# Patient Record
Sex: Male | Born: 1965 | Race: Black or African American | Hispanic: No | Marital: Married | Smoking: Never smoker
Health system: Southern US, Community
[De-identification: ages and names within clinical notes are randomized; demographics above are authoritative.]

## PROBLEM LIST (undated history)

## (undated) DIAGNOSIS — IMO0002 Reserved for concepts with insufficient information to code with codable children: Secondary | ICD-10-CM

## (undated) DIAGNOSIS — G4733 Obstructive sleep apnea (adult) (pediatric): Secondary | ICD-10-CM

## (undated) DIAGNOSIS — K625 Hemorrhage of anus and rectum: Secondary | ICD-10-CM

## (undated) DIAGNOSIS — M7540 Impingement syndrome of unspecified shoulder: Secondary | ICD-10-CM

## (undated) DIAGNOSIS — T4145XA Adverse effect of unspecified anesthetic, initial encounter: Secondary | ICD-10-CM

## (undated) DIAGNOSIS — Z973 Presence of spectacles and contact lenses: Secondary | ICD-10-CM

## (undated) DIAGNOSIS — G479 Sleep disorder, unspecified: Secondary | ICD-10-CM

## (undated) DIAGNOSIS — M1990 Unspecified osteoarthritis, unspecified site: Secondary | ICD-10-CM

## (undated) DIAGNOSIS — S838X9A Sprain of other specified parts of unspecified knee, initial encounter: Secondary | ICD-10-CM

## (undated) DIAGNOSIS — T8859XA Other complications of anesthesia, initial encounter: Secondary | ICD-10-CM

## (undated) DIAGNOSIS — M25819 Other specified joint disorders, unspecified shoulder: Secondary | ICD-10-CM

## (undated) DIAGNOSIS — F32A Depression, unspecified: Secondary | ICD-10-CM

## (undated) DIAGNOSIS — F329 Major depressive disorder, single episode, unspecified: Secondary | ICD-10-CM

## (undated) DIAGNOSIS — F4310 Post-traumatic stress disorder, unspecified: Secondary | ICD-10-CM

## (undated) DIAGNOSIS — G4730 Sleep apnea, unspecified: Secondary | ICD-10-CM

## (undated) DIAGNOSIS — K219 Gastro-esophageal reflux disease without esophagitis: Secondary | ICD-10-CM

## (undated) DIAGNOSIS — K4090 Unilateral inguinal hernia, without obstruction or gangrene, not specified as recurrent: Secondary | ICD-10-CM

## (undated) DIAGNOSIS — F419 Anxiety disorder, unspecified: Secondary | ICD-10-CM

## (undated) DIAGNOSIS — G4700 Insomnia, unspecified: Secondary | ICD-10-CM

## (undated) DIAGNOSIS — T1490XA Injury, unspecified, initial encounter: Secondary | ICD-10-CM

## (undated) DIAGNOSIS — F40240 Claustrophobia: Secondary | ICD-10-CM

## (undated) DIAGNOSIS — I499 Cardiac arrhythmia, unspecified: Secondary | ICD-10-CM

## (undated) DIAGNOSIS — R51 Headache: Secondary | ICD-10-CM

## (undated) HISTORY — PX: APPENDECTOMY: SHX54

## (undated) HISTORY — DX: Hemorrhage of anus and rectum: K62.5

## (undated) HISTORY — PX: OTHER SURGICAL HISTORY: SHX169

## (undated) HISTORY — DX: Obstructive sleep apnea (adult) (pediatric): G47.33

## (undated) HISTORY — DX: Post-traumatic stress disorder, unspecified: F43.10

## (undated) HISTORY — PX: ANKLE RECONSTRUCTION: SHX1151

## (undated) HISTORY — DX: Morbid (severe) obesity due to excess calories: E66.01

## (undated) HISTORY — DX: Sleep apnea, unspecified: G47.30

---

## 1999-07-17 ENCOUNTER — Emergency Department (HOSPITAL_COMMUNITY): Admission: EM | Admit: 1999-07-17 | Discharge: 1999-07-17 | Payer: Self-pay | Admitting: Emergency Medicine

## 1999-07-17 ENCOUNTER — Encounter: Payer: Self-pay | Admitting: Emergency Medicine

## 2001-11-05 ENCOUNTER — Encounter: Payer: Self-pay | Admitting: Cardiology

## 2001-11-05 ENCOUNTER — Ambulatory Visit (HOSPITAL_COMMUNITY): Admission: RE | Admit: 2001-11-05 | Discharge: 2001-11-05 | Payer: Self-pay | Admitting: Cardiology

## 2002-05-18 ENCOUNTER — Emergency Department (HOSPITAL_COMMUNITY): Admission: EM | Admit: 2002-05-18 | Discharge: 2002-05-18 | Payer: Self-pay | Admitting: Emergency Medicine

## 2002-05-18 ENCOUNTER — Encounter: Payer: Self-pay | Admitting: Emergency Medicine

## 2009-07-27 ENCOUNTER — Encounter: Admission: RE | Admit: 2009-07-27 | Discharge: 2009-07-27 | Payer: Self-pay | Admitting: Occupational Medicine

## 2010-04-01 ENCOUNTER — Emergency Department (HOSPITAL_COMMUNITY)
Admission: EM | Admit: 2010-04-01 | Discharge: 2010-04-01 | Payer: Self-pay | Source: Home / Self Care | Admitting: Family Medicine

## 2010-06-11 HISTORY — PX: INGUINAL HERNIA REPAIR: SUR1180

## 2010-08-24 ENCOUNTER — Emergency Department (HOSPITAL_COMMUNITY): Payer: Worker's Compensation

## 2010-08-24 ENCOUNTER — Emergency Department (HOSPITAL_COMMUNITY)
Admission: EM | Admit: 2010-08-24 | Discharge: 2010-08-24 | Disposition: A | Discharge status: Home / Self Care | Payer: Worker's Compensation | Attending: Emergency Medicine | Admitting: Emergency Medicine

## 2010-08-24 DIAGNOSIS — IMO0002 Reserved for concepts with insufficient information to code with codable children: Secondary | ICD-10-CM | POA: Insufficient documentation

## 2010-08-24 DIAGNOSIS — M545 Low back pain, unspecified: Secondary | ICD-10-CM | POA: Insufficient documentation

## 2010-08-24 DIAGNOSIS — M25529 Pain in unspecified elbow: Secondary | ICD-10-CM | POA: Insufficient documentation

## 2010-08-24 DIAGNOSIS — T310 Burns involving less than 10% of body surface: Secondary | ICD-10-CM | POA: Insufficient documentation

## 2010-08-24 DIAGNOSIS — T22229A Burn of second degree of unspecified elbow, initial encounter: Secondary | ICD-10-CM | POA: Insufficient documentation

## 2010-08-24 DIAGNOSIS — X010XXA Exposure to flames in uncontrolled fire, not in building or structure, initial encounter: Secondary | ICD-10-CM | POA: Insufficient documentation

## 2010-08-24 DIAGNOSIS — T24239A Burn of second degree of unspecified lower leg, initial encounter: Secondary | ICD-10-CM | POA: Insufficient documentation

## 2010-08-24 DIAGNOSIS — S335XXA Sprain of ligaments of lumbar spine, initial encounter: Secondary | ICD-10-CM | POA: Insufficient documentation

## 2010-12-12 ENCOUNTER — Ambulatory Visit (INDEPENDENT_AMBULATORY_CARE_PROVIDER_SITE_OTHER): Payer: 59 | Admitting: Pulmonary Disease

## 2010-12-12 ENCOUNTER — Ambulatory Visit (INDEPENDENT_AMBULATORY_CARE_PROVIDER_SITE_OTHER)
Admission: RE | Admit: 2010-12-12 | Discharge: 2010-12-12 | Disposition: A | Discharge status: Home / Self Care | Payer: 59 | Source: Ambulatory Visit | Attending: Pulmonary Disease | Admitting: Pulmonary Disease

## 2010-12-12 ENCOUNTER — Encounter: Payer: Self-pay | Admitting: Pulmonary Disease

## 2010-12-12 DIAGNOSIS — R0989 Other specified symptoms and signs involving the circulatory and respiratory systems: Secondary | ICD-10-CM

## 2010-12-12 DIAGNOSIS — F4310 Post-traumatic stress disorder, unspecified: Secondary | ICD-10-CM

## 2010-12-12 DIAGNOSIS — R0600 Dyspnea, unspecified: Secondary | ICD-10-CM

## 2010-12-12 DIAGNOSIS — R0609 Other forms of dyspnea: Secondary | ICD-10-CM

## 2010-12-12 DIAGNOSIS — J45998 Other asthma: Secondary | ICD-10-CM | POA: Insufficient documentation

## 2010-12-12 HISTORY — DX: Post-traumatic stress disorder, unspecified: F43.10

## 2010-12-12 MED ORDER — LEVALBUTEROL TARTRATE 45 MCG/ACT IN AERO: 2.0000 | INHALATION_SPRAY | Freq: Four times a day (QID) | RESPIRATORY_TRACT | Status: DC | PRN
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
## 2010-12-12 NOTE — Patient Instructions (Signed)
Will arrange for referral to behavioral health Chest xray today Will schedule breathing test (PFT) Xopenex two puffs as needed up to four times per day for cough, wheeze, chest congestion, or shortness of breath Follow up in 3 weeks
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
## 2010-12-13 ENCOUNTER — Telehealth: Payer: Self-pay | Admitting: Pulmonary Disease

## 2010-12-13 ENCOUNTER — Encounter: Payer: Self-pay | Admitting: *Deleted

## 2010-12-13 NOTE — Telephone Encounter (Signed)
[id number]. Referral was sent to [facility] with behavioral health. It states she will check with pt insurance and then call the pt with appt. Carron Curie, CMA

## 2010-12-13 NOTE — Telephone Encounter (Signed)
CXR 12/12/10 reviewed and unremarkable.  Will have my nurse inform pt that chest xray was okay.  No change to current treatment plan.

## 2010-12-13 NOTE — Telephone Encounter (Signed)
[email or web address]  

## 2010-12-13 NOTE — Telephone Encounter (Signed)
I spoke with patient about results and he verbalized understanding and had no questions 

## 2010-12-14 NOTE — Telephone Encounter (Signed)
Pt called back.  Informed him of the above info.  Nothing further needed.  Pt states he will wait for jennifer's call.

## 2010-12-19 ENCOUNTER — Ambulatory Visit (INDEPENDENT_AMBULATORY_CARE_PROVIDER_SITE_OTHER): Payer: 59 | Admitting: Licensed Clinical Social Worker

## 2010-12-19 DIAGNOSIS — F411 Generalized anxiety disorder: Secondary | ICD-10-CM

## 2010-12-19 DIAGNOSIS — F331 Major depressive disorder, recurrent, moderate: Secondary | ICD-10-CM

## 2010-12-19 DIAGNOSIS — F4310 Post-traumatic stress disorder, unspecified: Secondary | ICD-10-CM

## 2011-01-02 ENCOUNTER — Ambulatory Visit: Payer: Self-pay | Admitting: Licensed Clinical Social Worker

## 2011-01-03 ENCOUNTER — Encounter: Payer: Self-pay | Admitting: Pulmonary Disease

## 2011-01-03 ENCOUNTER — Ambulatory Visit (INDEPENDENT_AMBULATORY_CARE_PROVIDER_SITE_OTHER): Payer: 59 | Admitting: Pulmonary Disease

## 2011-01-03 VITALS — BP 120/80 | HR 119 | Temp 97.6°F | Ht 69.0 in | Wt 269.8 lb

## 2011-01-03 DIAGNOSIS — R0609 Other forms of dyspnea: Secondary | ICD-10-CM

## 2011-01-03 DIAGNOSIS — R079 Chest pain, unspecified: Secondary | ICD-10-CM | POA: Insufficient documentation

## 2011-01-03 DIAGNOSIS — R0600 Dyspnea, unspecified: Secondary | ICD-10-CM

## 2011-01-03 DIAGNOSIS — R0989 Other specified symptoms and signs involving the circulatory and respiratory systems: Secondary | ICD-10-CM

## 2011-01-03 LAB — PULMONARY FUNCTION TEST

## 2011-01-03 MED ORDER — AEROCHAMBER MV MISC: Status: DC

## 2011-01-03 MED ORDER — BECLOMETHASONE DIPROPIONATE 80 MCG/ACT IN AERS: 2.0000 | INHALATION_SPRAY | Freq: Two times a day (BID) | RESPIRATORY_TRACT | Status: DC
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
## 2011-01-03 NOTE — Progress Notes (Signed)
PFT done today. 

## 2011-01-03 NOTE — Assessment & Plan Note (Signed)
He has intermittent episodes of chest pain and palpitations.  These could be related to his symptoms of anxiety.  However, he wants to make sure there is no other heart problem.  Will arrange for cardiology evaluation to further assess.

## 2011-01-03 NOTE — Patient Instructions (Signed)
Qvar two puffs twice per day with spacer device, and rinse mouth after each use Xopenex two puffs as needed for cough, wheeze, or chest congestion Will arrange for referral to cardiology Follow up in 2 months
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
## 2011-01-08 ENCOUNTER — Encounter: Payer: Self-pay | Admitting: *Deleted

## 2011-01-08 ENCOUNTER — Encounter: Payer: Self-pay | Admitting: Cardiovascular Disease

## 2011-01-10 ENCOUNTER — Encounter: Payer: Self-pay | Admitting: *Deleted

## 2011-01-10 ENCOUNTER — Ambulatory Visit (INDEPENDENT_AMBULATORY_CARE_PROVIDER_SITE_OTHER): Payer: Worker's Compensation | Admitting: Cardiovascular Disease

## 2011-01-10 DIAGNOSIS — R0602 Shortness of breath: Secondary | ICD-10-CM

## 2011-01-10 DIAGNOSIS — J45909 Unspecified asthma, uncomplicated: Secondary | ICD-10-CM

## 2011-01-10 DIAGNOSIS — R079 Chest pain, unspecified: Secondary | ICD-10-CM

## 2011-01-10 DIAGNOSIS — F4310 Post-traumatic stress disorder, unspecified: Secondary | ICD-10-CM
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
## 2011-01-10 NOTE — Assessment & Plan Note (Signed)
Continieu Skelexin  F/U primary  Still psychological issues going on

## 2011-01-10 NOTE — Assessment & Plan Note (Signed)
Improved no active wheezing F/U Dr Craige Cotta
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
## 2011-01-10 NOTE — Patient Instructions (Signed)
Your physician recommends that you schedule a follow-up appointment in: AS NEEDED PER DR. Eden Emms  Your physician has requested that you have a lexiscan myoview DX 786.50 CHEST PAIN. For further information please visit [email or web address] Please follow instruction sheet, as given.   Your physician has requested that you have an echocardiogram DX 786.05 SOB. Echocardiography is a painless test that uses sound waves to create images of your heart. It provides your doctor with information about the size and shape of your heart and how well your heart's chambers and valves are working. This procedure takes approximately one hour. There are no restrictions for this procedure.

## 2011-01-18 ENCOUNTER — Telehealth: Payer: Self-pay | Admitting: Cardiovascular Disease
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
## 2011-01-23 ENCOUNTER — Other Ambulatory Visit (HOSPITAL_COMMUNITY): Payer: 59 | Admitting: Radiology

## 2011-02-08 ENCOUNTER — Ambulatory Visit: Payer: Self-pay | Admitting: Adult Health

## 2011-02-19 ENCOUNTER — Other Ambulatory Visit: Payer: Self-pay | Admitting: Otolaryngology

## 2011-02-21 ENCOUNTER — Encounter (HOSPITAL_BASED_OUTPATIENT_CLINIC_OR_DEPARTMENT_OTHER): Payer: Self-pay | Admitting: *Deleted

## 2011-02-21 NOTE — Progress Notes (Addendum)
NPO AFTER MN WITH EXCEPTION WATER/ GATORADE UNTIL 0700. PT ARRIVES AT 1100. NEEDS HG AND EKG. WILL DO QVAR INHALER AM OF SURG. W/ SIP OF WATER, IF NEEDED OXYCODONE. WILL BRING RESCUE INHALER.  PT DOES NEED EKG .. CURRENT ONE FOUND IN EPIC 01-10-11. 

## 2011-02-23 ENCOUNTER — Encounter (HOSPITAL_BASED_OUTPATIENT_CLINIC_OR_DEPARTMENT_OTHER): Payer: Self-pay

## 2011-02-23 ENCOUNTER — Encounter (HOSPITAL_BASED_OUTPATIENT_CLINIC_OR_DEPARTMENT_OTHER): Payer: Self-pay | Admitting: Anesthesiology

## 2011-02-23 ENCOUNTER — Encounter (HOSPITAL_BASED_OUTPATIENT_CLINIC_OR_DEPARTMENT_OTHER)
Admission: RE | Disposition: A | Discharge status: Home / Self Care | Payer: Self-pay | Source: Ambulatory Visit | Attending: Specialist

## 2011-02-23 ENCOUNTER — Ambulatory Visit (HOSPITAL_BASED_OUTPATIENT_CLINIC_OR_DEPARTMENT_OTHER)
Admission: RE | Admit: 2011-02-23 | Discharge: 2011-02-23 | Disposition: A | Discharge status: Home / Self Care | Payer: Worker's Compensation | Source: Ambulatory Visit | Attending: Specialist | Admitting: Specialist

## 2011-02-23 ENCOUNTER — Ambulatory Visit (HOSPITAL_BASED_OUTPATIENT_CLINIC_OR_DEPARTMENT_OTHER): Payer: Worker's Compensation | Admitting: Anesthesiology

## 2011-02-23 DIAGNOSIS — S43499A Other sprain of unspecified shoulder joint, initial encounter: Secondary | ICD-10-CM | POA: Insufficient documentation

## 2011-02-23 DIAGNOSIS — M7540 Impingement syndrome of unspecified shoulder: Secondary | ICD-10-CM

## 2011-02-23 DIAGNOSIS — M719 Bursopathy, unspecified: Secondary | ICD-10-CM | POA: Insufficient documentation

## 2011-02-23 DIAGNOSIS — X58XXXA Exposure to other specified factors, initial encounter: Secondary | ICD-10-CM | POA: Insufficient documentation

## 2011-02-23 DIAGNOSIS — Z79899 Other long term (current) drug therapy: Secondary | ICD-10-CM | POA: Insufficient documentation

## 2011-02-23 DIAGNOSIS — M659 Unspecified synovitis and tenosynovitis, unspecified site: Secondary | ICD-10-CM | POA: Insufficient documentation

## 2011-02-23 DIAGNOSIS — J45909 Unspecified asthma, uncomplicated: Secondary | ICD-10-CM | POA: Insufficient documentation

## 2011-02-23 DIAGNOSIS — M67919 Unspecified disorder of synovium and tendon, unspecified shoulder: Secondary | ICD-10-CM | POA: Insufficient documentation

## 2011-02-23 DIAGNOSIS — M25819 Other specified joint disorders, unspecified shoulder: Secondary | ICD-10-CM | POA: Insufficient documentation

## 2011-02-23 HISTORY — DX: Reserved for concepts with insufficient information to code with codable children: IMO0002

## 2011-02-23 HISTORY — DX: Other specified joint disorders, unspecified shoulder: M25.819

## 2011-02-23 HISTORY — DX: Impingement syndrome of unspecified shoulder: M75.40

## 2011-02-23 HISTORY — DX: Cardiac arrhythmia, unspecified: I49.9

## 2011-02-23 HISTORY — PX: SHOULDER ARTHROSCOPY: SHX128

## 2011-02-23 HISTORY — DX: Insomnia, unspecified: G47.00

## 2011-02-23 HISTORY — DX: Anxiety disorder, unspecified: F41.9

## 2011-02-23 HISTORY — DX: Injury, unspecified, initial encounter: T14.90XA

## 2011-02-23 LAB — POCT HEMOGLOBIN-HEMACUE: Hemoglobin: 12.6 g/dL — ABNORMAL LOW (ref 13.0–17.0)

## 2011-02-23 SURGERY — ARTHROSCOPY, SHOULDER
Anesthesia: General | Site: Shoulder | Wound class: Clean

## 2011-02-23 MED ORDER — OXYCODONE-ACETAMINOPHEN 10-325 MG PO TABS: 1.0000 | ORAL_TABLET | Freq: Four times a day (QID) | ORAL | Status: AC | PRN

## 2011-02-23 MED ORDER — LIDOCAINE HCL (CARDIAC) 20 MG/ML IV SOLN
INTRAVENOUS | Status: DC | PRN
  Administered 2011-02-23: 100 mg via INTRAVENOUS

## 2011-02-23 MED ORDER — LIDOCAINE HCL 4 % IJ SOLN
INTRAMUSCULAR | Status: DC | PRN
  Administered 2011-02-23: 4 mL

## 2011-02-23 MED ORDER — FENTANYL CITRATE 0.05 MG/ML IJ SOLN
25.0000 ug | INTRAMUSCULAR | Status: DC | PRN
  Administered 2011-02-23 (×2): 25 ug via INTRAVENOUS

## 2011-02-23 MED ORDER — SODIUM CHLORIDE 0.9 % IR SOLN
Status: DC | PRN
  Administered 2011-02-23: 15:00:00

## 2011-02-23 MED ORDER — ONDANSETRON HCL 4 MG/2ML IJ SOLN
INTRAMUSCULAR | Status: DC | PRN
  Administered 2011-02-23: 4 mg via INTRAVENOUS

## 2011-02-23 MED ORDER — LACTATED RINGERS IV SOLN
INTRAVENOUS | Status: DC
  Administered 2011-02-23 (×2): via INTRAVENOUS

## 2011-02-23 MED ORDER — NEOSTIGMINE METHYLSULFATE 1 MG/ML IJ SOLN
INTRAMUSCULAR | Status: DC | PRN
  Administered 2011-02-23: 5 mg via INTRAVENOUS

## 2011-02-23 MED ORDER — MIDAZOLAM HCL 5 MG/5ML IJ SOLN
INTRAMUSCULAR | Status: DC | PRN
  Administered 2011-02-23: 2 mg via INTRAVENOUS

## 2011-02-23 MED ORDER — KETOROLAC TROMETHAMINE 30 MG/ML IJ SOLN
INTRAMUSCULAR | Status: DC | PRN
  Administered 2011-02-23: 30 mg via INTRAVENOUS

## 2011-02-23 MED ORDER — METAXALONE 800 MG PO TABS: 800.0000 mg | ORAL_TABLET | Freq: Three times a day (TID) | ORAL | Status: AC

## 2011-02-23 MED ORDER — MEPERIDINE HCL 25 MG/ML IJ SOLN: 6.2500 mg | INTRAMUSCULAR | Status: DC | PRN

## 2011-02-23 MED ORDER — SUCCINYLCHOLINE CHLORIDE 20 MG/ML IJ SOLN
INTRAMUSCULAR | Status: DC | PRN
  Administered 2011-02-23: 140 mg via INTRAVENOUS

## 2011-02-23 MED ORDER — CEFAZOLIN SODIUM-DEXTROSE 2-3 GM-% IV SOLR
2.0000 g | INTRAVENOUS | Status: AC
  Administered 2011-02-23: 2 g via INTRAVENOUS

## 2011-02-23 MED ORDER — METAXALONE 800 MG PO TABS: 800.0000 mg | ORAL_TABLET | Freq: Three times a day (TID) | ORAL | Status: DC | PRN

## 2011-02-23 MED ORDER — PROPOFOL 10 MG/ML IV EMUL
INTRAVENOUS | Status: DC | PRN
  Administered 2011-02-23: 300 mg via INTRAVENOUS

## 2011-02-23 MED ORDER — CHLORHEXIDINE GLUCONATE 4 % EX LIQD: 60.0000 mL | Freq: Once | CUTANEOUS | Status: DC

## 2011-02-23 MED ORDER — OXYCODONE-ACETAMINOPHEN 5-325 MG PO TABS
1.0000 | ORAL_TABLET | ORAL | Status: DC | PRN
  Administered 2011-02-23: 1 via ORAL

## 2011-02-23 MED ORDER — PROMETHAZINE HCL 25 MG/ML IJ SOLN: 6.2500 mg | INTRAMUSCULAR | Status: DC | PRN

## 2011-02-23 MED ORDER — DEXAMETHASONE SODIUM PHOSPHATE 4 MG/ML IJ SOLN
INTRAMUSCULAR | Status: DC | PRN
  Administered 2011-02-23: 10 mg via INTRAVENOUS

## 2011-02-23 MED ORDER — GLYCOPYRROLATE 0.2 MG/ML IJ SOLN
INTRAMUSCULAR | Status: DC | PRN
  Administered 2011-02-23: .8 mg via INTRAVENOUS

## 2011-02-23 MED ORDER — FENTANYL CITRATE 0.05 MG/ML IJ SOLN
INTRAMUSCULAR | Status: DC | PRN
  Administered 2011-02-23 (×2): 100 ug via INTRAVENOUS

## 2011-02-23 MED ORDER — OXYCODONE HCL 5 MG PO TABS
5.0000 mg | ORAL_TABLET | Freq: Four times a day (QID) | ORAL | Status: DC | PRN
  Administered 2011-02-23: 5 mg via ORAL

## 2011-02-23 MED ORDER — LACTATED RINGERS IV SOLN: INTRAVENOUS | Status: DC

## 2011-02-23 MED ORDER — BUPIVACAINE-EPINEPHRINE 0.5% -1:200000 IJ SOLN
INTRAMUSCULAR | Status: DC | PRN
  Administered 2011-02-23: 27 mL

## 2011-02-23 MED ORDER — LACTATED RINGERS IV SOLN
INTRAVENOUS | Status: DC
  Administered 2011-02-23: 17:00:00 via INTRAVENOUS

## 2011-02-23 SURGICAL SUPPLY — 67 items
APL SKNCLS STERI-STRIP NONHPOA (GAUZE/BANDAGES/DRESSINGS)
BENZOIN TINCTURE PRP APPL 2/3 (GAUZE/BANDAGES/DRESSINGS) IMPLANT
BLADE 4.2CUDA (BLADE) ×3 IMPLANT
BLADE CUDA SHAVER 3.5 (BLADE) ×3 IMPLANT
BLADE GREAT WHITE 4.2 (BLADE) IMPLANT
BLADE SURG 11 STRL SS (BLADE) ×3 IMPLANT
BNDG COHESIVE 4X5 TAN NS LF (GAUZE/BANDAGES/DRESSINGS) ×3 IMPLANT
CANISTER SUCT LVC 12 LTR MEDI- (MISCELLANEOUS) ×3 IMPLANT
CANNULA ACUFLEX KIT 5X76 (CANNULA) ×3 IMPLANT
CANNULA SHOULDER 7CM (CANNULA) ×3 IMPLANT
CLEANER CAUTERY TIP 5X5 PAD (MISCELLANEOUS) IMPLANT
CLOTH BEACON ORANGE TIMEOUT ST (SAFETY) ×3 IMPLANT
DRAPE LG THREE QUARTER DISP (DRAPES) IMPLANT
DRAPE STERI 35X30 U-POUCH (DRAPES) ×3 IMPLANT
DRAPE U 20/CS (DRAPES) ×6 IMPLANT
DRSG PAD ABDOMINAL 8X10 ST (GAUZE/BANDAGES/DRESSINGS) ×2 IMPLANT
DURAPREP 26ML APPLICATOR (WOUND CARE) ×3 IMPLANT
ELECT MENISCUS 165MM 90D (ELECTRODE) IMPLANT
ELECT NDL TIP 2.8 STRL (NEEDLE) ×1 IMPLANT
ELECT NEEDLE TIP 2.8 STRL (NEEDLE) ×3 IMPLANT
ELECT REM PT RETURN 9FT ADLT (ELECTROSURGICAL) ×3
ELECTRODE REM PT RTRN 9FT ADLT (ELECTROSURGICAL) ×2 IMPLANT
GLOVE BIO SURGEON STRL SZ 6.5 (GLOVE) ×3 IMPLANT
GLOVE INDICATOR 7.0 STRL GRN (GLOVE) ×3 IMPLANT
GLOVE SURG SS PI 8.0 STRL IVOR (GLOVE) ×3 IMPLANT
GOWN STRL NON-REIN LRG LVL3 (GOWN DISPOSABLE) ×5 IMPLANT
NDL 1/2 CIR CATGUT .05X1.09 (NEEDLE) IMPLANT
NDL FILTER BLUNT 18X1 1/2 (NEEDLE) ×1 IMPLANT
NDL SAFETY ECLIPSE 18X1.5 (NEEDLE) ×2 IMPLANT
NDL SUT 6 .5 CRC .975X.05 MAYO (NEEDLE) IMPLANT
NEEDLE 1/2 CIR CATGUT .05X1.09 (NEEDLE) IMPLANT
NEEDLE FILTER BLUNT 18X 1/2SAF (NEEDLE) ×1
NEEDLE FILTER BLUNT 18X1 1/2 (NEEDLE) ×2 IMPLANT
NEEDLE HYPO 18GX1.5 SHARP (NEEDLE) ×3
NEEDLE MAYO TAPER (NEEDLE)
NEEDLE SPNL 18GX3.5 QUINCKE PK (NEEDLE) ×3 IMPLANT
NS IRRIG 500ML POUR BTL (IV SOLUTION) IMPLANT
PACK BASIN DAY SURGERY FS (CUSTOM PROCEDURE TRAY) ×3 IMPLANT
PACK SHOULDER CUSTOM OPM052 (CUSTOM PROCEDURE TRAY) ×3 IMPLANT
PAD CLEANER CAUTERY TIP 5X5 (MISCELLANEOUS)
SET ARTHROSCOPY TUBING (MISCELLANEOUS) ×3
SET ARTHROSCOPY TUBING LN (MISCELLANEOUS) ×2 IMPLANT
SLING ARM FOAM STRAP LRG (SOFTGOODS) IMPLANT
SLING ARM FOAM STRAP XLG (SOFTGOODS) ×2 IMPLANT
SPONGE GAUZE 4X4 12PLY (GAUZE/BANDAGES/DRESSINGS) ×2 IMPLANT
STAPLER VISISTAT 35W (STAPLE) IMPLANT
STRIP CLOSURE SKIN 1/2X4 (GAUZE/BANDAGES/DRESSINGS) IMPLANT
SUT BONE WAX W31G (SUTURE) IMPLANT
SUT ETHIBOND 1 OS 2 18 CR3 (SUTURE) IMPLANT
SUT ETHIBOND 2 OS 4 DA (SUTURE) IMPLANT
SUT ETHILON 4 0 PS 2 18 (SUTURE) ×3 IMPLANT
SUT FIBERWIRE #2 38 REV NDL BL (SUTURE)
SUT MON AB 4-0 PC3 18 (SUTURE) IMPLANT
SUT PDS AB 0 CT 36 (SUTURE) IMPLANT
SUT PROLENE 3 0 PS 2 (SUTURE) IMPLANT
SUT VIC AB 1 CT1 36 (SUTURE) IMPLANT
SUT VIC AB 1-0 CT2 27 (SUTURE) IMPLANT
SUT VIC AB 2-0 CT2 27 (SUTURE) ×3 IMPLANT
SUT VICRYL 0 UR6 27IN ABS (SUTURE) ×6 IMPLANT
SUT VICRYL 0-0 OS 2 NEEDLE (SUTURE) IMPLANT
SUTURE FIBERWR#2 38 REV NDL BL (SUTURE) IMPLANT
SYRINGE 10CC LL (SYRINGE) ×3 IMPLANT
TAPE CLOTH SURG 6X10 WHT LF (GAUZE/BANDAGES/DRESSINGS) ×2 IMPLANT
TAPE HYPAFIX 6X30 (GAUZE/BANDAGES/DRESSINGS) ×3 IMPLANT
TOWEL OR 17X24 6PK STRL BLUE (TOWEL DISPOSABLE) ×6 IMPLANT
WAND 90 DEG TURBOVAC W/CORD (SURGICAL WAND) ×2 IMPLANT
WATER STERILE IRR 500ML POUR (IV SOLUTION) ×3 IMPLANT

## 2011-02-23 NOTE — Progress Notes (Signed)
Reported to M. Leyton Brownlee, RN as caregiver 
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
## 2011-02-23 NOTE — Progress Notes (Signed)
Glasses returned to pt 

## 2011-02-23 NOTE — Anesthesia Procedure Notes (Addendum)
Procedure Name: Intubation Date/Time: 02/23/2011 1:35 PM Performed by: Huel Coventry Pre-anesthesia Checklist: Patient identified, Emergency Drugs available, Suction available and Patient being monitored Patient Re-evaluated:Patient Re-evaluated prior to inductionOxygen Delivery Method: Circle System Utilized Preoxygenation: Pre-oxygenation with 100% oxygen Intubation Type: IV induction Ventilation: Mask ventilation without difficulty Grade View: Grade I Tube type: Oral Number of attempts: 3 Airway Equipment and Method: stylet and oral airway Placement Confirmation: ETT inserted through vocal cords under direct vision,  positive ETCO2 and breath sounds checked- equal and bilateral Secured at: 22 cm Tube secured with: Tape Dental Injury: Teeth and Oropharynx as per pre-operative assessment  Future Recommendations: Recommend- awake intubation Comments: Easy mask airway, DLx1 with mac 3 unable to see beyond epiglottis.  DL x2 with Miller3, unable to lift enough to see glottic opening. Dr. Tiffany Kocher DLx3 with Hyacinth Meeker 3 GR 1 view, AOI, +ETCO2, +EBBS.

## 2011-02-23 NOTE — Transfer of Care (Signed)
Immediate Anesthesia Transfer of Care Note  Patient: [name]  Procedure(s) Performed:  ARTHROSCOPY SHOULDER - Labral debridement  Patient Location: PACU  Anesthesia Type: General  Level of Consciousness: drowsy, arouses to name and follows commands  Airway & Oxygen Therapy: Patient Spontanous Breathing and Patient connected to face mask oxygen  Post-op Assessment: Report given to PACU RN and Post -op Vital signs reviewed and stable  Post vital signs: Reviewed and stable  Complications: No apparent anesthesia complications

## 2011-02-23 NOTE — Interval H&P Note (Signed)
History and Physical Interval Note:  02/23/2011 1:18 PM  [name]  has presented today for surgery, with the diagnosis of impingement syndrome of left shoulder  The various methods of treatment have been discussed with the patient and family. After consideration of risks, benefits and other options for treatment, the patient has consented to  Procedure(s): SHOULDER ARTHROSCOPY WITH SUBACROMIAL DECOMPRESSION as a surgical intervention .  The patients' history has been reviewed, patient examined, no change in status, stable for surgery.  I have reviewed the patients' chart and labs.  Questions were answered to the patient's satisfaction.     Marcel Gary C

## 2011-02-23 NOTE — Progress Notes (Signed)
Report given to D. Freida Busman, RN.
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
## 2011-02-23 NOTE — Brief Op Note (Signed)
02/23/2011  2:28 PM  PATIENT:  [name]  45 y.o. male  PRE-OPERATIVE DIAGNOSIS:  impingement syndrome of left shoulder  POST-OPERATIVE DIAGNOSIS:  impingement syndrome of left shoulder  PROCEDURE:  Procedure(s): SHOULDER ARTHROSCOPY WITH SUBACROMIAL DECOMPRESSION ARTHROSCOPY SHOULDER  SURGEON:  Surgeon(s): Javier Docker  PHYSICIAN ASSISTANT:   ASSISTANTS: strader   ANESTHESIA:   general  EBL:  Total I/O In: 200 [I.V.:200] Out: -   BLOOD ADMINISTERED:none  DRAINS: none   LOCAL MEDICATIONS USED:  MARCAINE 25CC  SPECIMEN:  No Specimen  DISPOSITION OF SPECIMEN:  N/A  COUNTS:  YES  TOURNIQUET:  * No tourniquets in log *  DICTATION: .Other Dictation: Dictation Number [id number]  PLAN OF CARE: Discharge to home after PACU  PATIENT DISPOSITION:  PACU - hemodynamically stable.   Delay start of Pharmacological VTE agent (>24hrs) due to surgical blood loss or risk of bleeding:  {YES/NO/NOT APPLICABLE:20182

## 2011-02-23 NOTE — H&P (View-Only) (Signed)
NPO AFTER MN WITH EXCEPTION WATER/ GATORADE UNTIL 0700. PT ARRIVES AT 1100. NEEDS HG AND EKG. WILL DO QVAR INHALER AM OF SURG. W/ SIP OF WATER, IF NEEDED OXYCODONE. WILL BRING RESCUE INHALER.  PT DOES NEED EKG .Marland Kitchen CURRENT ONE FOUND IN EPIC 01-10-11.

## 2011-02-23 NOTE — Anesthesia Preprocedure Evaluation (Addendum)
Anesthesia Evaluation  Patient identified by MRN, date of birth, ID band Patient awake    Reviewed: Allergy & Precautions, H&P , NPO status , Patient's Chart, lab work & pertinent test results, reviewed documented beta blocker date and time   Airway Mallampati: II TM Distance: >3 FB Neck ROM: full    Dental No notable dental hx.    Pulmonary neg pulmonary ROS, asthma ,  clear to auscultation  Pulmonary exam normal       Cardiovascular Exercise Tolerance: Good neg cardio ROS - dysrhythmias regular Normal    Neuro/Psych Negative Neurological ROS  Negative Psych ROS   GI/Hepatic negative GI ROS, Neg liver ROS,   Endo/Other  Negative Endocrine ROS  Renal/GU negative Renal ROS  Genitourinary negative   Musculoskeletal   Abdominal   Peds  Hematology negative hematology ROS (+)   Anesthesia Other Findings   Reproductive/Obstetrics negative OB ROS                          Anesthesia Physical Anesthesia Plan  ASA: II  Anesthesia Plan: General   Post-op Pain Management:    Induction:   Airway Management Planned: Oral ETT  Additional Equipment:   Intra-op Plan:   Post-operative Plan:   Informed Consent: I have reviewed the patients History and Physical, chart, labs and discussed the procedure including the risks, benefits and alternatives for the proposed anesthesia with the patient or authorized representative who has indicated his/her understanding and acceptance.   Dental Advisory Given  Plan Discussed with: CRNA  Anesthesia Plan Comments:       Anesthesia Quick Evaluation

## 2011-02-26 NOTE — Anesthesia Postprocedure Evaluation (Signed)
  Anesthesia Post-op Note  Patient: [name]  Procedure(s) Performed:  ARTHROSCOPY SHOULDER - Labral debridement  Patient Location: PACU  Anesthesia Type: General  Level of Consciousness: awake and alert   Airway and Oxygen Therapy: Patient Spontanous Breathing  Post-op Pain: mild  Post-op Assessment: Post-op Vital signs reviewed, Patient's Cardiovascular Status Stable, Respiratory Function Stable, Patent Airway and No signs of Nausea or vomiting  Post-op Vital Signs: stable  Complications: No apparent anesthesia complications

## 2011-03-01 ENCOUNTER — Ambulatory Visit (INDEPENDENT_AMBULATORY_CARE_PROVIDER_SITE_OTHER): Payer: Worker's Compensation | Admitting: Pulmonary Disease

## 2011-03-01 ENCOUNTER — Encounter (HOSPITAL_BASED_OUTPATIENT_CLINIC_OR_DEPARTMENT_OTHER): Payer: Self-pay | Admitting: Specialist

## 2011-03-01 DIAGNOSIS — R079 Chest pain, unspecified: Secondary | ICD-10-CM

## 2011-03-01 DIAGNOSIS — J45909 Unspecified asthma, uncomplicated: Secondary | ICD-10-CM

## 2011-03-01 DIAGNOSIS — R0609 Other forms of dyspnea: Secondary | ICD-10-CM

## 2011-03-01 DIAGNOSIS — R0683 Snoring: Secondary | ICD-10-CM

## 2011-03-01 DIAGNOSIS — R0989 Other specified symptoms and signs involving the circulatory and respiratory systems: Secondary | ICD-10-CM

## 2011-03-01 MED ORDER — MOMETASONE FURO-FORMOTEROL FUM 100-5 MCG/ACT IN AERO: 2.0000 | INHALATION_SPRAY | Freq: Two times a day (BID) | RESPIRATORY_TRACT | Status: DC

## 2011-03-01 NOTE — Assessment & Plan Note (Addendum)
He has improvement with inhaler therapy, but still has persistent symptoms.  Will give trial of dulera.  He is to continue xopenex.  He will get his flu shot from his employer.

## 2011-03-01 NOTE — Assessment & Plan Note (Signed)
He has symptoms suggestive of sleep apnea.  He would like to see how he does with change to his inhaler regimen, and then reassess whether he needs evaluation for sleep apnea.
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
## 2011-03-01 NOTE — Assessment & Plan Note (Signed)
Advised him to contact Dr. Fabio Bering office to reschedule cardiac testing.

## 2011-03-01 NOTE — Patient Instructions (Signed)
Stop Qvar Dulera two puffs twice per day, and rinse mouth after each use Xopenex two puffs as needed for cough, wheeze, chest congestion, or shortness of breath Follow up in 3 months

## 2011-03-14 ENCOUNTER — Encounter (HOSPITAL_COMMUNITY): Payer: Self-pay | Admitting: Pharmacy Technician

## 2011-03-15 ENCOUNTER — Other Ambulatory Visit: Payer: Self-pay | Admitting: Otolaryngology

## 2011-03-20 ENCOUNTER — Telehealth: Payer: Self-pay | Admitting: Pulmonary Disease

## 2011-03-20 MED ORDER — HYDROCOD POLST-CHLORPHEN POLST 10-8 MG/5ML PO LQCR: ORAL | Status: DC

## 2011-03-20 NOTE — Telephone Encounter (Signed)
Per SN---call in tussionex  #4oz  1 tsp po every 12 hours prn cough and use mucinex 600mg   2 po bid with plenty of fluids.  Pt will need to call back on 1/15 to discuss this letter he is needing.  thanks

## 2011-03-20 NOTE — Telephone Encounter (Signed)
lmomtcb x1 

## 2011-03-20 NOTE — Telephone Encounter (Signed)
I spoke with pt and is aware of SN recs and rx ahs been called in. Will forward not to VS regarding the letter. Please advise thanks

## 2011-03-20 NOTE — Telephone Encounter (Signed)
[phone] PT RETURNED CALL  Leanora Ivanoff

## 2011-03-20 NOTE — Telephone Encounter (Signed)
I spoke with pt and he states his cough is still no better from when he saw VS on 03/01/11. Pt states he tried to cough it up but then it goes back down his throat and it is worse at night. Pt is requesting to have cough syrup called into the pharmacy for him. Also pt is wanting to know if Dr. Craige Cotta will write him a letter stating that his worker's comp covers his office visits that he has. I advised him Dr. Craige Cotta is not in the office currently but will forward to him for when he returns. Also since VS is not in will forward to doc of the day regarding pt's cough syrup. Please advise Dr. Kriste Basque, thanks  No Known Allergies

## 2011-03-21 ENCOUNTER — Other Ambulatory Visit: Payer: Self-pay

## 2011-03-21 ENCOUNTER — Encounter (HOSPITAL_COMMUNITY): Payer: Self-pay

## 2011-03-21 ENCOUNTER — Encounter: Payer: Self-pay | Admitting: Cardiovascular Disease

## 2011-03-21 ENCOUNTER — Ambulatory Visit: Payer: Self-pay | Admitting: Cardiovascular Disease

## 2011-03-21 ENCOUNTER — Ambulatory Visit (INDEPENDENT_AMBULATORY_CARE_PROVIDER_SITE_OTHER): Payer: Worker's Compensation | Admitting: Cardiovascular Disease

## 2011-03-21 ENCOUNTER — Encounter (HOSPITAL_COMMUNITY)
Admission: RE | Admit: 2011-03-21 | Discharge: 2011-03-21 | Disposition: A | Discharge status: Home / Self Care | Payer: Worker's Compensation | Source: Ambulatory Visit | Attending: Specialist | Admitting: Specialist

## 2011-03-21 DIAGNOSIS — R0609 Other forms of dyspnea: Secondary | ICD-10-CM

## 2011-03-21 DIAGNOSIS — J45909 Unspecified asthma, uncomplicated: Secondary | ICD-10-CM

## 2011-03-21 DIAGNOSIS — R0600 Dyspnea, unspecified: Secondary | ICD-10-CM

## 2011-03-21 DIAGNOSIS — R079 Chest pain, unspecified: Secondary | ICD-10-CM

## 2011-03-21 HISTORY — DX: Sleep disorder, unspecified: G47.9

## 2011-03-21 HISTORY — DX: Headache: R51

## 2011-03-21 LAB — BASIC METABOLIC PANEL
BUN: 13 mg/dL (ref 6–23)
CO2: 24 mEq/L (ref 19–32)
Calcium: 9 mg/dL (ref 8.4–10.5)
Chloride: 104 mEq/L (ref 96–112)
Creatinine, Ser: 1.01 mg/dL (ref 0.50–1.35)
GFR calc Af Amer: >90 mL/min (ref >90)
GFR calc non Af Amer: 89 mL/min — ABNORMAL LOW (ref >90)
Glucose, Bld: 86 mg/dL (ref 70–99)
Potassium: 3.9 mEq/L (ref 3.5–5.1)
Sodium: 136 mEq/L (ref 135–145)

## 2011-03-21 LAB — HEMOGLOBIN AND HEMATOCRIT, BLOOD
HCT: 43.7 % (ref 39.0–52.0)
Hemoglobin: 15 g/dL (ref 13.0–17.0)

## 2011-03-21 LAB — SURGICAL PCR SCREEN
MRSA, PCR: NEGATIVE
Staphylococcus aureus: POSITIVE — AB

## 2011-03-21 MED ORDER — CHLORHEXIDINE GLUCONATE 4 % EX LIQD: 60.0000 mL | Freq: Once | CUTANEOUS | Status: DC
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
## 2011-03-21 NOTE — Patient Instructions (Signed)
20 [name]  03/21/2011   Your procedure is scheduled on:  03/29/11  Report to [facility] at 10:00 AM.  Call this number if you have problems the morning of surgery: [phone]   Remember:   Do not eat food:After Midnight.  May have clear liquids: up to 4 Hours before arrival.(6:30 AM)  Clear liquids include soda, tea, black coffee, apple or grape juice, broth.  Take these medicines the morning of surgery with A SIP OF WATER: SKELAXIN / DULERA / PERCOCET AND XOPENEX IF NEEDED   Do not wear jewelry, make-up or nail polish.  Do not wear lotions, powders, or perfumes. You may wear deodorant.  Do not shave 48 hours prior to surgery.  Do not bring valuables to the hospital.  Contacts, dentures or bridgework may not be worn into surgery.  Leave suitcase in the car. After surgery it may be brought to your room.  For patients admitted to the hospital, checkout time is 11:00 AM the day of discharge.   Patients discharged the day of surgery will not be allowed to drive home.  Name and phone number of your driver:   Special Instructions: CHG Shower Use Special Wash: 1/2 bottle night before surgery and 1/2 bottle morning of surgery.   Please read over the following fact sheets that you were given: MRSA Information

## 2011-03-21 NOTE — Assessment & Plan Note (Signed)
Atypical but persistant. Normal ECG.  Ortho keeps sending him here for clearence but WC and [employer] wont pay for testing.  Will try to reorder myovue ( cannot walk on ETT due to knee injury) and echo for dyspnea.

## 2011-03-21 NOTE — Assessment & Plan Note (Signed)
No wheezing today continue inhalers  PFT 01/03/11>>FEV1 3.79(77%), FEV1% 87, TLC 5.69(85%), DLCO 78%, positive BD response.

## 2011-03-21 NOTE — Patient Instructions (Signed)
Your physician recommends that you schedule a follow-up appointment in: AS NEEDED Your physician recommends that you continue on your current medications as directed. Please refer to the Current Medication list given to you today. Your physician has requested that you have an echocardiogram. Echocardiography is a painless test that uses sound waves to create images of your heart. It provides your doctor with information about the size and shape of your heart and how well your heart's chambers and valves are working. This procedure takes approximately one hour. There are no restrictions for this procedure. DX DYSPNEA PRE OP  ASAP Your physician has requested that you have a lexiscan myoview. For further information please visit [email or web address] Please follow instruction sheet, as given. DX CHEST PAIN PRE OP  ASAP

## 2011-03-22 ENCOUNTER — Ambulatory Visit (HOSPITAL_BASED_OUTPATIENT_CLINIC_OR_DEPARTMENT_OTHER): Payer: Worker's Compensation | Admitting: Radiology

## 2011-03-22 DIAGNOSIS — R0609 Other forms of dyspnea: Secondary | ICD-10-CM

## 2011-03-22 DIAGNOSIS — R0989 Other specified symptoms and signs involving the circulatory and respiratory systems: Secondary | ICD-10-CM

## 2011-03-22 DIAGNOSIS — R0600 Dyspnea, unspecified: Secondary | ICD-10-CM

## 2011-03-26 ENCOUNTER — Ambulatory Visit (HOSPITAL_COMMUNITY): Payer: Worker's Compensation | Attending: Internal Medicine | Admitting: Radiology

## 2011-03-26 DIAGNOSIS — R079 Chest pain, unspecified: Secondary | ICD-10-CM | POA: Insufficient documentation

## 2011-03-26 DIAGNOSIS — R0989 Other specified symptoms and signs involving the circulatory and respiratory systems: Secondary | ICD-10-CM | POA: Insufficient documentation

## 2011-03-26 DIAGNOSIS — J45909 Unspecified asthma, uncomplicated: Secondary | ICD-10-CM | POA: Insufficient documentation

## 2011-03-26 DIAGNOSIS — Z8249 Family history of ischemic heart disease and other diseases of the circulatory system: Secondary | ICD-10-CM | POA: Insufficient documentation

## 2011-03-26 DIAGNOSIS — R0609 Other forms of dyspnea: Secondary | ICD-10-CM | POA: Insufficient documentation

## 2011-03-26 DIAGNOSIS — Z01810 Encounter for preprocedural cardiovascular examination: Secondary | ICD-10-CM | POA: Insufficient documentation

## 2011-03-26 MED ORDER — TECHNETIUM TC 99M TETROFOSMIN IV KIT
33.0000 | PACK | Freq: Once | INTRAVENOUS | Status: AC | PRN
  Administered 2011-03-26: 33 via INTRAVENOUS

## 2011-03-26 MED ORDER — REGADENOSON 0.4 MG/5ML IV SOLN
0.4000 mg | Freq: Once | INTRAVENOUS | Status: AC
  Administered 2011-03-26: 0.4 mg via INTRAVENOUS

## 2011-03-26 NOTE — Progress Notes (Signed)
[facility] SITE 3 NUCLEAR MED [address] [place] [address] [phone]  Cardiology Nuclear Med Study  [name] is a 46 y.o. male [id number] Dec 18, 1965   Nuclear Med Background Indication for Stress Test:  Evaluation for Ischemia and Pending Surgical Clearance: Knee Surgery 03/29/11- Dr. Shelle Iron History:  Asthma and '03 Myocardial Perfusion Study: EF 52% (-) ischemia Cardiac Risk Factors: Family History - CAD  Symptoms:  Chest Pain and DOE   Nuclear Pre-Procedure Caffeine/Decaff Intake:  None NPO After: 10:00pm   Lungs:  clear IV 0.9% NS with Angio Cath:  20g  IV Site: R Antecubital  IV Started by:  Stanton Kidney, EMT-P  Chest Size (in):  52 Cup Size: n/a  Height: 5\' 9"  (1.753 m)  Weight:  278 lb (126.1 kg)  BMI:  Body mass index is 41.05 kg/(m^2). Tech Comments:  30    Nuclear Med Study 1 or 2 day study: 2 day  Stress Test Type:  Eugenie Birks  Reading MD: Dietrich Pates, MD  Order Authorizing Provider:  P.Nishan  Resting Radionuclide: Technetium 86m Tetrofosmin  Resting Radionuclide Dose: 33.0 mCi on 03/22/11   Stress Radionuclide:  Technetium 43m Tetrofosmin  Stress Radionuclide Dose: 33.0 mCi on 03/26/11           Stress Protocol Rest HR: 72 Stress HR: 110  Rest BP: 111/69 Stress BP: 122/73  Exercise Time (min): n/a METS: n/a   Predicted Max HR: 177 bpm % Max HR: 62.15 bpm Rate Pressure Product: 21308   Dose of Adenosine (mg):  n/a Dose of Lexiscan: 0.4 mg  Dose of Atropine (mg): n/a Dose of Dobutamine: n/a mcg/kg/min (at max HR)  Stress Test Technologist: Milana Na, EMT-P  Nuclear Technologist:  Domenic Polite, CNMT     Rest Procedure:  Myocardial perfusion imaging was performed at rest 45 minutes following the intravenous administration of Technetium 94m Tetrofosmin. Rest ECG: NSR  Stress Procedure:  The patient received IV Lexiscan 0.4 mg over 15-seconds.  Technetium 57m Tetrofosmin injected at 30-seconds.  There were no  significant changes with Lexiscan.  Quantitative spect images were obtained after a 45 minute delay. Stress ECG: No significant change from baseline ECG  QPS Raw Data Images:  Patient motion noted; appropriate software correction applied. Stress Images:  Normal homogeneous uptake in all areas of the myocardium. Rest Images:  Normal homogeneous uptake in all areas of the myocardium. Subtraction (SDS):  No evidence of ischemia. Transient Ischemic Dilatation (Normal <1.22):  1.05 Lung/Heart Ratio (Normal <0.45):  0.34  Quantitative Gated Spect Images QGS EDV:  128 ml QGS ESV:  57 ml QGS cine images:  NL LV Function; NL Wall Motion QGS EF: 56%  Impression Exercise Capacity:  Lexiscan with no exercise. BP Response:  Normal blood pressure response. Clinical Symptoms:  No chest pain. ECG Impression:  No significant ST segment change suggestive of ischemia. Comparison with Prior Nuclear Study:No change from previous study.  Overall Impression:  Normal stress nuclear study. Dietrich Pates 5:35 PM

## 2011-03-27 ENCOUNTER — Telehealth: Payer: Self-pay | Admitting: *Deleted

## 2011-03-27 NOTE — Telephone Encounter (Signed)
IS IT  OKAY FOR PT TO PROCEED WITH KNEE SURGERY ECHO AND MYOVIEW WERE NORMAL ?

## 2011-03-27 NOTE — Telephone Encounter (Signed)
OK to proceed with surgery

## 2011-03-28 NOTE — Telephone Encounter (Signed)
NOTE FAXED TO [facility] FOR SURGERY./CY

## 2011-03-28 NOTE — Pre-Procedure Instructions (Signed)
CARDIAC CLEARANCE NOTE DR Eden Emms ON CHART

## 2011-03-29 ENCOUNTER — Encounter (HOSPITAL_COMMUNITY): Payer: Self-pay | Admitting: *Deleted

## 2011-03-29 ENCOUNTER — Ambulatory Visit (HOSPITAL_COMMUNITY)
Admission: RE | Admit: 2011-03-29 | Discharge: 2011-03-29 | Disposition: A | Discharge status: Home / Self Care | Payer: Worker's Compensation | Source: Ambulatory Visit | Attending: Specialist | Admitting: Specialist

## 2011-03-29 ENCOUNTER — Encounter (HOSPITAL_COMMUNITY): Payer: Self-pay

## 2011-03-29 ENCOUNTER — Ambulatory Visit (HOSPITAL_COMMUNITY): Payer: Worker's Compensation | Admitting: *Deleted

## 2011-03-29 ENCOUNTER — Encounter (HOSPITAL_COMMUNITY)
Admission: RE | Disposition: A | Discharge status: Home / Self Care | Payer: Self-pay | Source: Ambulatory Visit | Attending: Specialist

## 2011-03-29 DIAGNOSIS — M899 Disorder of bone, unspecified: Secondary | ICD-10-CM | POA: Insufficient documentation

## 2011-03-29 DIAGNOSIS — M659 Unspecified synovitis and tenosynovitis, unspecified site: Secondary | ICD-10-CM | POA: Insufficient documentation

## 2011-03-29 DIAGNOSIS — M2240 Chondromalacia patellae, unspecified knee: Secondary | ICD-10-CM | POA: Insufficient documentation

## 2011-03-29 DIAGNOSIS — S83209A Unspecified tear of unspecified meniscus, current injury, unspecified knee, initial encounter: Secondary | ICD-10-CM

## 2011-03-29 DIAGNOSIS — J45909 Unspecified asthma, uncomplicated: Secondary | ICD-10-CM | POA: Insufficient documentation

## 2011-03-29 DIAGNOSIS — S83289A Other tear of lateral meniscus, current injury, unspecified knee, initial encounter: Secondary | ICD-10-CM | POA: Insufficient documentation

## 2011-03-29 HISTORY — PX: KNEE ARTHROSCOPY: SHX127

## 2011-03-29 SURGERY — ARTHROSCOPY, KNEE
Anesthesia: General | Site: Knee | Laterality: Left | Wound class: Clean

## 2011-03-29 MED ORDER — BUPIVACAINE-EPINEPHRINE 0.5% -1:200000 IJ SOLN
INTRAMUSCULAR | Status: AC
  Filled 2011-03-29: qty 1

## 2011-03-29 MED ORDER — OXYCODONE-ACETAMINOPHEN 7.5-325 MG PO TABS: 1.0000 | ORAL_TABLET | ORAL | Status: AC | PRN

## 2011-03-29 MED ORDER — OXYCODONE-ACETAMINOPHEN 5-325 MG PO TABS
ORAL_TABLET | ORAL | Status: AC
  Filled 2011-03-29: qty 2

## 2011-03-29 MED ORDER — ONDANSETRON HCL 4 MG PO TABS
4.0000 mg | ORAL_TABLET | Freq: Four times a day (QID) | ORAL | Status: DC | PRN
  Filled 2011-03-29: qty 1

## 2011-03-29 MED ORDER — PROPOFOL 10 MG/ML IV BOLUS
INTRAVENOUS | Status: DC | PRN
  Administered 2011-03-29: 200 mg via INTRAVENOUS

## 2011-03-29 MED ORDER — MIDAZOLAM HCL 5 MG/5ML IJ SOLN
INTRAMUSCULAR | Status: DC | PRN
  Administered 2011-03-29: 2 mg via INTRAVENOUS

## 2011-03-29 MED ORDER — PROMETHAZINE HCL 25 MG/ML IJ SOLN: 6.2500 mg | INTRAMUSCULAR | Status: DC | PRN

## 2011-03-29 MED ORDER — LACTATED RINGERS IV SOLN
INTRAVENOUS | Status: DC
  Administered 2011-03-29: 14:00:00 via INTRAVENOUS

## 2011-03-29 MED ORDER — CEFAZOLIN SODIUM-DEXTROSE 2-3 GM-% IV SOLR
2.0000 g | INTRAVENOUS | Status: AC
  Administered 2011-03-29: 2 g via INTRAVENOUS

## 2011-03-29 MED ORDER — OXYCODONE-ACETAMINOPHEN 5-325 MG PO TABS
1.0000 | ORAL_TABLET | ORAL | Status: DC | PRN
Start: 2011-03-29 — End: 2011-03-29
  Administered 2011-03-29: 1.5 via ORAL

## 2011-03-29 MED ORDER — ACETAMINOPHEN 10 MG/ML IV SOLN
INTRAVENOUS | Status: AC
  Filled 2011-03-29: qty 100

## 2011-03-29 MED ORDER — ONDANSETRON HCL 4 MG/2ML IJ SOLN: 4.0000 mg | Freq: Four times a day (QID) | INTRAMUSCULAR | Status: DC | PRN

## 2011-03-29 MED ORDER — FENTANYL CITRATE 0.05 MG/ML IJ SOLN: 25.0000 ug | INTRAMUSCULAR | Status: DC | PRN

## 2011-03-29 MED ORDER — LACTATED RINGERS IR SOLN
Status: DC | PRN
  Administered 2011-03-29: 3000 mL

## 2011-03-29 MED ORDER — BUPIVACAINE-EPINEPHRINE 0.5% -1:200000 IJ SOLN
INTRAMUSCULAR | Status: DC | PRN
  Administered 2011-03-29: 24 mL

## 2011-03-29 MED ORDER — LACTATED RINGERS IV SOLN
INTRAVENOUS | Status: DC
  Administered 2011-03-29: 1000 mL via INTRAVENOUS

## 2011-03-29 MED ORDER — LIDOCAINE HCL (CARDIAC) 20 MG/ML IV SOLN
INTRAVENOUS | Status: DC | PRN
  Administered 2011-03-29: 100 mg via INTRAVENOUS

## 2011-03-29 MED ORDER — ACETAMINOPHEN 10 MG/ML IV SOLN
INTRAVENOUS | Status: DC | PRN
  Administered 2011-03-29: 1000 mg via INTRAVENOUS

## 2011-03-29 MED ORDER — METAXALONE 800 MG PO TABS: 800.0000 mg | ORAL_TABLET | Freq: Three times a day (TID) | ORAL | Status: AC

## 2011-03-29 MED ORDER — METOCLOPRAMIDE HCL 5 MG PO TABS
5.0000 mg | ORAL_TABLET | Freq: Three times a day (TID) | ORAL | Status: DC | PRN
  Filled 2011-03-29: qty 2

## 2011-03-29 MED ORDER — CEFAZOLIN SODIUM-DEXTROSE 2-3 GM-% IV SOLR
INTRAVENOUS | Status: AC
  Filled 2011-03-29: qty 50

## 2011-03-29 MED ORDER — METOCLOPRAMIDE HCL 5 MG/ML IJ SOLN: 5.0000 mg | Freq: Three times a day (TID) | INTRAMUSCULAR | Status: DC | PRN

## 2011-03-29 MED ORDER — FENTANYL CITRATE 0.05 MG/ML IJ SOLN
INTRAMUSCULAR | Status: DC | PRN
  Administered 2011-03-29 (×3): 50 ug via INTRAVENOUS
  Administered 2011-03-29: 100 ug via INTRAVENOUS

## 2011-03-29 MED ORDER — ONDANSETRON HCL 4 MG/2ML IJ SOLN
INTRAMUSCULAR | Status: DC | PRN
  Administered 2011-03-29: 4 mg via INTRAVENOUS

## 2011-03-29 SURGICAL SUPPLY — 18 items
BANDAGE ELASTIC 6 VELCRO ST LF (GAUZE/BANDAGES/DRESSINGS) ×1 IMPLANT
BLADE 4.2CUDA (BLADE) IMPLANT
BLADE CUDA SHAVER 3.5 (BLADE) ×2 IMPLANT
CLOTH BEACON ORANGE TIMEOUT ST (SAFETY) ×2 IMPLANT
DRSG EMULSION OIL 3X3 NADH (GAUZE/BANDAGES/DRESSINGS) ×2 IMPLANT
DURAPREP 26ML APPLICATOR (WOUND CARE) ×2 IMPLANT
GAUZE SPONGE 4X4 12PLY STRL LF (GAUZE/BANDAGES/DRESSINGS) ×1 IMPLANT
GLOVE BIOGEL PI IND STRL 8 (GLOVE) ×1 IMPLANT
GLOVE BIOGEL PI INDICATOR 8 (GLOVE) ×1
GLOVE SURG SS PI 8.0 STRL IVOR (GLOVE) ×4 IMPLANT
MANIFOLD NEPTUNE II (INSTRUMENTS) ×4 IMPLANT
PACK ARTHROSCOPY WL (CUSTOM PROCEDURE TRAY) ×2 IMPLANT
PADDING CAST COTTON 6X4 STRL (CAST SUPPLIES) ×2 IMPLANT
PADDING WEBRIL 6 STERILE (GAUZE/BANDAGES/DRESSINGS) ×1 IMPLANT
SET ARTHROSCOPY TUBING (MISCELLANEOUS) ×2
SET ARTHROSCOPY TUBING LN (MISCELLANEOUS) ×1 IMPLANT
SUT ETHILON 4 0 PS 2 18 (SUTURE) ×2 IMPLANT
WRAP KNEE MAXI GEL POST OP (GAUZE/BANDAGES/DRESSINGS) ×2 IMPLANT

## 2011-03-29 NOTE — Anesthesia Preprocedure Evaluation (Signed)
Anesthesia Evaluation  Patient identified by MRN, date of birth, ID band Patient awake    Reviewed: Allergy & Precautions, H&P , NPO status , Patient's Chart, lab work & pertinent test results  Airway Mallampati: II TM Distance: >3 FB Neck ROM: full    Dental No notable dental hx. (+) Teeth Intact and Dental Advisory Given   Pulmonary asthma ,  Mild asthma clear to auscultation  Pulmonary exam normal       Cardiovascular Exercise Tolerance: Good neg cardio ROS - dysrhythmias regular Normal    Neuro/Psych Anxiety Negative Neurological ROS  Negative Psych ROS   GI/Hepatic negative GI ROS, Neg liver ROS,   Endo/Other  Negative Endocrine ROS  Renal/GU negative Renal ROS  Genitourinary negative   Musculoskeletal   Abdominal   Peds  Hematology negative hematology ROS (+)   Anesthesia Other Findings   Reproductive/Obstetrics negative OB ROS                           Anesthesia Physical Anesthesia Plan  ASA: II  Anesthesia Plan: General   Post-op Pain Management:    Induction: Intravenous  Airway Management Planned: LMA  Additional Equipment:   Intra-op Plan:   Post-operative Plan:   Informed Consent: I have reviewed the patients History and Physical, chart, labs and discussed the procedure including the risks, benefits and alternatives for the proposed anesthesia with the patient or authorized representative who has indicated his/her understanding and acceptance.   Dental Advisory Given  Plan Discussed with: CRNA and Surgeon  Anesthesia Plan Comments:         Anesthesia Quick Evaluation

## 2011-03-29 NOTE — Brief Op Note (Signed)
03/29/2011  1:20 PM  PATIENT:  [name]  46 y.o. male  PRE-OPERATIVE DIAGNOSIS:  Meniscal Tear on the Left  POST-OPERATIVE DIAGNOSIS:  Meniscal Tear on the Left  PROCEDURE:  Procedure(s): ARTHROSCOPY KNEE  SURGEON:  Surgeon(s): Javier Docker, MD  PHYSICIAN ASSISTANT:   ASSISTANTS: none   ANESTHESIA:   general  EBL:     BLOOD ADMINISTERED:none  DRAINS: none   LOCAL MEDICATIONS USED:  MARCAINE 20CC  SPECIMEN:  No Specimen  DISPOSITION OF SPECIMEN:  N/A  COUNTS:  YES  TOURNIQUET:  * No tourniquets in log *  DICTATION: .Other Dictation: Dictation Number [id number]  PLAN OF CARE: Discharge to home after PACU  PATIENT DISPOSITION:  PACU - hemodynamically stable.   Delay start of Pharmacological VTE agent (>24hrs) due to surgical blood loss or risk of bleeding:  {YES/NO/NOT APPLICABLE:20182

## 2011-03-29 NOTE — Transfer of Care (Signed)
Immediate Anesthesia Transfer of Care Note  Patient: [name]  Procedure(s) Performed:  ARTHROSCOPY KNEE - Left Knee Arthroscopy with Debridement  Patient Location: PACU  Anesthesia Type: General  Level of Consciousness: awake and alert   Airway & Oxygen Therapy: Patient Spontanous Breathing and Patient connected to face mask oxygen  Post-op Assessment: Report given to PACU RN and Post -op Vital signs reviewed and stable  Post vital signs: Reviewed and stable Filed Vitals:   03/29/11 1019  BP: 130/85  Pulse: 79  Temp: 36.7 C  Resp: 18    Complications: No apparent anesthesia complications
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
## 2011-03-29 NOTE — Anesthesia Postprocedure Evaluation (Signed)
  Anesthesia Post-op Note  Patient: [name]  Procedure(s) Performed:  ARTHROSCOPY KNEE - Left Knee Arthroscopy with Debridement  Patient Location: PACU  Anesthesia Type: General  Level of Consciousness: awake and alert   Airway and Oxygen Therapy: Patient Spontanous Breathing  Post-op Pain: mild  Post-op Assessment: Post-op Vital signs reviewed, Patient's Cardiovascular Status Stable, Respiratory Function Stable, Patent Airway and No signs of Nausea or vomiting  Post-op Vital Signs: stable  Complications: No apparent anesthesia complications

## 2011-03-30 NOTE — Telephone Encounter (Signed)
No, this is still addressed to VS for him to review.

## 2011-03-30 NOTE — Telephone Encounter (Signed)
Mindy do you know if this has been taken care of yet? Pls advise.
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
## 2011-04-02 ENCOUNTER — Encounter (HOSPITAL_COMMUNITY): Payer: Self-pay | Admitting: Specialist

## 2011-04-10 NOTE — Telephone Encounter (Signed)
Pt is aware and would like for Dr. Craige Cotta to write the letter explaining his pulmonary issues. Pt says to call him once the letter is ready and he will pick this up.
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
## 2011-04-10 NOTE — Telephone Encounter (Signed)
lmomtcb x1 for pt 

## 2011-04-19 ENCOUNTER — Encounter: Payer: Self-pay | Admitting: Pulmonary Disease

## 2011-04-19 NOTE — Telephone Encounter (Signed)
Dr. Craige Cotta please advise if letter has been completed yet so we can notify the pt. Thanks. Carron Curie, CMA

## 2011-04-19 NOTE — Telephone Encounter (Signed)
Patient is aware letter is ready for pick up and will come by later today to get this.

## 2011-04-19 NOTE — Telephone Encounter (Signed)
I have completed letter and place in my look at area on side A.

## 2011-05-29 ENCOUNTER — Other Ambulatory Visit: Payer: Self-pay | Admitting: Otolaryngology

## 2011-05-31 ENCOUNTER — Ambulatory Visit (INDEPENDENT_AMBULATORY_CARE_PROVIDER_SITE_OTHER): Payer: Worker's Compensation | Admitting: Pulmonary Disease

## 2011-05-31 ENCOUNTER — Encounter: Payer: Self-pay | Admitting: Pulmonary Disease

## 2011-05-31 VITALS — BP 108/60 | HR 86 | Temp 97.7°F | Ht 69.0 in | Wt 285.6 lb

## 2011-05-31 DIAGNOSIS — R0609 Other forms of dyspnea: Secondary | ICD-10-CM

## 2011-05-31 DIAGNOSIS — R059 Cough, unspecified: Secondary | ICD-10-CM | POA: Insufficient documentation

## 2011-05-31 DIAGNOSIS — J45909 Unspecified asthma, uncomplicated: Secondary | ICD-10-CM

## 2011-05-31 DIAGNOSIS — R058 Other specified cough: Secondary | ICD-10-CM | POA: Insufficient documentation

## 2011-05-31 DIAGNOSIS — R05 Cough: Secondary | ICD-10-CM | POA: Insufficient documentation

## 2011-05-31 DIAGNOSIS — R0683 Snoring: Secondary | ICD-10-CM

## 2011-05-31 MED ORDER — MOMETASONE FURO-FORMOTEROL FUM 200-5 MCG/ACT IN AERO: 2.0000 | INHALATION_SPRAY | Freq: Two times a day (BID) | RESPIRATORY_TRACT | Status: DC

## 2011-05-31 MED ORDER — FLUTICASONE PROPIONATE 50 MCG/ACT NA SUSP: 2.0000 | Freq: Every day | NASAL | Status: DC

## 2011-05-31 MED ORDER — OMEPRAZOLE 20 MG PO CPDR: 40.0000 mg | DELAYED_RELEASE_CAPSULE | Freq: Every day | ORAL | Status: DC

## 2011-05-31 NOTE — Assessment & Plan Note (Signed)
Will increase dose of dulera to 200/5 two puffs bid, and continue prn xopenex.

## 2011-05-31 NOTE — Patient Instructions (Signed)
Dulera 200/5 two puffs twice per day Xopenex two puffs as needed for cough, wheeze, or chest congestion Flonase two sprays in each nostril daily Prilosec (omeprazole) 40 mg one pill daily 30 minutes before first meal of the day Follow up in 3 weeks

## 2011-05-31 NOTE — Assessment & Plan Note (Addendum)
Multifactorial related to post-nasal drip, asthma, and reflux.  His asthma therapy is being adjusted.  He is to use flonase on a regular basis.  He is to use OTC prilosec for GERD.
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
## 2011-06-04 LAB — PULMONARY FUNCTION TEST

## 2011-06-06 NOTE — Assessment & Plan Note (Signed)
He has symptoms suggestive of sleep apnea.  He would like to see how he does with change to his inhaler regimen, and then reassess whether he needs evaluation for sleep apnea. 

## 2011-06-14 ENCOUNTER — Encounter (HOSPITAL_COMMUNITY): Payer: Self-pay | Admitting: Pharmacy Technician

## 2011-06-15 ENCOUNTER — Encounter (HOSPITAL_COMMUNITY)
Admission: RE | Admit: 2011-06-15 | Discharge: 2011-06-15 | Disposition: A | Discharge status: Home / Self Care | Payer: Worker's Compensation | Source: Ambulatory Visit | Attending: Specialist | Admitting: Specialist

## 2011-06-15 ENCOUNTER — Ambulatory Visit (HOSPITAL_COMMUNITY)
Admission: RE | Admit: 2011-06-15 | Discharge: 2011-06-15 | Disposition: A | Discharge status: Home / Self Care | Payer: Worker's Compensation | Source: Ambulatory Visit | Attending: Otolaryngology | Admitting: Otolaryngology

## 2011-06-15 ENCOUNTER — Encounter (HOSPITAL_COMMUNITY): Payer: Self-pay

## 2011-06-15 ENCOUNTER — Ambulatory Visit (HOSPITAL_COMMUNITY): Admission: RE | Admit: 2011-06-15 | Payer: Self-pay | Source: Ambulatory Visit

## 2011-06-15 DIAGNOSIS — M129 Arthropathy, unspecified: Secondary | ICD-10-CM | POA: Insufficient documentation

## 2011-06-15 DIAGNOSIS — M545 Low back pain, unspecified: Secondary | ICD-10-CM | POA: Insufficient documentation

## 2011-06-15 DIAGNOSIS — Z01812 Encounter for preprocedural laboratory examination: Secondary | ICD-10-CM | POA: Insufficient documentation

## 2011-06-15 DIAGNOSIS — Z01818 Encounter for other preprocedural examination: Secondary | ICD-10-CM | POA: Insufficient documentation

## 2011-06-15 LAB — CBC
HCT: 45.6 % (ref 39.0–52.0)
Hemoglobin: 15.8 g/dL (ref 13.0–17.0)
MCH: 29.3 pg (ref 26.0–34.0)
MCHC: 34.6 g/dL (ref 30.0–36.0)
MCV: 84.4 fL (ref 78.0–100.0)
Platelets: 182 10*3/uL (ref 150–400)
RBC: 5.4 MIL/uL (ref 4.22–5.81)
RDW: 13.3 % (ref 11.5–15.5)
WBC: 5 10*3/uL (ref 4.0–10.5)

## 2011-06-15 LAB — SURGICAL PCR SCREEN
MRSA, PCR: NEGATIVE
Staphylococcus aureus: POSITIVE — AB

## 2011-06-15 NOTE — Progress Notes (Signed)
06/15/11 1502  OBSTRUCTIVE SLEEP APNEA  Have you ever been diagnosed with sleep apnea through a sleep study? No  Do you snore loudly (loud enough to be heard through closed doors)?  0  Do you often feel tired, fatigued, or sleepy during the daytime? 1  Has anyone observed you stop breathing during your sleep? 0  Do you have, or are you being treated for high blood pressure? 0  BMI more than 35 kg/m2? 1  Age over 44 years old? 0  Neck circumference greater than 40 cm/18 inches? 1  Gender: 1  Obstructive Sleep Apnea Score 4   Score 4 or greater  Updated health history

## 2011-06-15 NOTE — Patient Instructions (Signed)
[address]  06/15/2011   Your procedure is scheduled on:  Thursday 06/28/2011 at 1000am  Report to [facility] at 0800 AM.  Call this number if you have problems the morning of surgery: [phone]   Remember:   Do not eat food:After Midnight.  May have clear liquids:until Midnight   Take these medicines the morning of surgery with A SIP OF WATER: use Percocet if needed,Dulera inhaler, Xopenex inhaler and Flonase nasal spray if needed   Do not wear jewelry  Do not wear lotions, powders, or perfumes  Do not shave 48 hours prior to surgery(women only-shaving legs).  Do not bring valuables to the hospital.  Contacts, dentures or bridgework may not be worn into surgery.  Leave suitcase in the car. After surgery it may be brought to your room.  For patients admitted to the hospital, checkout time is 11:00 AM the day of discharge.      Special Instructions: CHG Shower Use Special Wash: 1/2 bottle night before surgery and 1/2 bottle morning of surgery.   Please read over the following fact sheets that you were given: MRSA Information, sleep apnea sheet, incentive spirometry sheet

## 2011-06-26 ENCOUNTER — Telehealth: Payer: Self-pay | Admitting: Pulmonary Disease

## 2011-06-26 ENCOUNTER — Encounter: Payer: Self-pay | Admitting: Pulmonary Disease

## 2011-06-26 ENCOUNTER — Ambulatory Visit (INDEPENDENT_AMBULATORY_CARE_PROVIDER_SITE_OTHER): Payer: Worker's Compensation | Admitting: Pulmonary Disease

## 2011-06-26 DIAGNOSIS — R0989 Other specified symptoms and signs involving the circulatory and respiratory systems: Secondary | ICD-10-CM

## 2011-06-26 DIAGNOSIS — R0683 Snoring: Secondary | ICD-10-CM

## 2011-06-26 DIAGNOSIS — J45909 Unspecified asthma, uncomplicated: Secondary | ICD-10-CM

## 2011-06-26 DIAGNOSIS — R0609 Other forms of dyspnea: Secondary | ICD-10-CM

## 2011-06-26 MED ORDER — MONTELUKAST SODIUM 10 MG PO TABS: 10.0000 mg | ORAL_TABLET | Freq: Every day | ORAL | Status: DC

## 2011-06-26 NOTE — Patient Instructions (Signed)
Dulera two puffs twice per day Montelukast (singulair) 10 mg one pill nightly before bedtime Follow up in 2 months

## 2011-06-26 NOTE — Assessment & Plan Note (Addendum)
He has improvement in symptoms with increase in dulera.  However, he still has symptoms.  Will add singulair to his regimen.  He is to continue as needed xopenex.  If he continues to have dyspnea, then may need to consider doing cardiopulmonary stress testing.

## 2011-06-26 NOTE — Telephone Encounter (Signed)
Is it okay for pt to have handicap parking placard? Please advise Dr. Craige Cotta, thanks
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
## 2011-06-27 NOTE — Telephone Encounter (Signed)
LMOMTCB x 1 

## 2011-06-27 NOTE — Telephone Encounter (Signed)
Pt advised. Arlin Sass, CMA  

## 2011-06-27 NOTE — Telephone Encounter (Signed)
Spouse called back re: same. Hazel Sams

## 2011-06-27 NOTE — Telephone Encounter (Signed)
Please inform patient that he would need to have severe decrease in breathing test numbers (FEV1) to qualify for handicap parking permit related to his pulmonary disease.  He does not meet this criteria based on most recent PFT.

## 2011-06-28 ENCOUNTER — Encounter (HOSPITAL_COMMUNITY): Admission: RE | Payer: Self-pay | Source: Ambulatory Visit

## 2011-06-28 ENCOUNTER — Ambulatory Visit (HOSPITAL_COMMUNITY): Admission: RE | Admit: 2011-06-28 | Payer: Worker's Compensation | Source: Ambulatory Visit | Admitting: Specialist

## 2011-06-28 SURGERY — LUMBAR LAMINECTOMY/DECOMPRESSION MICRODISCECTOMY
Anesthesia: General | Laterality: Bilateral

## 2011-07-04 ENCOUNTER — Encounter: Payer: Self-pay | Admitting: Pulmonary Disease

## 2011-07-04 NOTE — Assessment & Plan Note (Signed)
He has symptoms suggestive of sleep apnea.  He would like to see how he does with change to his inhaler regimen, and then reassess whether he needs evaluation for sleep apnea. 

## 2011-07-05 ENCOUNTER — Telehealth: Payer: Self-pay | Admitting: Pulmonary Disease

## 2011-07-05 NOTE — Telephone Encounter (Signed)
This should be processed through [employer]  Per [employer]. They have approved this medication and the pharmacist apologized for sending the request to our office. Nothing further is needed.

## 2011-07-20 ENCOUNTER — Telehealth (INDEPENDENT_AMBULATORY_CARE_PROVIDER_SITE_OTHER): Payer: Self-pay

## 2011-07-20 NOTE — Telephone Encounter (Signed)
Pt called c/o exquisite tenderness in the left testicle.  He is s/p inguinal hernia repair by Dr. Freida Busman.  Tenderness began about three weeks ago.  It comes and goes, and is aggravated by movement and bending.  Pt was advised to apply warm moist compress and/or ice - whichever was most effective, and to take ibuprofen every 6 hours.  If pain worsens call our office.

## 2011-07-26 ENCOUNTER — Ambulatory Visit (INDEPENDENT_AMBULATORY_CARE_PROVIDER_SITE_OTHER): Payer: 59 | Admitting: General Surgery

## 2011-07-26 ENCOUNTER — Encounter (INDEPENDENT_AMBULATORY_CARE_PROVIDER_SITE_OTHER): Payer: Self-pay | Admitting: General Surgery

## 2011-07-26 VITALS — BP 136/88 | HR 88 | Temp 97.8°F | Resp 16 | Ht 69.0 in | Wt 272.0 lb

## 2011-07-26 DIAGNOSIS — R1032 Left lower quadrant pain: Secondary | ICD-10-CM | POA: Insufficient documentation

## 2011-07-26 NOTE — Patient Instructions (Signed)
Will plan for CT of pelvis to look for recurrent hernia. If negative then it may be a disc problem causing pain
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
## 2011-08-01 ENCOUNTER — Ambulatory Visit
Admission: RE | Admit: 2011-08-01 | Discharge: 2011-08-01 | Disposition: A | Discharge status: Home / Self Care | Payer: 59 | Source: Ambulatory Visit | Attending: General Surgery | Admitting: General Surgery

## 2011-08-01 DIAGNOSIS — R1032 Left lower quadrant pain: Secondary | ICD-10-CM

## 2011-08-01 MED ORDER — IOHEXOL 300 MG/ML  SOLN
125.0000 mL | Freq: Once | INTRAMUSCULAR | Status: AC | PRN
  Administered 2011-08-01: 125 mL via INTRAVENOUS

## 2011-08-02 ENCOUNTER — Telehealth (INDEPENDENT_AMBULATORY_CARE_PROVIDER_SITE_OTHER): Payer: Self-pay | Admitting: General Surgery

## 2011-08-02 NOTE — Telephone Encounter (Signed)
Left message for pt to call me back regarding his CT results.  Pt called me back and I informed him that his CT did not show a hernia or any abnormalities.  Pt wanted a copy of his CT so I put a copy in an envelope at the front and told him he could pick it up whenever it is convenient for him.  I also explained that there was nothing else we could do for him here so he should follow up with his PCP about the continued groin pain.

## 2011-08-03 ENCOUNTER — Encounter (INDEPENDENT_AMBULATORY_CARE_PROVIDER_SITE_OTHER): Payer: Self-pay | Admitting: General Surgery

## 2011-08-03 ENCOUNTER — Ambulatory Visit (INDEPENDENT_AMBULATORY_CARE_PROVIDER_SITE_OTHER): Payer: 59 | Admitting: General Surgery

## 2011-08-03 VITALS — BP 118/82 | HR 104 | Temp 97.6°F | Resp 14 | Ht 69.0 in | Wt 272.4 lb

## 2011-08-03 DIAGNOSIS — R1032 Left lower quadrant pain: Secondary | ICD-10-CM

## 2011-08-03 NOTE — Patient Instructions (Signed)
Follow up with Dr. Stern

## 2011-08-07 ENCOUNTER — Encounter (INDEPENDENT_AMBULATORY_CARE_PROVIDER_SITE_OTHER): Payer: Self-pay | Admitting: General Surgery
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
## 2011-08-13 ENCOUNTER — Telehealth (INDEPENDENT_AMBULATORY_CARE_PROVIDER_SITE_OTHER): Payer: Self-pay

## 2011-08-13 NOTE — Telephone Encounter (Signed)
The patient called and said he was just seen a couple weeks ago.  Dr Carolynne Edouard told him if his testicles become tender and his scrotum draws up tight he should call.  He is very tender in his groin.  He has to walk bent over.  He is urinating ok.  He would like to be seen.  Please call if he can be worked in.

## 2011-08-13 NOTE — Telephone Encounter (Signed)
CAlled patient and advised him to go to ER

## 2011-08-24 ENCOUNTER — Ambulatory Visit (INDEPENDENT_AMBULATORY_CARE_PROVIDER_SITE_OTHER): Payer: Worker's Compensation | Admitting: Internal Medicine

## 2011-08-24 ENCOUNTER — Encounter: Payer: Self-pay | Admitting: Internal Medicine

## 2011-08-24 ENCOUNTER — Telehealth: Payer: Self-pay | Admitting: Pulmonary Disease

## 2011-08-24 VITALS — BP 128/88 | HR 92 | Temp 97.0°F | Ht 69.0 in | Wt 271.4 lb

## 2011-08-24 DIAGNOSIS — J45909 Unspecified asthma, uncomplicated: Secondary | ICD-10-CM

## 2011-08-24 MED ORDER — ALPRAZOLAM 0.5 MG PO TABS: ORAL_TABLET | ORAL | Status: DC

## 2011-08-24 NOTE — Telephone Encounter (Signed)
Pt coming in to see MW at 2:30
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
## 2011-08-24 NOTE — Patient Instructions (Addendum)
Dulera 200  Take 2 puffs first thing in am and then another 2 puffs about 12 hours later perfectly regularly  Work on inhaler technique:  relax and gently blow all the way out then take a nice smooth deep breath back in, triggering the inhaler at same time you start breathing in.  Hold for up to 5 seconds if you can.  Rinse and gargle with water when done   If your mouth or throat starts to bother you,   I suggest you time the inhaler to your dental care and after using the inhaler(s) brush teeth and tongue with a baking soda containing toothpaste and when you rinse this out, gargle with it first to see if this helps your mouth and throat.     If you can't catch your breath between dulera ok to use xopenex hfa 2 puffs every 4 hours or try the xanax 0.5 mg   Keep the appt to see Dr Craige Cotta

## 2011-08-24 NOTE — Telephone Encounter (Signed)
lmomtcb x1 for pt--offer apt with MW this afternoon

## 2011-08-25 NOTE — Assessment & Plan Note (Addendum)
PFT 01/03/11>>FEV1 3.79(77%), FEV1% 87, TLC 5.69(85%), DLCO 78%, positive BD response.(good FV loop but not typical of airflow obstruction) PFT ([facility]) 06/04/11>>FEV1 2.86 (84%), FEV1% 86, TLC 4.50 (67%), DLCO 58%, +BD response (NB very atypical FV loop not c/w airflow obstruction) CXR [facility]) 06/04/11>>no focal ASD, no PTX or effusion, normal cardiac silhouette  On my review the main abnormality on pft's is a very low and disproportionate erv typical of the effects of obesity. This pushes his resting fv loop close to his max FV.  However, he may very well have mild asthma as well  but Symptoms are markedly disproportionate to objective findings and not clear this is all a  lung problem (note especially the absence of symptoms while sleeping or early in the am p he takes percocet)  but pt does appear to have difficult airway management issues.  DDX of  difficult airways managment all start with A and  include Adherence, Ace Inhibitors, Acid Reflux, Active Sinus Disease, Alpha 1 Antitripsin deficiency, Anxiety masquerading as Airways dz,  ABPA,  allergy(esp in young), Aspiration (esp in elderly), Adverse effects of DPI,  Active smokers, plus two Bs  = Bronchiectasis and Beta blocker use..and one C= CHF   Adherence is always the initial "prime suspect" and is a multilayered concern that requires a "trust but verify" approach in every patient - starting with knowing how to use medications, especially inhalers, correctly, keeping up with refills and understanding the fundamental difference between maintenance and prns vs those medications only taken for a very short course and then stopped and not refilled. The proper method of use, as well as anticipated side effects, of this metered-dose inhaler are discussed and demonstrated to the patient. He improved from 25% to maybe 50% at most and this may explain in part why he uses so much saba > consider trial of performist/bud per neb (rather than dpi which likely will  just make his cough worse)  Anxiety > usually a dx of exclusion but very likely part of his problem > try xanax 0.5 mg bid prn  Allergy > no response at all so far to singulair but defer to Dr Craige Cotta whether to continue it.  Acid reflux > obesity places him at risk.

## 2011-09-18 ENCOUNTER — Other Ambulatory Visit: Payer: Self-pay | Admitting: Neurosurgery

## 2011-09-23 ENCOUNTER — Other Ambulatory Visit: Payer: Self-pay | Admitting: Pulmonary Disease

## 2011-09-24 ENCOUNTER — Encounter: Payer: Self-pay | Admitting: Pulmonary Disease

## 2011-09-24 ENCOUNTER — Ambulatory Visit (INDEPENDENT_AMBULATORY_CARE_PROVIDER_SITE_OTHER): Payer: Worker's Compensation | Admitting: Pulmonary Disease

## 2011-09-24 VITALS — BP 102/78 | HR 97 | Temp 98.2°F | Ht 69.0 in | Wt 271.2 lb

## 2011-09-24 DIAGNOSIS — R059 Cough, unspecified: Secondary | ICD-10-CM

## 2011-09-24 DIAGNOSIS — R0989 Other specified symptoms and signs involving the circulatory and respiratory systems: Secondary | ICD-10-CM

## 2011-09-24 DIAGNOSIS — R05 Cough: Secondary | ICD-10-CM

## 2011-09-24 DIAGNOSIS — R0600 Dyspnea, unspecified: Secondary | ICD-10-CM | POA: Insufficient documentation

## 2011-09-24 DIAGNOSIS — J310 Chronic rhinitis: Secondary | ICD-10-CM

## 2011-09-24 DIAGNOSIS — J329 Chronic sinusitis, unspecified: Secondary | ICD-10-CM | POA: Insufficient documentation

## 2011-09-24 DIAGNOSIS — J45909 Unspecified asthma, uncomplicated: Secondary | ICD-10-CM

## 2011-09-24 DIAGNOSIS — F4310 Post-traumatic stress disorder, unspecified: Secondary | ICD-10-CM

## 2011-09-24 DIAGNOSIS — R0609 Other forms of dyspnea: Secondary | ICD-10-CM

## 2011-09-24 DIAGNOSIS — R0683 Snoring: Secondary | ICD-10-CM

## 2011-09-24 MED ORDER — AEROCHAMBER MV MISC: Status: DC

## 2011-09-24 NOTE — Assessment & Plan Note (Signed)
I am not sure all of his current symptoms are related to asthma, although I think he does have asthma from work related exposures.  He is to continue his current regimen.  Will have him use spacer device with his inhalers.  Further adjustments will be determined after review of his CT sinus and chest.

## 2011-09-24 NOTE — Assessment & Plan Note (Addendum)
He has symptoms suggestive of sleep apnea.  He would like to see how he does with change to his inhaler regimen, and then reassess whether he needs evaluation for sleep apnea. 

## 2011-09-24 NOTE — Assessment & Plan Note (Signed)
He has persistent dyspnea, that has not fully improved in spite of aggressive asthma therapy.  Some of his symptoms are likely related to deconditioning and anxiety.  He also has persistent sinus symptoms.  To further assess will arrange for CT sinus and chest with contrast.  Ideally he should have CPST, but his back pain limits his exercise capacity at present.
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
## 2011-09-24 NOTE — Assessment & Plan Note (Signed)
He is followed by Dr. Evelene Croon with psychiatry.  Certainly, his anxiety could be contributing to his sensation of dyspnea.

## 2011-09-24 NOTE — Assessment & Plan Note (Signed)
He is to continue flonase. 

## 2011-09-24 NOTE — Assessment & Plan Note (Addendum)
Multifactorial related to post-nasal drip, asthma, and reflux.  Continue tussionex prn.

## 2011-09-24 NOTE — Patient Instructions (Signed)
Will schedule CT sinus and chest Follow up in 2 weeks

## 2011-09-27 ENCOUNTER — Other Ambulatory Visit: Payer: Self-pay

## 2011-10-03 ENCOUNTER — Encounter (HOSPITAL_COMMUNITY): Payer: Self-pay | Admitting: Pharmacy Technician

## 2011-10-03 ENCOUNTER — Telehealth: Payer: Self-pay | Admitting: Pulmonary Disease

## 2011-10-03 NOTE — Telephone Encounter (Signed)
CT sinus 09/27/11>>mild changes of chronic sinusitis, marked leftward nasal septal deviation, with eccentric vomer spur, right concha bullosa  CT chest with contrast 09/27/11>>atelectasis, hypodense area in liver.

## 2011-10-05 ENCOUNTER — Encounter (HOSPITAL_COMMUNITY): Payer: Self-pay | Admitting: Pharmacy Technician

## 2011-10-09 ENCOUNTER — Ambulatory Visit (INDEPENDENT_AMBULATORY_CARE_PROVIDER_SITE_OTHER): Payer: Worker's Compensation | Admitting: Pulmonary Disease

## 2011-10-09 ENCOUNTER — Telehealth: Payer: Self-pay | Admitting: Pulmonary Disease

## 2011-10-09 ENCOUNTER — Encounter: Payer: Self-pay | Admitting: Pulmonary Disease

## 2011-10-09 VITALS — BP 110/64 | HR 79 | Temp 97.7°F | Ht 69.0 in | Wt 269.2 lb

## 2011-10-09 DIAGNOSIS — F4310 Post-traumatic stress disorder, unspecified: Secondary | ICD-10-CM

## 2011-10-09 DIAGNOSIS — J45909 Unspecified asthma, uncomplicated: Secondary | ICD-10-CM

## 2011-10-09 DIAGNOSIS — K769 Liver disease, unspecified: Secondary | ICD-10-CM

## 2011-10-09 DIAGNOSIS — Z01811 Encounter for preprocedural respiratory examination: Secondary | ICD-10-CM

## 2011-10-09 DIAGNOSIS — R0683 Snoring: Secondary | ICD-10-CM

## 2011-10-09 DIAGNOSIS — R0609 Other forms of dyspnea: Secondary | ICD-10-CM

## 2011-10-09 DIAGNOSIS — J329 Chronic sinusitis, unspecified: Secondary | ICD-10-CM

## 2011-10-09 DIAGNOSIS — R059 Cough, unspecified: Secondary | ICD-10-CM

## 2011-10-09 DIAGNOSIS — R05 Cough: Secondary | ICD-10-CM

## 2011-10-09 DIAGNOSIS — K7689 Other specified diseases of liver: Secondary | ICD-10-CM

## 2011-10-09 NOTE — Telephone Encounter (Signed)
Will discuss results with pt at [facility] on 10/09/2011.

## 2011-10-09 NOTE — Telephone Encounter (Signed)
Discussed CT chest and sinus results with pt's wife.  Explained reasoning for ENT referral, and u/s abdomen.

## 2011-10-09 NOTE — Assessment & Plan Note (Signed)
He is followed by Dr. Kaur with psychiatry.  Certainly, his anxiety could be contributing to his sensation of dyspnea. 

## 2011-10-09 NOTE — Telephone Encounter (Signed)
Patient calling back asking if calling wife before 5:00 call #[phone].

## 2011-10-09 NOTE — Assessment & Plan Note (Signed)
He is to continue dulera, singulair, and xopenex pending ENT evaluation.

## 2011-10-09 NOTE — Assessment & Plan Note (Signed)
He is scheduled for back surgery August 9.    I think he can proceed with surgery.  He will need to continue with his asthma and sinus regimen during the peri-operative period.  There is also concern for sleep apnea, but this has not been tested yet.  He should have close monitoring of his oxygenation in the post-operative period.  Pulmonary service can be available as needed after surgery.
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
## 2011-10-09 NOTE — Assessment & Plan Note (Signed)
He was noted to have hypodense area on liver.  Will arrange for abdominal ultrasound to further assess.

## 2011-10-09 NOTE — Assessment & Plan Note (Signed)
He has symptoms suggestive of sleep apnea.  He would like to see how he does with change to his inhaler regimen, and then reassess whether he needs evaluation for sleep apnea. 
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
## 2011-10-09 NOTE — Telephone Encounter (Signed)
Patients wife calling after patient was seen today with OV.  Would like to speak to/or get some clarification on what was discussed in OV today with the patient.  Patients spouse would like clarification on if patient is going to need nasal surgery, how large the spot on his liver is and if this spot is of major concern, and anything else Dr.[name] may have interpreted from CT.  Please advise, thank you.

## 2011-10-09 NOTE — Patient Instructions (Signed)
Will arrange for ENT referral Will arrange for ultrasound of abdomen Follow up in 8 weeks

## 2011-10-09 NOTE — Assessment & Plan Note (Signed)
Multifactorial related to post-nasal drip, asthma, and reflux.      

## 2011-10-12 ENCOUNTER — Encounter (HOSPITAL_COMMUNITY)
Admission: RE | Admit: 2011-10-12 | Discharge: 2011-10-12 | Disposition: A | Discharge status: Home / Self Care | Payer: Worker's Compensation | Source: Ambulatory Visit | Attending: Neurosurgery | Admitting: Neurosurgery

## 2011-10-12 ENCOUNTER — Encounter (HOSPITAL_COMMUNITY): Payer: Self-pay

## 2011-10-12 ENCOUNTER — Telehealth: Payer: Self-pay | Admitting: Pulmonary Disease

## 2011-10-12 HISTORY — DX: Other complications of anesthesia, initial encounter: T88.59XA

## 2011-10-12 HISTORY — DX: Sprain of other specified parts of unspecified knee, initial encounter: S83.8X9A

## 2011-10-12 HISTORY — DX: Major depressive disorder, single episode, unspecified: F32.9

## 2011-10-12 HISTORY — DX: Depression, unspecified: F32.A

## 2011-10-12 HISTORY — DX: Unilateral inguinal hernia, without obstruction or gangrene, not specified as recurrent: K40.90

## 2011-10-12 HISTORY — DX: Adverse effect of unspecified anesthetic, initial encounter: T41.45XA

## 2011-10-12 LAB — SURGICAL PCR SCREEN
MRSA, PCR: NEGATIVE
Staphylococcus aureus: NEGATIVE

## 2011-10-12 LAB — CBC
HCT: 44.9 % (ref 39.0–52.0)
Hemoglobin: 15.7 g/dL (ref 13.0–17.0)
MCH: 29.3 pg (ref 26.0–34.0)
MCHC: 35 g/dL (ref 30.0–36.0)
MCV: 83.8 fL (ref 78.0–100.0)
Platelets: 146 10*3/uL — ABNORMAL LOW (ref 150–400)
RBC: 5.36 MIL/uL (ref 4.22–5.81)
RDW: 13.7 % (ref 11.5–15.5)
WBC: 4.7 10*3/uL (ref 4.0–10.5)

## 2011-10-12 NOTE — Telephone Encounter (Signed)
Spoke with Amanda and notified her of pt's dx and ins id number for the aero chamber that they provided for pt. Nothing further needed.  

## 2011-10-12 NOTE — Pre-Procedure Instructions (Signed)
20 OREL COOLER  10/12/2011   Your procedure is scheduled on:  Friday October 19, 2011  Report to [facility] at 8:30 AM.  Call this number if you have problems the morning of surgery: [phone]   Remember:   Do not eat food or drink :After Midnight.      Take these medicines the morning of surgery with A SIP OF WATER: flonase, levalbuterol, robaxin, dulera, zoloft, percocet*   Do not wear jewelry, make-up or nail polish.  Do not wear lotions, powders, or perfumes. You may wear deodorant.  Do not shave 48 hours prior to surgery. Men may shave face and neck.  Do not bring valuables to the hospital.  Contacts, dentures or bridgework may not be worn into surgery.  Leave suitcase in the car. After surgery it may be brought to your room.  For patients admitted to the hospital, checkout time is 11:00 AM the day of discharge.   Patients discharged the day of surgery will not be allowed to drive home.  Name and phone number of your driver: Jayvien Rowlette [phone]  Special Instructions: CHG Shower Use Special Wash: 1/2 bottle night before surgery and 1/2 bottle morning of surgery.   Please read over the following fact sheets that you were given: Pain Booklet, Coughing and Deep Breathing, MRSA Information and Surgical Site Infection Prevention

## 2011-10-12 NOTE — Telephone Encounter (Signed)
ATC Amanda x2 > line busy.  WCB.

## 2011-10-15 ENCOUNTER — Telehealth: Payer: Self-pay | Admitting: Pulmonary Disease

## 2011-10-15 NOTE — Telephone Encounter (Signed)
lmomtcb x1 for pt 
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
## 2011-10-16 ENCOUNTER — Other Ambulatory Visit (HOSPITAL_COMMUNITY): Payer: Self-pay

## 2011-10-16 NOTE — Telephone Encounter (Signed)
[email or web address]  

## 2011-10-17 ENCOUNTER — Encounter: Payer: Self-pay | Admitting: Pulmonary Disease

## 2011-10-17 NOTE — Telephone Encounter (Signed)
lmomtcb x3 for pt. This is # listed is mobile and home #

## 2011-10-18 MED ORDER — DEXTROSE 5 % IV SOLN
3.0000 g | INTRAVENOUS | Status: DC
  Filled 2011-10-18: qty 3000
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
## 2011-10-18 NOTE — Telephone Encounter (Signed)
LMOM TCB x4 - will close message per triage protocol.  Informed pt this message will be closed to await him to call back.

## 2011-10-19 ENCOUNTER — Ambulatory Visit (HOSPITAL_COMMUNITY)
Admission: RE | Admit: 2011-10-19 | Discharge: 2011-10-20 | Disposition: A | Discharge status: Home / Self Care | Payer: Worker's Compensation | Source: Ambulatory Visit | Attending: Neurosurgery | Admitting: Neurosurgery

## 2011-10-19 ENCOUNTER — Ambulatory Visit (HOSPITAL_COMMUNITY): Payer: Worker's Compensation | Admitting: Vascular Surgery

## 2011-10-19 ENCOUNTER — Encounter (HOSPITAL_COMMUNITY): Payer: Self-pay | Admitting: Vascular Surgery

## 2011-10-19 ENCOUNTER — Encounter (HOSPITAL_COMMUNITY): Payer: Self-pay | Admitting: *Deleted

## 2011-10-19 ENCOUNTER — Ambulatory Visit (HOSPITAL_COMMUNITY): Payer: Worker's Compensation

## 2011-10-19 ENCOUNTER — Encounter (HOSPITAL_COMMUNITY)
Admission: RE | Disposition: A | Discharge status: Home / Self Care | Payer: Self-pay | Source: Ambulatory Visit | Attending: Neurosurgery

## 2011-10-19 DIAGNOSIS — Z01812 Encounter for preprocedural laboratory examination: Secondary | ICD-10-CM | POA: Insufficient documentation

## 2011-10-19 DIAGNOSIS — J45909 Unspecified asthma, uncomplicated: Secondary | ICD-10-CM | POA: Insufficient documentation

## 2011-10-19 DIAGNOSIS — M47817 Spondylosis without myelopathy or radiculopathy, lumbosacral region: Secondary | ICD-10-CM | POA: Insufficient documentation

## 2011-10-19 DIAGNOSIS — F341 Dysthymic disorder: Secondary | ICD-10-CM | POA: Insufficient documentation

## 2011-10-19 DIAGNOSIS — M5126 Other intervertebral disc displacement, lumbar region: Secondary | ICD-10-CM | POA: Insufficient documentation

## 2011-10-19 HISTORY — PX: LUMBAR LAMINECTOMY/DECOMPRESSION MICRODISCECTOMY: SHX5026

## 2011-10-19 SURGERY — LUMBAR LAMINECTOMY/DECOMPRESSION MICRODISCECTOMY 2 LEVELS
Anesthesia: General | Site: Back | Laterality: Bilateral | Wound class: Clean

## 2011-10-19 MED ORDER — HYDROMORPHONE HCL PF 1 MG/ML IJ SOLN
0.2500 mg | INTRAMUSCULAR | Status: DC | PRN
  Administered 2011-10-19 (×4): 0.5 mg via INTRAVENOUS

## 2011-10-19 MED ORDER — ACETAMINOPHEN 325 MG PO TABS: 650.0000 mg | ORAL_TABLET | ORAL | Status: DC | PRN

## 2011-10-19 MED ORDER — METHYLPREDNISOLONE ACETATE 80 MG/ML IJ SUSP
INTRAMUSCULAR | Status: DC | PRN
  Administered 2011-10-19: 80 mg

## 2011-10-19 MED ORDER — LACTATED RINGERS IV SOLN
INTRAVENOUS | Status: DC | PRN
  Administered 2011-10-19 (×2): via INTRAVENOUS

## 2011-10-19 MED ORDER — SUFENTANIL CITRATE 50 MCG/ML IV SOLN
INTRAVENOUS | Status: DC | PRN
  Administered 2011-10-19: 20 ug via INTRAVENOUS
  Administered 2011-10-19: 30 ug via INTRAVENOUS

## 2011-10-19 MED ORDER — HEMOSTATIC AGENTS (NO CHARGE) OPTIME
TOPICAL | Status: DC | PRN
  Administered 2011-10-19: 1 via TOPICAL

## 2011-10-19 MED ORDER — SODIUM CHLORIDE 0.9 % IV SOLN
INTRAVENOUS | Status: AC
  Filled 2011-10-19: qty 500

## 2011-10-19 MED ORDER — 0.9 % SODIUM CHLORIDE (POUR BTL) OPTIME
TOPICAL | Status: DC | PRN
  Administered 2011-10-19: 1000 mL

## 2011-10-19 MED ORDER — ACETAMINOPHEN 650 MG RE SUPP: 650.0000 mg | RECTAL | Status: DC | PRN

## 2011-10-19 MED ORDER — OXYCODONE-ACETAMINOPHEN 5-325 MG PO TABS
1.0000 | ORAL_TABLET | ORAL | Status: DC | PRN
  Administered 2011-10-19 – 2011-10-20 (×2): 2 via ORAL
  Administered 2011-10-20: 1 via ORAL
  Administered 2011-10-20: 2 via ORAL
  Filled 2011-10-19 (×4): qty 2

## 2011-10-19 MED ORDER — FENTANYL CITRATE 0.05 MG/ML IJ SOLN
INTRAMUSCULAR | Status: AC
  Filled 2011-10-19: qty 2

## 2011-10-19 MED ORDER — CEFAZOLIN SODIUM 1-5 GM-% IV SOLN
INTRAVENOUS | Status: DC | PRN
  Administered 2011-10-19: 3 g via INTRAVENOUS

## 2011-10-19 MED ORDER — MORPHINE SULFATE 2 MG/ML IJ SOLN
1.0000 mg | INTRAMUSCULAR | Status: DC | PRN
  Administered 2011-10-19: 2 mg via INTRAVENOUS
  Filled 2011-10-19: qty 1

## 2011-10-19 MED ORDER — IBUPROFEN 800 MG PO TABS
800.0000 mg | ORAL_TABLET | Freq: Two times a day (BID) | ORAL | Status: DC | PRN
  Administered 2011-10-19: 800 mg via ORAL
  Filled 2011-10-19: qty 1

## 2011-10-19 MED ORDER — SODIUM CHLORIDE 0.9 % IJ SOLN: 3.0000 mL | INTRAMUSCULAR | Status: DC | PRN

## 2011-10-19 MED ORDER — DOCUSATE SODIUM 100 MG PO CAPS
100.0000 mg | ORAL_CAPSULE | Freq: Two times a day (BID) | ORAL | Status: DC
  Administered 2011-10-19: 100 mg via ORAL
  Filled 2011-10-19: qty 1

## 2011-10-19 MED ORDER — MIDAZOLAM HCL 5 MG/5ML IJ SOLN
INTRAMUSCULAR | Status: DC | PRN
  Administered 2011-10-19: 2 mg via INTRAVENOUS

## 2011-10-19 MED ORDER — ONDANSETRON HCL 4 MG/2ML IJ SOLN
INTRAMUSCULAR | Status: DC | PRN
  Administered 2011-10-19: 4 mg via INTRAVENOUS

## 2011-10-19 MED ORDER — MONTELUKAST SODIUM 10 MG PO TABS
10.0000 mg | ORAL_TABLET | Freq: Every day | ORAL | Status: DC
Start: 2011-10-19 — End: 2011-10-20
  Filled 2011-10-19 (×2): qty 1

## 2011-10-19 MED ORDER — FENTANYL CITRATE 0.05 MG/ML IJ SOLN
INTRAMUSCULAR | Status: DC | PRN
  Administered 2011-10-19: 100 ug via INTRAVENOUS

## 2011-10-19 MED ORDER — GLYCOPYRROLATE 0.2 MG/ML IJ SOLN
INTRAMUSCULAR | Status: DC | PRN
  Administered 2011-10-19: .6 mg via INTRAVENOUS

## 2011-10-19 MED ORDER — HYDROCOD POLST-CHLORPHEN POLST 10-8 MG/5ML PO LQCR: 5.0000 mL | Freq: Every evening | ORAL | Status: DC | PRN

## 2011-10-19 MED ORDER — PHENYLEPHRINE HCL 10 MG/ML IJ SOLN
INTRAMUSCULAR | Status: DC | PRN
  Administered 2011-10-19 (×2): 40 ug via INTRAVENOUS

## 2011-10-19 MED ORDER — HYDROMORPHONE HCL PF 1 MG/ML IJ SOLN
INTRAMUSCULAR | Status: AC
  Administered 2011-10-19: 0.5 mg via INTRAVENOUS
  Filled 2011-10-19: qty 1

## 2011-10-19 MED ORDER — MENTHOL 3 MG MT LOZG
1.0000 | LOZENGE | OROMUCOSAL | Status: DC | PRN
  Filled 2011-10-19: qty 9

## 2011-10-19 MED ORDER — LIDOCAINE-EPINEPHRINE 1 %-1:100000 IJ SOLN
INTRAMUSCULAR | Status: DC | PRN
  Administered 2011-10-19: 5 mL

## 2011-10-19 MED ORDER — ZOLPIDEM TARTRATE 5 MG PO TABS: 10.0000 mg | ORAL_TABLET | Freq: Every evening | ORAL | Status: DC | PRN

## 2011-10-19 MED ORDER — LIDOCAINE HCL (CARDIAC) 20 MG/ML IV SOLN
INTRAVENOUS | Status: DC | PRN
  Administered 2011-10-19: 80 mg via INTRAVENOUS

## 2011-10-19 MED ORDER — HYDROCODONE-ACETAMINOPHEN 5-325 MG PO TABS: 1.0000 | ORAL_TABLET | ORAL | Status: DC | PRN

## 2011-10-19 MED ORDER — METHOCARBAMOL 500 MG PO TABS: 500.0000 mg | ORAL_TABLET | Freq: Three times a day (TID) | ORAL | Status: DC | PRN

## 2011-10-19 MED ORDER — ALUM & MAG HYDROXIDE-SIMETH 200-200-20 MG/5ML PO SUSP: 30.0000 mL | Freq: Four times a day (QID) | ORAL | Status: DC | PRN

## 2011-10-19 MED ORDER — ONDANSETRON HCL 4 MG/2ML IJ SOLN
4.0000 mg | INTRAMUSCULAR | Status: DC | PRN
  Administered 2011-10-19: 4 mg via INTRAVENOUS
  Filled 2011-10-19: qty 2

## 2011-10-19 MED ORDER — LEVALBUTEROL TARTRATE 45 MCG/ACT IN AERO: 2.0000 | INHALATION_SPRAY | Freq: Four times a day (QID) | RESPIRATORY_TRACT | Status: DC | PRN

## 2011-10-19 MED ORDER — THROMBIN 5000 UNITS EX KIT
PACK | CUTANEOUS | Status: DC | PRN
  Administered 2011-10-19 (×2): 5000 [IU] via TOPICAL

## 2011-10-19 MED ORDER — EPHEDRINE SULFATE 50 MG/ML IJ SOLN
INTRAMUSCULAR | Status: DC | PRN
  Administered 2011-10-19 (×4): 5 mg via INTRAVENOUS

## 2011-10-19 MED ORDER — OXYCODONE-ACETAMINOPHEN 7.5-325 MG PO TABS: 1.0000 | ORAL_TABLET | ORAL | Status: DC | PRN

## 2011-10-19 MED ORDER — CEFAZOLIN SODIUM 1-5 GM-% IV SOLN
1.0000 g | Freq: Three times a day (TID) | INTRAVENOUS | Status: AC
  Administered 2011-10-19 – 2011-10-20 (×2): 1 g via INTRAVENOUS
  Filled 2011-10-19 (×2): qty 50

## 2011-10-19 MED ORDER — BACITRACIN 50000 UNITS IM SOLR
INTRAMUSCULAR | Status: AC
  Filled 2011-10-19: qty 1

## 2011-10-19 MED ORDER — MOMETASONE FURO-FORMOTEROL FUM 200-5 MCG/ACT IN AERO: 2.0000 | INHALATION_SPRAY | Freq: Two times a day (BID) | RESPIRATORY_TRACT | Status: DC

## 2011-10-19 MED ORDER — SODIUM CHLORIDE 0.9 % IV SOLN: 250.0000 mL | INTRAVENOUS | Status: DC

## 2011-10-19 MED ORDER — METAXALONE 800 MG PO TABS
800.0000 mg | ORAL_TABLET | Freq: Three times a day (TID) | ORAL | Status: DC | PRN
  Administered 2011-10-19: 800 mg via ORAL
  Filled 2011-10-19: qty 1

## 2011-10-19 MED ORDER — NEOSTIGMINE METHYLSULFATE 1 MG/ML IJ SOLN
INTRAMUSCULAR | Status: DC | PRN
  Administered 2011-10-19: 4 mg via INTRAVENOUS

## 2011-10-19 MED ORDER — BUPIVACAINE HCL (PF) 0.5 % IJ SOLN
INTRAMUSCULAR | Status: DC | PRN
  Administered 2011-10-19: 5 mL

## 2011-10-19 MED ORDER — PANTOPRAZOLE SODIUM 40 MG IV SOLR
40.0000 mg | Freq: Every day | INTRAVENOUS | Status: DC
  Administered 2011-10-19: 40 mg via INTRAVENOUS
  Filled 2011-10-19 (×2): qty 40

## 2011-10-19 MED ORDER — ROCURONIUM BROMIDE 100 MG/10ML IV SOLN
INTRAVENOUS | Status: DC | PRN
  Administered 2011-10-19: 50 mg via INTRAVENOUS

## 2011-10-19 MED ORDER — LIDOCAINE HCL 4 % MT SOLN
OROMUCOSAL | Status: DC | PRN
  Administered 2011-10-19: 4 mL via TOPICAL

## 2011-10-19 MED ORDER — MIRTAZAPINE 15 MG PO TABS
15.0000 mg | ORAL_TABLET | Freq: Every day | ORAL | Status: DC
  Filled 2011-10-19 (×2): qty 1

## 2011-10-19 MED ORDER — SODIUM CHLORIDE 0.9 % IR SOLN
Status: DC | PRN
  Administered 2011-10-19: 15:00:00

## 2011-10-19 MED ORDER — KCL IN DEXTROSE-NACL 20-5-0.45 MEQ/L-%-% IV SOLN
INTRAVENOUS | Status: DC
  Filled 2011-10-19 (×2): qty 1000

## 2011-10-19 MED ORDER — PROPOFOL 10 MG/ML IV EMUL
INTRAVENOUS | Status: DC | PRN
  Administered 2011-10-19: 200 mg via INTRAVENOUS

## 2011-10-19 MED ORDER — PHENYLEPHRINE HCL 10 MG/ML IJ SOLN
10.0000 mg | INTRAVENOUS | Status: DC | PRN
  Administered 2011-10-19: 10 ug/min via INTRAVENOUS

## 2011-10-19 MED ORDER — PHENOL 1.4 % MT LIQD
1.0000 | OROMUCOSAL | Status: DC | PRN
  Filled 2011-10-19: qty 177

## 2011-10-19 MED ORDER — SODIUM CHLORIDE 0.9 % IJ SOLN: 3.0000 mL | Freq: Two times a day (BID) | INTRAMUSCULAR | Status: DC

## 2011-10-19 MED ORDER — OXYCODONE-ACETAMINOPHEN 5-325 MG PO TABS
ORAL_TABLET | ORAL | Status: AC
  Administered 2011-10-19: 2 via ORAL
  Filled 2011-10-19: qty 2

## 2011-10-19 MED ORDER — SERTRALINE HCL 100 MG PO TABS
100.0000 mg | ORAL_TABLET | Freq: Every day | ORAL | Status: DC
  Filled 2011-10-19 (×2): qty 1

## 2011-10-19 MED ORDER — FLUTICASONE PROPIONATE 50 MCG/ACT NA SUSP
2.0000 | Freq: Every day | NASAL | Status: DC | PRN
  Filled 2011-10-19: qty 16

## 2011-10-19 SURGICAL SUPPLY — 63 items
ADH SKN CLS APL DERMABOND .7 (GAUZE/BANDAGES/DRESSINGS) ×1
APL SKNCLS STERI-STRIP NONHPOA (GAUZE/BANDAGES/DRESSINGS)
BAG DECANTER FOR FLEXI CONT (MISCELLANEOUS) ×2 IMPLANT
BENZOIN TINCTURE PRP APPL 2/3 (GAUZE/BANDAGES/DRESSINGS) IMPLANT
BIT DRILL NEURO 2X3.1 SFT TUCH (MISCELLANEOUS) ×1 IMPLANT
BLADE SURG ROTATE 9660 (MISCELLANEOUS) IMPLANT
BUR ROUND FLUTED 5 RND (BURR) ×2 IMPLANT
CANISTER SUCTION 2500CC (MISCELLANEOUS) ×2 IMPLANT
CLOTH BEACON ORANGE TIMEOUT ST (SAFETY) ×2 IMPLANT
CONT SPEC 4OZ CLIKSEAL STRL BL (MISCELLANEOUS) ×2 IMPLANT
DERMABOND ADVANCED (GAUZE/BANDAGES/DRESSINGS) ×1
DERMABOND ADVANCED .7 DNX12 (GAUZE/BANDAGES/DRESSINGS) ×1 IMPLANT
DRAPE LAPAROTOMY 100X72X124 (DRAPES) ×2 IMPLANT
DRAPE MICROSCOPE LEICA (MISCELLANEOUS) ×2 IMPLANT
DRAPE MICROSCOPE ZEISS OPMI (DRAPES) ×1 IMPLANT
DRAPE POUCH INSTRU U-SHP 10X18 (DRAPES) ×2 IMPLANT
DRAPE SURG 17X23 STRL (DRAPES) ×2 IMPLANT
DRESSING TELFA 8X3 (GAUZE/BANDAGES/DRESSINGS) IMPLANT
DRILL NEURO 2X3.1 SOFT TOUCH (MISCELLANEOUS) ×2
DURAPREP 26ML APPLICATOR (WOUND CARE) ×2 IMPLANT
ELECT BLADE 4.0 EZ CLEAN MEGAD (MISCELLANEOUS) ×2
ELECT REM PT RETURN 9FT ADLT (ELECTROSURGICAL) ×2
ELECTRODE BLDE 4.0 EZ CLN MEGD (MISCELLANEOUS) IMPLANT
ELECTRODE REM PT RTRN 9FT ADLT (ELECTROSURGICAL) ×1 IMPLANT
GAUZE SPONGE 4X4 16PLY XRAY LF (GAUZE/BANDAGES/DRESSINGS) IMPLANT
GLOVE BIO SURGEON STRL SZ8 (GLOVE) ×2 IMPLANT
GLOVE BIOGEL PI IND STRL 7.5 (GLOVE) IMPLANT
GLOVE BIOGEL PI IND STRL 8 (GLOVE) ×1 IMPLANT
GLOVE BIOGEL PI IND STRL 8.5 (GLOVE) ×1 IMPLANT
GLOVE BIOGEL PI INDICATOR 7.5 (GLOVE) ×1
GLOVE BIOGEL PI INDICATOR 8 (GLOVE) ×1
GLOVE BIOGEL PI INDICATOR 8.5 (GLOVE) ×1
GLOVE ECLIPSE 7.5 STRL STRAW (GLOVE) ×3 IMPLANT
GLOVE ECLIPSE 8.0 STRL XLNG CF (GLOVE) ×2 IMPLANT
GLOVE EXAM NITRILE LRG STRL (GLOVE) IMPLANT
GLOVE EXAM NITRILE MD LF STRL (GLOVE) IMPLANT
GLOVE EXAM NITRILE XL STR (GLOVE) IMPLANT
GLOVE EXAM NITRILE XS STR PU (GLOVE) IMPLANT
GOWN BRE IMP SLV AUR LG STRL (GOWN DISPOSABLE) ×2 IMPLANT
GOWN BRE IMP SLV AUR XL STRL (GOWN DISPOSABLE) ×2 IMPLANT
GOWN STRL REIN 2XL LVL4 (GOWN DISPOSABLE) ×2 IMPLANT
KIT BASIN OR (CUSTOM PROCEDURE TRAY) ×2 IMPLANT
KIT ROOM TURNOVER OR (KITS) ×2 IMPLANT
NDL HYPO 18GX1.5 BLUNT FILL (NEEDLE) IMPLANT
NEEDLE HYPO 18GX1.5 BLUNT FILL (NEEDLE) ×2 IMPLANT
NEEDLE HYPO 25X1 1.5 SAFETY (NEEDLE) ×2 IMPLANT
NS IRRIG 1000ML POUR BTL (IV SOLUTION) ×2 IMPLANT
PACK LAMINECTOMY NEURO (CUSTOM PROCEDURE TRAY) ×2 IMPLANT
PAD ARMBOARD 7.5X6 YLW CONV (MISCELLANEOUS) ×6 IMPLANT
RUBBERBAND STERILE (MISCELLANEOUS) ×4 IMPLANT
SPONGE GAUZE 4X4 12PLY (GAUZE/BANDAGES/DRESSINGS) IMPLANT
SPONGE SURGIFOAM ABS GEL SZ50 (HEMOSTASIS) ×2 IMPLANT
STAPLER SKIN PROX WIDE 3.9 (STAPLE) IMPLANT
STRIP CLOSURE SKIN 1/2X4 (GAUZE/BANDAGES/DRESSINGS) IMPLANT
SUT VIC AB 0 CT1 18XCR BRD8 (SUTURE) ×1 IMPLANT
SUT VIC AB 0 CT1 8-18 (SUTURE) ×2
SUT VIC AB 2-0 CT1 18 (SUTURE) ×2 IMPLANT
SUT VIC AB 3-0 SH 8-18 (SUTURE) ×2 IMPLANT
SYR 20ML ECCENTRIC (SYRINGE) ×2 IMPLANT
SYR 5ML LL (SYRINGE) ×1 IMPLANT
TOWEL OR 17X24 6PK STRL BLUE (TOWEL DISPOSABLE) ×2 IMPLANT
TOWEL OR 17X26 10 PK STRL BLUE (TOWEL DISPOSABLE) ×2 IMPLANT
WATER STERILE IRR 1000ML POUR (IV SOLUTION) ×2 IMPLANT

## 2011-10-19 NOTE — Transfer of Care (Signed)
Immediate Anesthesia Transfer of Care Note  Patient: [name]  Procedure(s) Performed: Procedure(s) (LRB): LUMBAR LAMINECTOMY/DECOMPRESSION MICRODISCECTOMY 2 LEVELS (Bilateral)  Patient Location: PACU  Anesthesia Type: General  Level of Consciousness: sedated  Airway & Oxygen Therapy: Patient Spontanous Breathing and Patient connected to nasal cannula oxygen  Post-op Assessment: Report given to PACU RN  Post vital signs: Reviewed and stable  Complications: No apparent anesthesia complications

## 2011-10-19 NOTE — Anesthesia Postprocedure Evaluation (Signed)
  Anesthesia Post-op Note  Patient: [name]  Procedure(s) Performed: Procedure(s) (LRB): LUMBAR LAMINECTOMY/DECOMPRESSION MICRODISCECTOMY 2 LEVELS (Bilateral)  Patient Location: PACU  Anesthesia Type: General  Level of Consciousness: awake  Airway and Oxygen Therapy: Patient Spontanous Breathing  Post-op Pain: mild  Post-op Assessment: Post-op Vital signs reviewed, Patient's Cardiovascular Status Stable, Respiratory Function Stable, Patent Airway, No signs of Nausea or vomiting and Pain level controlled  Post-op Vital Signs: stable  Complications: No apparent anesthesia complications

## 2011-10-19 NOTE — Anesthesia Procedure Notes (Signed)
Procedure Name: Intubation Date/Time: 10/19/2011 2:35 PM Performed by: Glendora Score A Pre-anesthesia Checklist: Patient identified, Emergency Drugs available, Suction available and Patient being monitored Patient Re-evaluated:Patient Re-evaluated prior to inductionOxygen Delivery Method: Circle system utilized Preoxygenation: Pre-oxygenation with 100% oxygen Intubation Type: IV induction Ventilation: Mask ventilation without difficulty and Oral airway inserted - appropriate to patient size Laryngoscope Size: Hyacinth Meeker and 2 Grade View: Grade I Tube type: Oral Tube size: 7.5 mm Number of attempts: 1 Airway Equipment and Method: Stylet Placement Confirmation: ETT inserted through vocal cords under direct vision,  positive ETCO2 and breath sounds checked- equal and bilateral Secured at: 23 cm Tube secured with: Tape Dental Injury: Teeth and Oropharynx as per pre-operative assessment

## 2011-10-19 NOTE — Progress Notes (Signed)
Sore in back, legs not painful.  Full strength bilateral plantar and dorsiflexion. Doing well.

## 2011-10-19 NOTE — Progress Notes (Signed)
Patient ID: [name], male   DOB: [birth year], 46 y.o.   MRN: [id number]  Awake, responds appropriately. Low back pain with transfer to bed - reassured. Good strength BLE. Wife present. Discussed surgery and plan going forward, including d/c instructions, with her during visit as she was unable to meet Dr. Venetia Maxon immediately post-op.  Georgiann Cocker, RN, BSN

## 2011-10-19 NOTE — Interval H&P Note (Signed)
History and Physical Interval Note:  10/19/2011 7:24 AM  [name]  has presented today for surgery, with the diagnosis of Lumbar spondylosis, Lumbago  The various methods of treatment have been discussed with the patient and family. After consideration of risks, benefits and other options for treatment, the patient has consented to  Procedure(s) (LRB): LUMBAR LAMINECTOMY/DECOMPRESSION MICRODISCECTOMY 2 LEVELS (N/A) as a surgical intervention .  The patient's history has been reviewed, patient examined, no change in status, stable for surgery.  I have reviewed the patient's chart and labs.  Questions were answered to the patient's satisfaction.     Pressley Tadesse D  Date of Initial H&P: 10/18/2011  History reviewed, patient examined, no change in status, stable for surgery.

## 2011-10-19 NOTE — Anesthesia Preprocedure Evaluation (Addendum)
Anesthesia Evaluation  Patient identified by MRN, date of birth, ID band Patient awake    Reviewed: Allergy & Precautions, H&P , NPO status , Patient's Chart, lab work & pertinent test results  Airway Mallampati: I  Neck ROM: Full    Dental No notable dental hx. (+) Teeth Intact and Dental Advisory Given   Pulmonary neg pulmonary ROS, shortness of breath, asthma ,  breath sounds clear to auscultation  Pulmonary exam normal       Cardiovascular negative cardio ROS  Rhythm:Regular Rate:Normal     Neuro/Psych  Headaches, PSYCHIATRIC DISORDERS Anxiety Depression    GI/Hepatic negative GI ROS, Neg liver ROS,   Endo/Other  negative endocrine ROS  Renal/GU negative Renal ROS  negative genitourinary   Musculoskeletal   Abdominal   Peds  Hematology negative hematology ROS (+)   Anesthesia Other Findings   Reproductive/Obstetrics negative OB ROS                          Anesthesia Physical Anesthesia Plan  ASA: II  Anesthesia Plan: General   Post-op Pain Management:    Induction: Intravenous  Airway Management Planned: Oral ETT  Additional Equipment:   Intra-op Plan:   Post-operative Plan: Extubation in OR  Informed Consent: I have reviewed the patients History and Physical, chart, labs and discussed the procedure including the risks, benefits and alternatives for the proposed anesthesia with the patient or authorized representative who has indicated his/her understanding and acceptance.   Dental advisory given  Plan Discussed with: CRNA, Anesthesiologist and Surgeon  Anesthesia Plan Comments:         Anesthesia Quick Evaluation

## 2011-10-19 NOTE — Op Note (Signed)
10/19/2011  5:01 PM  PATIENT:  [name]  46 y.o. male  PRE-OPERATIVE DIAGNOSIS:  Lumbar four-five, lumbar five sacral one spondylosis, herniated lumbar disc, stenosis, radiculopathy Lumbago  POST-OPERATIVE DIAGNOSIS:  Lumbar four-five, lumbar five sacral one spondylosis, herniated lumbar disc, stenosis, radiculopathy  Lumbago  PROCEDURE:  Procedure(s) (LRB): LUMBAR LAMINECTOMY/DECOMPRESSION MICRODISCECTOMY 2 LEVELS (Bilateral) L5/S1 and Right L4/5 with microdissection  SURGEON:  Surgeon(s) and Role:    * Maeola Harman, MD - Primary    * Clydene Fake, MD - Assisting  PHYSICIAN ASSISTANT:   ASSISTANTS: Poteat, RN   ANESTHESIA:   general  EBL:  Total I/O In: 1000 [I.V.:1000] Out: -   BLOOD ADMINISTERED:none  DRAINS: none   LOCAL MEDICATIONS USED:  LIDOCAINE   SPECIMEN:  No Specimen  DISPOSITION OF SPECIMEN:  N/A  COUNTS:  YES  TOURNIQUET:  * No tourniquets in log *  DICTATION: DICTATION: Patient has anL4-5 disc rupture on the right and on the left at the L5/S1 level with foramianl and lateral recess stenosis at L5/S1 on the right. It was elected to take him to surgery for right L4-5 foraminotomy and L5/S1 left discectomy and right L5/S1 decompression.  Procedure: Patient was brought to the operating room and following the smooth and uncomplicated induction of general endotracheal anesthesia he was placed in a prone position on the Wilson frame. Low back was prepped and draped in the usual sterile fashion with DuraPrep. Area of planned incision was infiltrated with local lidocaine. Incision was made in the midline and carried to the lumbodorsal fascia which was incised on the right side of midline. Subperiosteal dissection was performed exposing what was felt to be L45 and L5S1 levels. Intraoperative x-ray demonstrated marker probes at L5S1 and L4-5.  A hemi-semi-laminectomy of L5 was performed a high-speed drill and completed with Kerrison rongeurs and a generous  foraminotomy was performed overlying the superior aspect of the S1 lamina on the left. Ligamentum flavum was detached and removed in a piecemeal fashion and the S1 nerve root was decompressed laterally with removal of the superior aspect of the facet and ligamentum causing nerve root compression. The microscope was brought into the field and the S1 nerve root was mobilized medially. This exposed soft disc material and herniated disc material contained by ligament was removed. The interspace which appeared to be quite soft with a disrupted annulus overlying the interspace. As a result it was elected to further decompress the interspace and remove loose disc material and this was done with a variety Epstein curettes and pituitary rongeurs. The redundant annulus was also removed with 2 mm Kerrison rongeur.  A foraminotomy was then performed at L4/5 and L5/S1 levels on the right with laminotomies, removal of ligamentous tissue and decompression of all neural elements.  The disc was bulging at L45, but was not severely disrupted or causing significant nerve compression after the foraminotomy, so the interspace was not incised.  A similar decompression was performed at the L5S1 level on the right.  At this point it was felt that all neural elements were well decompressed and there was no evidence of residual loose disc material within the interspace. The wound was then irrigated . Hemostasis was assured with bipolar electrocautery and the interspace was irrigated with Depo-Medrol and fentanyl. The lumbodorsal fascia was closed with 0 Vicryl sutures the subcutaneous tissues reapproximated 2-0 Vicryl inverted sutures and the skin edges were reapproximated with 3-0 Vicryl subcuticular stitch. The wound is dressed with Dermabond. Patient was extubated  in the operating room and taken to recovery in stable and satisfactory condition having tolerated his operation well counts were correct at the end of the case.  PLAN OF CARE:  Admit for overnight observation  PATIENT DISPOSITION:  PACU - hemodynamically stable.   Delay start of Pharmacological VTE agent (>24hrs) due to surgical blood loss or risk of bleeding: yes

## 2011-10-19 NOTE — Preoperative (Signed)
Beta Blockers   Reason not to administer Beta Blockers:Not Applicable 

## 2011-10-20 MED ORDER — METAXALONE 800 MG PO TABS: 800.0000 mg | ORAL_TABLET | Freq: Three times a day (TID) | ORAL | Status: AC | PRN

## 2011-10-20 MED ORDER — OXYCODONE-ACETAMINOPHEN 10-325 MG PO TABS: 1.0000 | ORAL_TABLET | ORAL | Status: AC | PRN

## 2011-10-20 NOTE — Progress Notes (Signed)
Pt and wife given D/C instructions with Rx's, both verbalized understanding. Pt D/C'd home via wheelchair per MD order. Rema Fendt, RN

## 2011-10-20 NOTE — Discharge Summary (Signed)
Physician Discharge Summary  Patient ID: [name] MRN: [id number] DOB/AGE: October 19, 1965 46 y.o.  Admit date: 10/19/2011 Discharge date: 10/20/2011  Admission Diagnoses:  Discharge Diagnoses:  Active Problems:  * No active hospital problems. *    Discharged Condition: good  Hospital Course: Uncomplicated bilateral L5/S1 micvrodiscectomy and right L4/5 foarminotomy  Consults: None  Significant Diagnostic Studies: None  Treatments: surgery: bilateral L5/S1 micvrodiscectomy and right L4/5 foarminotomy  Discharge Exam: Blood pressure 125/78, pulse 98, temperature 98.6 F (37 C), temperature source Oral, resp. rate 18, SpO2 92.00%. Neurologic: Alert and oriented X 3, normal strength and tone. Normal symmetric reflexes. Normal coordination and gait Wound:CDI  Disposition: Home  Discharge Orders    Future Appointments: Provider: Department: Dept Phone: Center:   12/05/2011 2:00 PM [name], MD [facility] [phone] None     Medication List  As of 10/20/2011  9:00 AM   STOP taking these medications         lidocaine 5 %         TAKE these medications         chlorpheniramine-HYDROcodone 10-8 MG/5ML Lqcr   Commonly known as: TUSSIONEX   Take 5 mLs by mouth at bedtime as needed. For cough      fluticasone 50 MCG/ACT nasal spray   Commonly known as: FLONASE   Place 2 sprays into the nose daily as needed. For dry nasal passages      ibuprofen 800 MG tablet   Commonly known as: ADVIL,MOTRIN   Take 800 mg by mouth 2 (two) times daily as needed. For pain      levalbuterol 45 MCG/ACT inhaler   Commonly known as: XOPENEX HFA   Inhale 2 puffs into the lungs every 6 (six) hours as needed. For shortness of breath and wheezing      metaxalone 800 MG tablet   Commonly known as: SKELAXIN   Take 1 tablet (800 mg total) by mouth every 8 (eight) hours as needed for pain.      methocarbamol 500 MG tablet   Commonly known as: ROBAXIN   Take 500 mg by mouth every 8  (eight) hours as needed. For muscle spasms      mirtazapine 15 MG tablet   Commonly known as: REMERON   Take 15 mg by mouth at bedtime.      Mometasone Furo-Formoterol Fum 200-5 MCG/ACT Aero   Inhale 2 puffs into the lungs 2 (two) times daily.      montelukast 10 MG tablet   Commonly known as: SINGULAIR   Take 10 mg by mouth at bedtime. For asthma      oxyCODONE-acetaminophen 7.5-325 MG per tablet   Commonly known as: PERCOCET   Take 1 tablet by mouth every 8 (eight) hours as needed. For pain      oxyCODONE-acetaminophen 10-325 MG per tablet   Commonly known as: PERCOCET   Take 1-2 tablets by mouth every 4 (four) hours as needed for pain.      sertraline 100 MG tablet   Commonly known as: ZOLOFT   Take 100 mg by mouth daily.      zolpidem 10 MG tablet   Commonly known as: AMBIEN   Take 10 mg by mouth at bedtime as needed. For sleep             Signed: Dorian Heckle, MD 10/20/2011, 9:00 AM

## 2011-10-20 NOTE — Progress Notes (Signed)
Subjective: Patient reports doing well  Objective: Vital signs in last 24 hours: Temp:  [97.1 F (36.2 C)-98.6 F (37 C)] 98.6 F (37 C) (08/10 0359) Pulse Rate:  [59-82] 67  (08/10 0359) Resp:  [10-20] 18  (08/10 0359) BP: (114-135)/(62-85) 114/66 mmHg (08/10 0359) SpO2:  [94 %-100 %] 96 % (08/10 0359)  Intake/Output from previous day: 08/09 0701 - 08/10 0700 In: 2400 [P.O.:100; I.V.:2300] Out: 325 [Urine:275; Blood:50] Intake/Output this shift: Total I/O In: -  Out: 275 [Urine:275]  Physical Exam: Full strength, no numbness.  Dressing CDI  Lab Results: No results found for this basename: WBC:2,HGB:2,HCT:2,PLT:2 in the last 72 hours BMET No results found for this basename: NA:2,K:2,CL:2,CO2:2,GLUCOSE:2,BUN:2,CREATININE:2,CALCIUM:2 in the last 72 hours  Studies/Results: Dg Lumbar Spine 1 View  10/19/2011  *RADIOLOGY REPORT*  Clinical Data: L4-5 laminectomy  LUMBAR SPINE - 1 VIEW  Comparison: Lumbar spine radiographs dated 08/20/2011  Findings: Surgical probes at L4-5 and L5-S1.  IMPRESSION: Lumbar levels as above.  Original Report Authenticated By: Charline Bills, M.D.    Assessment/Plan: Doing well.  D/C home    LOS: 1 day    Dorian Heckle, MD 10/20/2011, 4:46 AM

## 2011-10-20 NOTE — Progress Notes (Signed)
As above.

## 2011-10-22 ENCOUNTER — Encounter (HOSPITAL_COMMUNITY): Payer: Self-pay | Admitting: Neurosurgery

## 2011-11-19 ENCOUNTER — Telehealth: Payer: Self-pay | Admitting: Pulmonary Disease

## 2011-11-19 NOTE — Telephone Encounter (Signed)
I spoke with pt and he stated he had back surgery on 10/19/11. They advised him the best PT is for him to walk. Pt stated he still experiences SOB and wheezing when he tries to walk. Per pt he can only walk about 1/2 block then he has to stop and sit down to catch his breathe. I offered him an appt to come in and see TP this afternoon but he refused. He stated he has an appt with VS on 12/05/11. He uses the dulera 200 2 puffs BID and has xopenex inhaler. He thinks his inhaler may need to be changed. He is requesting further recs until his appt 12/05/11. Please advise Dr. Craige Cotta thanks

## 2011-11-19 NOTE — Telephone Encounter (Signed)
I spoke with pt and is aware of VS recs. He advised me I would have to call his worker comp case manager [name] at [phone] to see if this would be covered under workers comp. I called [name] and spoke with him and also advised him of what VS had recommended. He advised me to send an order over to him for this so he can look into this bc he was not sure. I spoke with pt and made him aware of this. He stated once this is approved [name] will get in contact with him and let him know and then he will call us and let us know. Nothing further was needed

## 2011-11-19 NOTE — Telephone Encounter (Signed)
Please arrange for home nebulizer.  He will need to use pulmicort 0.25 mg nebulized bid, dispense 60 vials with 5 refills.  He will need xopenex 0.63 mg nebulized q6h prn, dispense 120 with 5 refills.  He can continue using xopenex HFA two puffs as needed.  He is to continue singulair 10 mg qhs.  He is to stop using dulera while using nebulizer.  He should call back if no better.  Otherwise will address further at [facility] on 9/25.

## 2011-11-21 ENCOUNTER — Telehealth: Payer: Self-pay | Admitting: Pulmonary Disease

## 2011-11-21 NOTE — Telephone Encounter (Signed)
Called, spoke with Neita Carp, nurse case manager.  States he did receive the order for nebulizer and order for neb meds that where faxed by Mindy on a prescription pad.  However, he is needing additional clarification as to what pt is to do with dulera, etc while on neb meds.  Per phone note from 11/19/11:  [name], MD 11/19/2011 12:14 PM Signed  Please arrange for home nebulizer. He will need to use pulmicort 0.25 mg nebulized bid, dispense 60 vials with 5 refills. He will need xopenex 0.63 mg nebulized q6h prn, dispense 120 with 5 refills.  He can continue using xopenex HFA two puffs as needed. He is to continue singulair 10 mg qhs. He is to stop using dulera while using nebulizer.  He should call back if no better. Otherwise will address further at [facility] on 9/25. ------  Informed Brett Canales of this.  He did not receive this information with the fax.  He is requesting this be faxed to his attn at [phone].  Advised I would print copy of this phone msg and fax to him with requested info documented.  He verbalized understanding.  Nothing further needed at this time.

## 2011-12-05 ENCOUNTER — Encounter: Payer: Self-pay | Admitting: Pulmonary Disease

## 2011-12-05 ENCOUNTER — Ambulatory Visit (INDEPENDENT_AMBULATORY_CARE_PROVIDER_SITE_OTHER): Payer: Worker's Compensation | Admitting: Pulmonary Disease

## 2011-12-05 VITALS — BP 122/70 | HR 94 | Temp 97.9°F | Ht 69.0 in | Wt 270.8 lb

## 2011-12-05 DIAGNOSIS — R059 Cough, unspecified: Secondary | ICD-10-CM

## 2011-12-05 DIAGNOSIS — R05 Cough: Secondary | ICD-10-CM

## 2011-12-05 DIAGNOSIS — R0989 Other specified symptoms and signs involving the circulatory and respiratory systems: Secondary | ICD-10-CM

## 2011-12-05 DIAGNOSIS — R0683 Snoring: Secondary | ICD-10-CM

## 2011-12-05 DIAGNOSIS — R0609 Other forms of dyspnea: Secondary | ICD-10-CM

## 2011-12-05 DIAGNOSIS — J329 Chronic sinusitis, unspecified: Secondary | ICD-10-CM

## 2011-12-05 DIAGNOSIS — J45909 Unspecified asthma, uncomplicated: Secondary | ICD-10-CM

## 2011-12-05 MED ORDER — PREDNISONE 10 MG PO TABS: ORAL_TABLET | ORAL | Status: DC

## 2011-12-05 MED ORDER — AZITHROMYCIN 250 MG PO TABS: ORAL_TABLET | ORAL | Status: AC
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
## 2011-12-05 NOTE — Patient Instructions (Signed)
Zithromax 250 mg pill>>2 pills on first day, then 1 pill daily for 4 days Prednisone 10 mg pill>>4 pills for 2 days, 3 pills for 2 days, 2 pills for 2 days, 1 pill for 2 days Follow up in 2 to 3 weeks

## 2011-12-06 NOTE — Assessment & Plan Note (Signed)
Multifactorial related to post-nasal drip, asthma, and reflux.
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
## 2011-12-06 NOTE — Assessment & Plan Note (Signed)
He is to continue nasal irrigation, flonase, and singulair.

## 2011-12-06 NOTE — Assessment & Plan Note (Signed)
He has symptoms suggestive of sleep apnea.  He would like to see how he does with change to his inhaler regimen, and then reassess whether he needs evaluation for sleep apnea. 

## 2012-01-09 ENCOUNTER — Encounter: Payer: Self-pay | Admitting: Pulmonary Disease

## 2012-01-09 ENCOUNTER — Ambulatory Visit (INDEPENDENT_AMBULATORY_CARE_PROVIDER_SITE_OTHER): Payer: Worker's Compensation | Admitting: Pulmonary Disease

## 2012-01-09 VITALS — BP 110/76 | HR 81 | Temp 98.0°F | Ht 69.0 in | Wt 270.4 lb

## 2012-01-09 DIAGNOSIS — R05 Cough: Secondary | ICD-10-CM

## 2012-01-09 DIAGNOSIS — R0683 Snoring: Secondary | ICD-10-CM

## 2012-01-09 DIAGNOSIS — J45909 Unspecified asthma, uncomplicated: Secondary | ICD-10-CM

## 2012-01-09 DIAGNOSIS — J329 Chronic sinusitis, unspecified: Secondary | ICD-10-CM

## 2012-01-09 DIAGNOSIS — R0609 Other forms of dyspnea: Secondary | ICD-10-CM

## 2012-01-09 DIAGNOSIS — R059 Cough, unspecified: Secondary | ICD-10-CM

## 2012-01-09 MED ORDER — HYDROCOD POLST-CHLORPHEN POLST 10-8 MG/5ML PO LQCR: 5.0000 mL | Freq: Every evening | ORAL | Status: DC | PRN

## 2012-01-09 MED ORDER — FLUTICASONE PROPIONATE 50 MCG/ACT NA SUSP: 2.0000 | Freq: Every day | NASAL | Status: DC | PRN

## 2012-01-09 MED ORDER — LEVALBUTEROL HCL 0.63 MG/3ML IN NEBU: 1.0000 | INHALATION_SOLUTION | Freq: Three times a day (TID) | RESPIRATORY_TRACT | Status: DC

## 2012-01-09 MED ORDER — BUDESONIDE 0.25 MG/2ML IN SUSP: 0.2500 mg | Freq: Three times a day (TID) | RESPIRATORY_TRACT | Status: DC

## 2012-01-09 NOTE — Patient Instructions (Signed)
Xopenex one vial nebulized three times per day Pulmicort one vial nebulized three times per day Xopenex puffer up to 4 times per day as needed Continue singulair 10 mg nightly Flonase two sprays each nostril daily as needed Tussionex as needed for cough Follow up in 3 months

## 2012-01-09 NOTE — Assessment & Plan Note (Signed)
Refilled his tussionex.
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
## 2012-01-09 NOTE — Assessment & Plan Note (Signed)
He has symptoms suggestive of sleep apnea.  He would like to see how he does with change to his inhaler regimen, and then reassess whether he needs evaluation for sleep apnea. 

## 2012-01-09 NOTE — Assessment & Plan Note (Signed)
Improved since last visit, but still having symptoms.  Will change his regimen to xopenex and pulmicort tid.  He can continue xopenex hfa as needed in between.

## 2012-01-09 NOTE — Assessment & Plan Note (Signed)
Will refill his flonase 

## 2012-02-18 ENCOUNTER — Telehealth: Payer: Self-pay | Admitting: Pulmonary Disease

## 2012-02-18 NOTE — Telephone Encounter (Signed)
Please inform [name] that based on his results from his breathing test he would not qualify for handicap parking related to his asthma.  His breathing tests numbers would have to be much worse to qualify.

## 2012-02-18 NOTE — Telephone Encounter (Signed)
VS, are you okay with signing a handicap placard for patient? Thanks.

## 2012-02-19 NOTE — Telephone Encounter (Signed)
[id number]

## 2012-02-20 NOTE — Telephone Encounter (Signed)
Pt aware of recs from Dr. Craige Cotta and verbalized understanding.

## 2012-03-14 ENCOUNTER — Other Ambulatory Visit (HOSPITAL_COMMUNITY): Payer: Self-pay | Admitting: Neurosurgery

## 2012-03-14 DIAGNOSIS — M545 Low back pain, unspecified: Secondary | ICD-10-CM

## 2012-03-18 ENCOUNTER — Other Ambulatory Visit: Payer: Self-pay | Admitting: Pulmonary Disease

## 2012-03-25 ENCOUNTER — Ambulatory Visit (INDEPENDENT_AMBULATORY_CARE_PROVIDER_SITE_OTHER): Payer: Worker's Compensation | Admitting: Pulmonary Disease

## 2012-03-25 ENCOUNTER — Encounter: Payer: Self-pay | Admitting: Pulmonary Disease

## 2012-03-25 VITALS — BP 114/62 | HR 98 | Temp 97.7°F | Ht 69.0 in | Wt 278.4 lb

## 2012-03-25 DIAGNOSIS — R05 Cough: Secondary | ICD-10-CM

## 2012-03-25 DIAGNOSIS — J329 Chronic sinusitis, unspecified: Secondary | ICD-10-CM

## 2012-03-25 DIAGNOSIS — J45909 Unspecified asthma, uncomplicated: Secondary | ICD-10-CM

## 2012-03-25 DIAGNOSIS — R059 Cough, unspecified: Secondary | ICD-10-CM

## 2012-03-25 DIAGNOSIS — R0600 Dyspnea, unspecified: Secondary | ICD-10-CM | POA: Insufficient documentation

## 2012-03-25 DIAGNOSIS — R0683 Snoring: Secondary | ICD-10-CM

## 2012-03-25 DIAGNOSIS — R0609 Other forms of dyspnea: Secondary | ICD-10-CM

## 2012-03-25 MED ORDER — LEVALBUTEROL TARTRATE 45 MCG/ACT IN AERO: 2.0000 | INHALATION_SPRAY | Freq: Four times a day (QID) | RESPIRATORY_TRACT | Status: DC | PRN

## 2012-03-25 MED ORDER — HYDROCOD POLST-CHLORPHEN POLST 10-8 MG/5ML PO LQCR: 5.0000 mL | Freq: Every evening | ORAL | Status: DC | PRN

## 2012-03-25 NOTE — Patient Instructions (Signed)
Will schedule sleep study and cardiopulmonary stress test >> will call with results Follow up in 3 months

## 2012-03-25 NOTE — Assessment & Plan Note (Signed)
I have refilled his tussionex.

## 2012-03-25 NOTE — Assessment & Plan Note (Signed)
He is to continue fluticasone.
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
## 2012-03-25 NOTE — Assessment & Plan Note (Signed)
He is to continue his current nebulizer regimen and singulair.  I have sent refill for xopenex HFA.

## 2012-03-25 NOTE — Assessment & Plan Note (Addendum)
He continues to have symptoms of dyspnea.  Some of this is certainly related to his asthma.  His exercise capacity has been limited due to his asthma, back injury, and resulting weight gain.  I am concerned that he has component of decondition as a result of these.  To further assess I would like to arrange for cardiopulmonary stress testing.  Advised that he can proceed with functional capacity testing with Dr. Venetia Maxon.

## 2012-03-26 ENCOUNTER — Other Ambulatory Visit (HOSPITAL_COMMUNITY): Payer: Worker's Compensation

## 2012-03-26 ENCOUNTER — Ambulatory Visit (HOSPITAL_COMMUNITY): Payer: Worker's Compensation

## 2012-03-28 ENCOUNTER — Ambulatory Visit (HOSPITAL_COMMUNITY): Admission: RE | Admit: 2012-03-28 | Payer: Worker's Compensation | Source: Ambulatory Visit

## 2012-05-15 ENCOUNTER — Telehealth: Payer: Self-pay | Admitting: *Deleted

## 2012-05-15 NOTE — Telephone Encounter (Signed)
I also have an insurance form for pt that he needs to sign. I will need to speak with pt personally when he returns my call.

## 2012-05-15 NOTE — Telephone Encounter (Signed)
I called to initiate PA for pt's xopenex through his WC. It was denied. i was advised pt's adjuster through his workers comp denied this. He would need to call [phone] since his adjuster denied the xopenex hfa. This # is for the [facility] contact center. I called pt to make him aware of this but had to [facility] x1. Will await call back

## 2012-05-15 NOTE — Telephone Encounter (Signed)
I spoke with pt and is aware. He stated he will come today to sign form. Pt will ask for me so once he signs this I can make a copy. Nothing further was needed

## 2012-05-23 ENCOUNTER — Inpatient Hospital Stay (HOSPITAL_COMMUNITY): Admission: RE | Admit: 2012-05-23 | Payer: Self-pay | Source: Ambulatory Visit

## 2012-06-02 ENCOUNTER — Ambulatory Visit (HOSPITAL_COMMUNITY): Payer: 59 | Attending: Pulmonary Disease

## 2012-06-02 DIAGNOSIS — R0600 Dyspnea, unspecified: Secondary | ICD-10-CM

## 2012-06-02 DIAGNOSIS — R0989 Other specified symptoms and signs involving the circulatory and respiratory systems: Secondary | ICD-10-CM | POA: Insufficient documentation

## 2012-06-02 DIAGNOSIS — R0609 Other forms of dyspnea: Secondary | ICD-10-CM | POA: Insufficient documentation

## 2012-06-05 ENCOUNTER — Ambulatory Visit (INDEPENDENT_AMBULATORY_CARE_PROVIDER_SITE_OTHER): Payer: 59 | Admitting: Pulmonary Disease

## 2012-06-05 ENCOUNTER — Encounter: Payer: Self-pay | Admitting: Pulmonary Disease

## 2012-06-05 VITALS — BP 104/70 | HR 85 | Temp 98.1°F | Ht 69.0 in | Wt 287.0 lb

## 2012-06-05 DIAGNOSIS — J329 Chronic sinusitis, unspecified: Secondary | ICD-10-CM

## 2012-06-05 DIAGNOSIS — R079 Chest pain, unspecified: Secondary | ICD-10-CM

## 2012-06-05 DIAGNOSIS — R0609 Other forms of dyspnea: Secondary | ICD-10-CM

## 2012-06-05 DIAGNOSIS — R0683 Snoring: Secondary | ICD-10-CM

## 2012-06-05 DIAGNOSIS — J45909 Unspecified asthma, uncomplicated: Secondary | ICD-10-CM

## 2012-06-05 DIAGNOSIS — R0600 Dyspnea, unspecified: Secondary | ICD-10-CM

## 2012-06-05 MED ORDER — MONTELUKAST SODIUM 10 MG PO TABS: 10.0000 mg | ORAL_TABLET | Freq: Every day | ORAL | Status: DC

## 2012-06-05 MED ORDER — FLUTICASONE PROPIONATE 50 MCG/ACT NA SUSP: 2.0000 | Freq: Every day | NASAL | Status: DC | PRN

## 2012-06-05 NOTE — Patient Instructions (Signed)
Will arrange for evaluation by Cardiology again Will call with results of exercise test Follow up in 3 months
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
## 2012-06-08 DIAGNOSIS — R079 Chest pain, unspecified: Secondary | ICD-10-CM | POA: Insufficient documentation
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
## 2012-06-08 NOTE — Assessment & Plan Note (Signed)
He is to continue fluticasone.

## 2012-06-08 NOTE — Assessment & Plan Note (Signed)
He has recurrent chest pain with exertion.  I am not sure this is related to his pulmonary disease.  He has been evaluated by Dr. Eden Emms with cardiology in the past.  Will arrange for follow up with cardiology to further assess.

## 2012-06-08 NOTE — Assessment & Plan Note (Signed)
He continues to have symptoms of dyspnea.  Some of this is certainly related to his asthma.  His exercise capacity has been limited due to his asthma, back injury, and resulting weight gain.  I am concerned that he has component of decondition as a result of these.  Await results of CPST done earlier this month.

## 2012-06-08 NOTE — Assessment & Plan Note (Signed)
He is to continue his current nebulizer regimen and singulair.

## 2012-06-19 ENCOUNTER — Encounter (HOSPITAL_COMMUNITY): Payer: Self-pay

## 2012-06-19 ENCOUNTER — Ambulatory Visit (HOSPITAL_COMMUNITY)
Admission: RE | Admit: 2012-06-19 | Discharge: 2012-06-19 | Disposition: A | Discharge status: Home / Self Care | Payer: 59 | Source: Ambulatory Visit | Attending: Neurosurgery | Admitting: Neurosurgery

## 2012-06-19 DIAGNOSIS — G4700 Insomnia, unspecified: Secondary | ICD-10-CM | POA: Insufficient documentation

## 2012-06-19 DIAGNOSIS — J45909 Unspecified asthma, uncomplicated: Secondary | ICD-10-CM | POA: Insufficient documentation

## 2012-06-19 DIAGNOSIS — F4310 Post-traumatic stress disorder, unspecified: Secondary | ICD-10-CM | POA: Insufficient documentation

## 2012-06-19 DIAGNOSIS — M25819 Other specified joint disorders, unspecified shoulder: Secondary | ICD-10-CM | POA: Insufficient documentation

## 2012-06-19 DIAGNOSIS — M545 Low back pain, unspecified: Secondary | ICD-10-CM | POA: Insufficient documentation

## 2012-06-19 DIAGNOSIS — F411 Generalized anxiety disorder: Secondary | ICD-10-CM | POA: Insufficient documentation

## 2012-06-19 DIAGNOSIS — Z79899 Other long term (current) drug therapy: Secondary | ICD-10-CM | POA: Insufficient documentation

## 2012-06-19 MED ORDER — MIDAZOLAM HCL 2 MG/2ML IJ SOLN
1.0000 mg | INTRAMUSCULAR | Status: DC | PRN
  Administered 2012-06-19 (×2): 2 mg via INTRAVENOUS

## 2012-06-19 MED ORDER — MIDAZOLAM HCL 2 MG/2ML IJ SOLN
INTRAMUSCULAR | Status: AC
  Filled 2012-06-19: qty 2

## 2012-06-19 MED ORDER — GADOBENATE DIMEGLUMINE 529 MG/ML IV SOLN: 20.0000 mL | Freq: Once | INTRAVENOUS | Status: AC | PRN

## 2012-06-19 MED ORDER — MIDAZOLAM HCL 2 MG/2ML IJ SOLN
INTRAMUSCULAR | Status: AC
  Filled 2012-06-19: qty 6

## 2012-06-19 MED ORDER — MIDAZOLAM HCL 2 MG/2ML IJ SOLN
INTRAMUSCULAR | Status: AC
  Filled 2012-06-19: qty 4

## 2012-06-19 MED ORDER — FENTANYL CITRATE 0.05 MG/ML IJ SOLN
25.0000 ug | INTRAMUSCULAR | Status: DC | PRN
  Administered 2012-06-19: 25 ug via INTRAVENOUS
  Administered 2012-06-19: 50 ug via INTRAVENOUS

## 2012-06-19 MED ORDER — FENTANYL CITRATE 0.05 MG/ML IJ SOLN
INTRAMUSCULAR | Status: AC
  Filled 2012-06-19: qty 4
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
## 2012-06-24 ENCOUNTER — Telehealth: Payer: Self-pay | Admitting: Pulmonary Disease

## 2012-06-24 ENCOUNTER — Encounter: Payer: Self-pay | Admitting: Cardiovascular Disease

## 2012-06-24 ENCOUNTER — Ambulatory Visit (INDEPENDENT_AMBULATORY_CARE_PROVIDER_SITE_OTHER): Payer: 59 | Admitting: Cardiovascular Disease

## 2012-06-24 VITALS — BP 117/80 | HR 73 | Ht 69.0 in | Wt 281.0 lb

## 2012-06-24 DIAGNOSIS — R0600 Dyspnea, unspecified: Secondary | ICD-10-CM

## 2012-06-24 DIAGNOSIS — R079 Chest pain, unspecified: Secondary | ICD-10-CM

## 2012-06-24 NOTE — Assessment & Plan Note (Signed)
Atypical with normal myovue 2013 and normal cardiopulmonary cardiac portoin 3/14.  NSAI's  F/U in a year

## 2012-06-24 NOTE — Telephone Encounter (Signed)
CPST 06/02/12 >> submaximal exercise, respiratory/ventilatory limitation, ?VCD with variable intra/extra thoracic obstruction on flow volume loop, the slope of his Ve/VO2 and Ve/VCO2 fall and rise in tandem, suggesting anxiety/pain playing a role.   Will try calling pt again later.
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
## 2012-06-24 NOTE — Assessment & Plan Note (Signed)
Related to lung issues Cardpulm stress test with normla CO response.  Discussed weight loss F/U Dr Craige Cotta

## 2012-07-16 ENCOUNTER — Telehealth: Payer: Self-pay | Admitting: Pulmonary Disease

## 2012-07-16 NOTE — Telephone Encounter (Signed)
lmomtcb x1 for pt 

## 2012-07-17 NOTE — Telephone Encounter (Signed)
LMOMTCB X2 FOR PT

## 2012-07-18 NOTE — Telephone Encounter (Signed)
Left message explaining that CPST did not reveal any additional information about the cause of his dyspnea.  Advised him to call back to discuss in more detail.  Based on CPST results there are no planned changes to his current therapy.

## 2012-07-18 NOTE — Telephone Encounter (Signed)
Copied from phone note  on 06-24-12:    [name], MD at 06/24/2012 11:16 AM    Status: Signed             CPST 06/02/12 >> submaximal exercise, respiratory/ventilatory limitation, ?VCD with variable intra/extra thoracic obstruction on flow volume loop, the slope of his Ve/VO2 and Ve/VCO2 fall and rise in tandem, suggesting anxiety/pain playing a role.  Will try calling pt again later.   I [id number] to advise the pt that Dr. Craige Cotta attempted to call him and what is a good number for him to be reached at. Dr. Craige Cotta do you still want to speak directly to the pt or can we give the results of the cpst? Carron Curie, CMA

## 2012-07-21 NOTE — Telephone Encounter (Signed)
Called spoke with patient He stated that he returned VS' call [phone] after hours This is a good number to return the call Will forward to VS

## 2012-07-21 NOTE — Telephone Encounter (Signed)
Left message.  Will try calling pt again in AM of 07/22/12.

## 2012-07-22 NOTE — Telephone Encounter (Signed)
Results of CPST discussed with pt.

## 2012-07-31 ENCOUNTER — Ambulatory Visit (INDEPENDENT_AMBULATORY_CARE_PROVIDER_SITE_OTHER): Payer: 59 | Admitting: Family Medicine

## 2012-07-31 VITALS — BP 130/83 | HR 94 | Temp 98.4°F | Resp 16 | Ht 69.0 in | Wt 274.0 lb

## 2012-07-31 DIAGNOSIS — R197 Diarrhea, unspecified: Secondary | ICD-10-CM

## 2012-07-31 DIAGNOSIS — K529 Noninfective gastroenteritis and colitis, unspecified: Secondary | ICD-10-CM

## 2012-07-31 DIAGNOSIS — R112 Nausea with vomiting, unspecified: Secondary | ICD-10-CM

## 2012-07-31 DIAGNOSIS — K5289 Other specified noninfective gastroenteritis and colitis: Secondary | ICD-10-CM

## 2012-07-31 MED ORDER — ONDANSETRON 4 MG PO TBDP
4.0000 mg | ORAL_TABLET | Freq: Once | ORAL | Status: AC
  Administered 2012-07-31: 4 mg via ORAL

## 2012-07-31 MED ORDER — ONDANSETRON 4 MG PO TBDP: 4.0000 mg | ORAL_TABLET | Freq: Three times a day (TID) | ORAL | Status: DC | PRN
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
## 2012-07-31 NOTE — Patient Instructions (Signed)
Small sips of fluids frequently, zofran if needed for nausea.  Start with pepto bismol, immodium if needed. Avoid sugar containing drinks. Return to the clinic or go to the nearest emergency room if any of your symptoms worsen or new symptoms occur.  Gastroenteritis:  Diarrhea Infections caused by germs (bacterial) or a virus commonly cause diarrhea. Your caregiver has determined that with time, rest and fluids, the diarrhea should improve. In general, eat normally while drinking more water than usual. Although water may prevent dehydration, it does not contain salt and minerals (electrolytes). Broths, weak tea without caffeine and oral rehydration solutions (ORS) replace fluids and electrolytes. Small amounts of fluids should be taken frequently. Large amounts at one time may not be tolerated. Plain water may be harmful in infants and the elderly. Oral rehydrating solutions (ORS) are available at pharmacies and grocery stores. ORS replace water and important electrolytes in proper proportions. Sports drinks are not as effective as ORS and may be harmful due to sugars worsening diarrhea.  ORS is especially recommended for use in children with diarrhea. As a general guideline for children, replace any new fluid losses from diarrhea and/or vomiting with ORS as follows:   If your child weighs 22 pounds or under (10 kg or less), give 60-120 mL ( -  cup or 2 - 4 ounces) of ORS for each episode of diarrheal stool or vomiting episode.   If your child weighs more than 22 pounds (more than 10 kgs), give 120-240 mL ( - 1 cup or 4 - 8 ounces) of ORS for each diarrheal stool or episode of vomiting.   While correcting for dehydration, children should eat normally. However, foods high in sugar should be avoided because this may worsen diarrhea. Large amounts of carbonated soft drinks, juice, gelatin desserts and other highly sugared drinks should be avoided.   After correction of dehydration, other liquids that  are appealing to the child may be added. Children should drink small amounts of fluids frequently and fluids should be increased as tolerated. Children should drink enough fluids to keep urine clear or pale yellow.   Adults should eat normally while drinking more fluids than usual. Drink small amounts of fluids frequently and increase as tolerated. Drink enough fluids to keep urine clear or pale yellow. Broths, weak decaffeinated tea, lemon lime soft drinks (allowed to go flat) and ORS replace fluids and electrolytes.   Avoid:   Carbonated drinks.   Juice.   Extremely hot or cold fluids.   Caffeine drinks.   Fatty, greasy foods.   Alcohol.   Tobacco.   Too much intake of anything at one time.   Gelatin desserts.   Probiotics are active cultures of beneficial bacteria. They may lessen the amount and number of diarrheal stools in adults. Probiotics can be found in yogurt with active cultures and in supplements.   Wash hands well to avoid spreading bacteria and virus.   Anti-diarrheal medications are not recommended for infants and children.   Only take over-the-counter or prescription medicines for pain, discomfort or fever as directed by your caregiver. Do not give aspirin to children because it may cause Reye's Syndrome.   For adults, ask your caregiver if you should continue all prescribed and over-the-counter medicines.   If your caregiver has given you a follow-up appointment, it is very important to keep that appointment. Not keeping the appointment could result in a chronic or permanent injury, and disability. If there is any problem keeping the appointment, you  must call back to this facility for assistance.  SEEK IMMEDIATE MEDICAL CARE IF:   You or your child is unable to keep fluids down or other symptoms or problems become worse in spite of treatment.   Vomiting or diarrhea develops and becomes persistent.   There is vomiting of blood or bile (green material).    There is blood in the stool or the stools are black and tarry.   There is no urine output in 6-8 hours or there is only a small amount of very dark urine.   Abdominal pain develops, increases or localizes.   You have a fever.   Your baby is older than 3 months with a rectal temperature of 102 F (38.9 C) or higher.   Your baby is 56 months old or younger with a rectal temperature of 100.4 F (38 C) or higher.   You or your child develops excessive weakness, dizziness, fainting or extreme thirst.   You or your child develops a rash, stiff neck, severe headache or become irritable or sleepy and difficult to awaken.  MAKE SURE YOU:   Understand these instructions.   Will watch your condition.   Will get help right away if you are not doing well or get worse.  Document Released: 02/16/2002 Document Revised: 02/15/2011 Document Reviewed: 01/03/2009 [facility] Patient Information 2012 [place], [place].  Nausea and Vomiting Nausea is a sick feeling that often comes before throwing up (vomiting). Vomiting is a reflex where stomach contents come out of your mouth. Vomiting can cause severe loss of body fluids (dehydration). Children and elderly adults can become dehydrated quickly, especially if they also have diarrhea. Nausea and vomiting are symptoms of a condition or disease. It is important to find the cause of your symptoms. CAUSES   Direct irritation of the stomach lining. This irritation can result from increased acid production (gastroesophageal reflux disease), infection, food poisoning, taking certain medicines (such as nonsteroidal anti-inflammatory drugs), alcohol use, or tobacco use.   Signals from the brain.These signals could be caused by a headache, heat exposure, an inner ear disturbance, increased pressure in the brain from injury, infection, a tumor, or a concussion, pain, emotional stimulus, or metabolic problems.   An obstruction in the gastrointestinal tract (bowel  obstruction).   Illnesses such as diabetes, hepatitis, gallbladder problems, appendicitis, kidney problems, cancer, sepsis, atypical symptoms of a heart attack, or eating disorders.   Medical treatments such as chemotherapy and radiation.   Receiving medicine that makes you sleep (general anesthetic) during surgery.  DIAGNOSIS Your caregiver may ask for tests to be done if the problems do not improve after a few days. Tests may also be done if symptoms are severe or if the reason for the nausea and vomiting is not clear. Tests may include:  Urine tests.   Blood tests.   Stool tests.   Cultures (to look for evidence of infection).   X-rays or other imaging studies.  Test results can help your caregiver make decisions about treatment or the need for additional tests. TREATMENT You need to stay well hydrated. Drink frequently but in small amounts.You may wish to drink water, sports drinks, clear broth, or eat frozen ice pops or gelatin dessert to help stay hydrated.When you eat, eating slowly may help prevent nausea.There are also some antinausea medicines that may help prevent nausea. HOME CARE INSTRUCTIONS   Take all medicine as directed by your caregiver.   If you do not have an appetite, do not force yourself to  eat. However, you must continue to drink fluids.   If you have an appetite, eat a normal diet unless your caregiver tells you differently.   Eat a variety of complex carbohydrates (rice, wheat, potatoes, bread), lean meats, yogurt, fruits, and vegetables.   Avoid high-fat foods because they are more difficult to digest.   Drink enough water and fluids to keep your urine clear or pale yellow.   If you are dehydrated, ask your caregiver for specific rehydration instructions. Signs of dehydration may include:   Severe thirst.   Dry lips and mouth.   Dizziness.   Dark urine.   Decreasing urine frequency and amount.   Confusion.   Rapid breathing or pulse.   SEEK IMMEDIATE MEDICAL CARE IF:   You have blood or brown flecks (like coffee grounds) in your vomit.   You have black or bloody stools.   You have a severe headache or stiff neck.   You are confused.   You have severe abdominal pain.   You have chest pain or trouble breathing.   You do not urinate at least once every 8 hours.   You develop cold or clammy skin.   You continue to vomit for longer than 24 to 48 hours.   You have a fever.  MAKE SURE YOU:   Understand these instructions.   Will watch your condition.   Will get help right away if you are not doing well or get worse.  Document Released: 02/26/2005 Document Revised: 02/15/2011 Document Reviewed: 07/26/2010 [facility] Patient Information 2012 [place], [place].  Return to the clinic or go to the nearest emergency room if any of your symptoms worsen or new symptoms occur.

## 2012-09-02 ENCOUNTER — Ambulatory Visit (INDEPENDENT_AMBULATORY_CARE_PROVIDER_SITE_OTHER): Payer: 59 | Admitting: Pulmonary Disease

## 2012-09-02 ENCOUNTER — Encounter: Payer: Self-pay | Admitting: Pulmonary Disease

## 2012-09-02 VITALS — BP 110/76 | HR 99 | Temp 97.9°F | Ht 69.0 in | Wt 275.0 lb

## 2012-09-02 DIAGNOSIS — R0989 Other specified symptoms and signs involving the circulatory and respiratory systems: Secondary | ICD-10-CM

## 2012-09-02 DIAGNOSIS — J329 Chronic sinusitis, unspecified: Secondary | ICD-10-CM

## 2012-09-02 DIAGNOSIS — J45909 Unspecified asthma, uncomplicated: Secondary | ICD-10-CM

## 2012-09-02 DIAGNOSIS — R05 Cough: Secondary | ICD-10-CM

## 2012-09-02 DIAGNOSIS — R0609 Other forms of dyspnea: Secondary | ICD-10-CM

## 2012-09-02 DIAGNOSIS — R0600 Dyspnea, unspecified: Secondary | ICD-10-CM

## 2012-09-02 DIAGNOSIS — R059 Cough, unspecified: Secondary | ICD-10-CM

## 2012-09-02 DIAGNOSIS — J4520 Mild intermittent asthma, uncomplicated: Secondary | ICD-10-CM

## 2012-09-02 MED ORDER — BUDESONIDE 0.25 MG/2ML IN SUSP: 0.2500 mg | Freq: Two times a day (BID) | RESPIRATORY_TRACT | Status: DC

## 2012-09-02 MED ORDER — LEVALBUTEROL HCL 0.63 MG/3ML IN NEBU: 1.0000 | INHALATION_SOLUTION | Freq: Two times a day (BID) | RESPIRATORY_TRACT | Status: DC

## 2012-09-02 MED ORDER — LEVALBUTEROL TARTRATE 45 MCG/ACT IN AERO: 2.0000 | INHALATION_SPRAY | Freq: Four times a day (QID) | RESPIRATORY_TRACT | Status: DC | PRN

## 2012-09-02 NOTE — Patient Instructions (Signed)
Follow up in 6 months 
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
## 2012-09-02 NOTE — Assessment & Plan Note (Signed)
He is to continue fluticasone as needed.

## 2012-09-02 NOTE — Assessment & Plan Note (Signed)
Stable on current regimen of pulmicort, singulair, and xopenex.  Explained that he would not be able to do any job related activities that include walking, bending, lifting, or stooping.  He has informed me that he has been approved for disability, and no longer needs forms for disability insurance to be completed.
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
## 2012-09-02 NOTE — Assessment & Plan Note (Signed)
He can use mucinex prn.

## 2012-09-03 ENCOUNTER — Ambulatory Visit: Payer: 59 | Admitting: Pulmonary Disease

## 2012-09-11 ENCOUNTER — Telehealth: Payer: Self-pay | Admitting: Pulmonary Disease

## 2012-09-11 NOTE — Telephone Encounter (Signed)
rec'd from [employer] 2 pages,.Forward to Dr.[name]

## 2012-09-23 ENCOUNTER — Telehealth: Payer: Self-pay | Admitting: Pulmonary Disease

## 2012-09-23 NOTE — Telephone Encounter (Signed)
LMTCB

## 2012-09-23 NOTE — Telephone Encounter (Signed)
Received fax from insurance company that xopenex HFA is not covered.    Will have my nurse inform pt of this and change xopenex HFA to proair two puffs as needed up to four times per day, dispense 1 inhaler with 5 refills.

## 2012-09-24 MED ORDER — ALBUTEROL SULFATE HFA 108 (90 BASE) MCG/ACT IN AERS: 2.0000 | INHALATION_SPRAY | Freq: Four times a day (QID) | RESPIRATORY_TRACT | Status: DC | PRN

## 2012-09-24 NOTE — Telephone Encounter (Signed)
Pt returned call. Emily E McAlister °

## 2012-09-24 NOTE — Telephone Encounter (Signed)
Pt aware RX has been sent 

## 2013-01-12 DIAGNOSIS — M5137 Other intervertebral disc degeneration, lumbosacral region: Secondary | ICD-10-CM | POA: Diagnosis not present

## 2013-01-12 DIAGNOSIS — M5126 Other intervertebral disc displacement, lumbar region: Secondary | ICD-10-CM | POA: Diagnosis not present

## 2013-01-12 DIAGNOSIS — M545 Low back pain, unspecified: Secondary | ICD-10-CM | POA: Diagnosis not present

## 2013-01-12 DIAGNOSIS — IMO0002 Reserved for concepts with insufficient information to code with codable children: Secondary | ICD-10-CM | POA: Diagnosis not present

## 2013-01-13 ENCOUNTER — Telehealth: Payer: Self-pay | Admitting: Pulmonary Disease

## 2013-01-13 ENCOUNTER — Emergency Department (HOSPITAL_COMMUNITY): Payer: 59

## 2013-01-13 ENCOUNTER — Emergency Department (HOSPITAL_COMMUNITY)
Admission: EM | Admit: 2013-01-13 | Discharge: 2013-01-13 | Disposition: A | Discharge status: Home / Self Care | Payer: 59 | Attending: Emergency Medicine | Admitting: Emergency Medicine

## 2013-01-13 ENCOUNTER — Encounter (HOSPITAL_COMMUNITY): Payer: Self-pay | Admitting: Emergency Medicine

## 2013-01-13 DIAGNOSIS — Z87828 Personal history of other (healed) physical injury and trauma: Secondary | ICD-10-CM | POA: Insufficient documentation

## 2013-01-13 DIAGNOSIS — R0600 Dyspnea, unspecified: Secondary | ICD-10-CM

## 2013-01-13 DIAGNOSIS — Z8719 Personal history of other diseases of the digestive system: Secondary | ICD-10-CM | POA: Insufficient documentation

## 2013-01-13 DIAGNOSIS — J45901 Unspecified asthma with (acute) exacerbation: Secondary | ICD-10-CM | POA: Insufficient documentation

## 2013-01-13 DIAGNOSIS — R42 Dizziness and giddiness: Secondary | ICD-10-CM | POA: Insufficient documentation

## 2013-01-13 DIAGNOSIS — Z79899 Other long term (current) drug therapy: Secondary | ICD-10-CM | POA: Insufficient documentation

## 2013-01-13 DIAGNOSIS — F3289 Other specified depressive episodes: Secondary | ICD-10-CM | POA: Insufficient documentation

## 2013-01-13 DIAGNOSIS — F329 Major depressive disorder, single episode, unspecified: Secondary | ICD-10-CM | POA: Insufficient documentation

## 2013-01-13 DIAGNOSIS — F411 Generalized anxiety disorder: Secondary | ICD-10-CM | POA: Insufficient documentation

## 2013-01-13 DIAGNOSIS — R110 Nausea: Secondary | ICD-10-CM | POA: Insufficient documentation

## 2013-01-13 DIAGNOSIS — R079 Chest pain, unspecified: Secondary | ICD-10-CM | POA: Insufficient documentation

## 2013-01-13 LAB — POCT I-STAT TROPONIN I: Troponin i, poc: 0 ng/mL (ref 0.00–0.08)

## 2013-01-13 LAB — CBC WITH DIFFERENTIAL/PLATELET
Basophils Absolute: 0 K/uL (ref 0.0–0.1)
Basophils Relative: 1 % (ref 0–1)
Eosinophils Absolute: 0.1 K/uL (ref 0.0–0.7)
Eosinophils Relative: 3 % (ref 0–5)
HCT: 43.6 % (ref 39.0–52.0)
Hemoglobin: 15 g/dL (ref 13.0–17.0)
Lymphocytes Relative: 41 % (ref 12–46)
Lymphs Abs: 1.7 K/uL (ref 0.7–4.0)
MCH: 28.5 pg (ref 26.0–34.0)
MCHC: 34.4 g/dL (ref 30.0–36.0)
MCV: 82.9 fL (ref 78.0–100.0)
Monocytes Absolute: 0.4 K/uL (ref 0.1–1.0)
Monocytes Relative: 9 % (ref 3–12)
Neutro Abs: 1.9 K/uL (ref 1.7–7.7)
Neutrophils Relative %: 46 % (ref 43–77)
Platelets: 150 K/uL (ref 150–400)
RBC: 5.26 MIL/uL (ref 4.22–5.81)
RDW: 13.6 % (ref 11.5–15.5)
WBC: 4.2 K/uL (ref 4.0–10.5)

## 2013-01-13 LAB — COMPREHENSIVE METABOLIC PANEL
ALT: 19 U/L (ref 0–53)
AST: 18 U/L (ref 0–37)
Albumin: 4 g/dL (ref 3.5–5.2)
Alkaline Phosphatase: 69 U/L (ref 39–117)
BUN: 9 mg/dL (ref 6–23)
CO2: 23 mEq/L (ref 19–32)
Calcium: 9.4 mg/dL (ref 8.4–10.5)
Chloride: 104 mEq/L (ref 96–112)
Creatinine, Ser: 0.99 mg/dL (ref 0.50–1.35)
GFR calc Af Amer: >90 mL/min (ref >90)
GFR calc non Af Amer: >90 mL/min (ref >90)
Glucose, Bld: 120 mg/dL — ABNORMAL HIGH (ref 70–99)
Potassium: 3.3 mEq/L — ABNORMAL LOW (ref 3.5–5.1)
Sodium: 138 mEq/L (ref 135–145)
Total Bilirubin: 0.3 mg/dL (ref 0.3–1.2)
Total Protein: 7.8 g/dL (ref 6.0–8.3)

## 2013-01-13 LAB — PRO B NATRIURETIC PEPTIDE: Pro B Natriuretic peptide (BNP): <5 pg/mL (ref 0–125)

## 2013-01-13 MED ORDER — METHYLPREDNISOLONE SODIUM SUCC 125 MG IJ SOLR
125.0000 mg | Freq: Once | INTRAMUSCULAR | Status: AC
  Administered 2013-01-13: 125 mg via INTRAVENOUS
  Filled 2013-01-13: qty 2

## 2013-01-13 MED ORDER — KETOROLAC TROMETHAMINE 30 MG/ML IJ SOLN
30.0000 mg | Freq: Once | INTRAMUSCULAR | Status: AC
  Administered 2013-01-13: 30 mg via INTRAVENOUS
  Filled 2013-01-13: qty 1

## 2013-01-13 MED ORDER — IBUPROFEN 600 MG PO TABS: 600.0000 mg | ORAL_TABLET | Freq: Three times a day (TID) | ORAL | Status: AC

## 2013-01-13 MED ORDER — ALBUTEROL SULFATE (5 MG/ML) 0.5% IN NEBU
5.0000 mg | INHALATION_SOLUTION | Freq: Once | RESPIRATORY_TRACT | Status: AC
  Administered 2013-01-13: 5 mg via RESPIRATORY_TRACT
  Filled 2013-01-13: qty 1

## 2013-01-13 MED ORDER — PREDNISONE 10 MG PO TABS: 40.0000 mg | ORAL_TABLET | Freq: Every day | ORAL | Status: AC

## 2013-01-13 NOTE — Telephone Encounter (Signed)
Noted  
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
## 2013-01-13 NOTE — ED Notes (Signed)
Asthmatic patient started with SOB this morning and chest pain intermittently.  Patient used inhalers but they did not help. Chest pain is left rib pain radiating to axillary region.  Patient describes pain as sharp. Patient having some nausea but no vomiting.

## 2013-01-13 NOTE — Telephone Encounter (Signed)
Called, spoke with pt's wife.  Reports they are actually already at [facility] ER.  Nothing further needed from our office at this.  Will sign off and route to VS as FYI.

## 2013-01-15 ENCOUNTER — Encounter: Payer: Self-pay | Admitting: Adult Health

## 2013-01-15 ENCOUNTER — Ambulatory Visit (INDEPENDENT_AMBULATORY_CARE_PROVIDER_SITE_OTHER): Payer: 59 | Admitting: Adult Health

## 2013-01-15 VITALS — BP 108/68 | HR 78 | Temp 97.8°F | Ht 69.0 in | Wt 280.6 lb

## 2013-01-15 DIAGNOSIS — R079 Chest pain, unspecified: Secondary | ICD-10-CM

## 2013-01-15 NOTE — Patient Instructions (Signed)
GERD diet  Begin Prilosec 40mg  daily before meal   Begin Zyrtec 10mg  At bedtime  For drainage.  Continue on Pulmicort Neb Twice daily  , rinse after use.  Please contact office for sooner follow up if symptoms do not improve or worsen or seek emergency care  Follow up Dr. Craige Cotta  In 4 weeks and As needed

## 2013-01-19 NOTE — Progress Notes (Signed)
  Subjective:    Patient ID: [name], male    DOB: Jan 14, 1966, 47 y.o.   MRN: [id number]  HPI 01/15/13 ER follow up  Pt returns for ER follow up .  Seen in ER on 01/13/13 for chest pain.  ?thought to have asthma flare, rx steroid taper. He  Stopped because was not helping and made him nervous and heart to race.  Had chest tightness , went to ER on 01/13/13  Cardiac Workup was neg w/ neg CXR, troponin, EKG.  Labs were unrevealing w/ neg BNP and CBC.  Does have reflux and indigestion esp at night.  No dysphagia or n/v/d.  Has some nasal drainage , minimal cough.  Does have mild chest congestion, dry cough, chest tightness, runny nose w/ clear drainage x2days. No hemoptysis, exertional chest pain, orthopnea, leg swelling or edema.  Has had cardiac workup in past w/ nml myoview in 2013.     Tests: PFT 01/03/11>>FEV1 3.79(77%), FEV1% 87, TLC 5.69(85%), DLCO 78%, positive BD response. Echo 03/22/11>>mild LVH, EF 60 to 65%, grade 1 diastolic dysfx Myoview 03/27/11>>Normal stress nuclear study. PFT ([facility]) 06/04/11>>FEV1 2.86 (84%), FEV1% 86, TLC 4.50 (67%), DLCO 58%, +BD response CT sinus 09/27/11>>mild changes of chronic sinusitis, marked leftward nasal septal deviation, with eccentric vomer spur, right concha bullosa  CT chest with contrast 09/27/11>>atelectasis, hypodense area in liver. CPST 06/02/12 >> submaximal exercise, respiratory/ventilatory limitation, ?VCD with variable intra/extra thoracic obstruction on flow volume loop, the slope of his Ve/VO2 and Ve/VCO2 fall and rise in tandem, suggesting anxiety/pain playing a role.     Review of Systems Constitutional:   No  weight loss, night sweats,  Fevers, chills, fatigue, or  lassitude.  HEENT:   No headaches,  Difficulty swallowing,  Tooth/dental problems, or  Sore throat,                No sneezing, itching, ear ache,  +nasal congestion, post nasal drip,   CV:  No chest pain,  Orthopnea, PND, swelling in lower  extremities, anasarca, dizziness, palpitations, syncope.   GI +heartburn, indigestion, NO abdominal pain, nausea, vomiting, diarrhea, change in bowel habits, loss of appetite, bloody stools.   Resp:   No chest wall deformity  Skin: no rash or lesions.  GU: no dysuria, change in color of urine, no urgency or frequency.  No flank pain, no hematuria   MS:  No joint pain or swelling.  No decreased range of motion.  No back pain.  Psych:  No change in mood or affect. No depression or anxiety.  No memory loss.         Objective:   Physical Exam  GEN: A/Ox3; pleasant , NAD, obese   HEENT:  [place]/AT,  EACs-clear, TMs-wnl, NOSE-clear, THROAT-clear, no lesions, no postnasal drip or exudate noted.   NECK:  Supple w/ fair ROM; no JVD; normal carotid impulses w/o bruits; no thyromegaly or nodules palpated; no lymphadenopathy.  RESP  Clear  P & A; w/o, wheezes/ rales/ or rhonchi.no accessory muscle use, no dullness to percussion  CARD:  RRR, no m/r/g  , no peripheral edema, pulses intact, no cyanosis or clubbing.  GI:   Soft & nt; nml bowel sounds; no organomegaly or masses detected.  Musco: Warm bil, no deformities or joint swelling noted.   Neuro: alert, no focal deficits noted.    Skin: Warm, no lesions or rashes        Assessment & Plan:

## 2013-01-19 NOTE — Assessment & Plan Note (Signed)
Atypical chest pain with ER workup unrevealing >? GERD  CXR neg  Card enzymes neg , cbc/cmet /bnp essentially unremarkable except for low K+ EKG neg   Plan

## 2013-02-16 ENCOUNTER — Encounter: Payer: Self-pay | Admitting: Internal Medicine

## 2013-02-16 ENCOUNTER — Ambulatory Visit (INDEPENDENT_AMBULATORY_CARE_PROVIDER_SITE_OTHER): Payer: Medicare Other | Admitting: Internal Medicine

## 2013-02-16 VITALS — BP 112/76 | HR 76 | Temp 98.1°F | Ht 69.0 in | Wt 281.0 lb

## 2013-02-16 DIAGNOSIS — J45909 Unspecified asthma, uncomplicated: Secondary | ICD-10-CM | POA: Diagnosis not present

## 2013-02-16 MED ORDER — PANTOPRAZOLE SODIUM 40 MG PO TBEC: 40.0000 mg | DELAYED_RELEASE_TABLET | Freq: Every day | ORAL | Status: DC

## 2013-02-16 MED ORDER — FAMOTIDINE 20 MG PO TABS: ORAL_TABLET | ORAL | Status: DC

## 2013-02-16 MED ORDER — MOMETASONE FURO-FORMOTEROL FUM 100-5 MCG/ACT IN AERO: INHALATION_SPRAY | RESPIRATORY_TRACT | Status: DC

## 2013-02-16 NOTE — Progress Notes (Signed)
Subjective:    Patient ID: [name], male    DOB: [birth year]    MRN: [id number]   Brief patient profile:  47 yobm with sob > cough dating back to 08/24/10    History of Present Illness  01/15/13 ER follow up  Pt returns for ER follow up .  Seen in ER on 01/13/13 for chest pain.  ?thought to have asthma flare, rx steroid taper. He  Stopped because was not helping and made him nervous and heart to race.  Had chest tightness , went to ER on 01/13/13  Cardiac Workup was neg w/ neg CXR, troponin, EKG.  Labs were unrevealing w/ neg BNP and CBC.  Does have reflux and indigestion esp at night.  No dysphagia or n/v/d.  Has some nasal drainage , minimal cough.  Does have mild chest congestion, dry cough, chest tightness, runny nose w/ clear drainage x2days. No hemoptysis, exertional chest pain, orthopnea, leg swelling or edema.  Has had cardiac workup in past w/ nml myoview in 2013.  rec GERD diet  Begin Prilosec 40mg  daily before meal   Begin Zyrtec 10mg  At bedtime  For drainage.  Continue on Pulmicort Neb Twice daily  , rinse after use.  Please contact office for sooner follow up if symptoms do not improve or worsen or seek emergency care   02/16/2013 f/u ov/[facility] [place] re: dtc Chief Complaint  Patient presents with  . Acute Visit    Pt c/o increased SOB on and off since last appt 01/15/13. He states SOB can occue with or without exertion. He also c/o wheezing and fatigue. He is using recue inhaler 5-6 times per day.   last used saba 2 h prior to OV   Variably Sore throat, has sense of mucus but no sputum production, sleeping better p cough syrup and most of his saba use is daytime.  No obvious pattern in  day to day or daytime variabilty or assoc chronic cough or cp or chest tightness, subjective wheeze overt sinus or hb symptoms. No unusual exp hx or h/o childhood pna/ asthma or knowledge of premature birth.  Sleeping ok without nocturnal  or early am exacerbation  of respiratory  c/o's  or need for noct saba. Also denies any obvious fluctuation of symptoms with weather or environmental changes or other aggravating or alleviating factors except as outlined above   Current Medications, Allergies, Complete Past Medical History, Past Surgical History, Family History, and Social History were reviewed in [employer] record.  ROS  The following are not active complaints unless bolded sore throat, dysphagia, dental problems, itching, sneezing,  nasal congestion or excess/ purulent secretions, ear ache,   fever, chills, sweats, unintended wt loss, pleuritic or exertional cp, hemoptysis,  orthopnea pnd or leg swelling, presyncope, palpitations, heartburn, abdominal pain, anorexia, nausea, vomiting, diarrhea  or change in bowel or urinary habits, change in stools or urine, dysuria,hematuria,  rash, arthralgias, visual complaints, headache, numbness weakness or ataxia or problems with walking or coordination,  change in mood/affect or memory.          Tests: PFT 01/03/11>>FEV1 3.79(77%), FEV1% 87, TLC 5.69(85%), DLCO 78%, positive BD response. Echo 03/22/11>>mild LVH, EF 60 to 65%, grade 1 diastolic dysfx Myoview 03/27/11>>Normal stress nuclear study. PFT ([facility]) 06/04/11>>FEV1 2.86 (84%), FEV1% 86, TLC 4.50 (67%), DLCO 58%, +BD response - note f/v loop is not physiologic CT sinus 09/27/11>>mild changes of chronic sinusitis, marked leftward nasal septal deviation, with eccentric vomer spur, right concha  bullosa  CT chest with contrast 09/27/11>>atelectasis, hypodense area in liver. CPST 06/02/12 >> submaximal exercise, respiratory/ventilatory limitation, ?VCD with variable intra/extra thoracic obstruction on flow volume loop, the slope of his Ve/VO2 and Ve/VCO2 fall and rise in tandem, suggesting anxiety/pain playing a role.              Objective:   Physical Exam  GEN: A/Ox3; very somber,   Obese ambulatory  bm nad  Wt Readings from Last 3  Encounters:  02/16/13 281 lb (127.461 kg)  01/15/13 280 lb 9.6 oz (127.279 kg)  01/13/13 260 lb (117.935 kg)      HEENT:  [place]/AT,  EACs-clear, TMs-wnl, NOSE-clear, THROAT-clear, no lesions, no postnasal drip or exudate noted.   NECK:  Supple w/ fair ROM; no JVD; normal carotid impulses w/o bruits; no thyromegaly or nodules palpated; no lymphadenopathy.  RESP  Clear  P & A; w/o, wheezes/ rales/ or rhonchi.no accessory muscle use, no dullness to percussion  CARD:  RRR, no m/r/g  , no peripheral edema, pulses intact, no cyanosis or clubbing.  GI:   Soft & nt; nml bowel sounds; no organomegaly or masses detected.  Musco: Warm bil, no deformities or joint swelling noted.   Neuro: alert, no focal deficits noted.    Skin: Warm, no lesions or rashes        Assessment & Plan:

## 2013-02-16 NOTE — Assessment & Plan Note (Signed)
Symptoms are markedly disproportionate to objective findings and not clear this is all a  lung problem but pt does appear to have difficult airway management issues. DDX of  difficult airways managment all start with A and  include Adherence, Ace Inhibitors, Acid Reflux, Active Sinus Disease, Alpha 1 Antitripsin deficiency, Anxiety masquerading as Airways dz,  ABPA,  allergy(esp in young), Aspiration (esp in elderly), Adverse effects of DPI,  Active smokers, plus two Bs  = Bronchiectasis and Beta blocker use..and one C= CHF  In this case Adherence is the biggest issue and starts with  inability to use HFA effectively and also  understand that SABA treats the symptoms but doesn't get to the underlying problem (inflammation).  I used  the analogy of putting steroid cream on a rash to help explain the meaning of topical therapy and the need to get the drug to the target tissue.  He showed very poor insight into his meds/dependent on his wife to "get them right" and I could not confirm what exactly he's taking.  - The proper method of use, as well as anticipated side effects, of a metered-dose inhaler are discussed and demonstrated to the patient. Improved effectiveness after extensive coaching during this visit to a level of approximately  75% from a basline of only 25%  .? Acid (or non-acid) GERD > always difficult to exclude as up to 75% of pts in some series report no assoc GI/ Heartburn symptoms> rec max (24h)  acid suppression and diet restrictions/ reviewed and instructions given in writing  ? Anxiety/ depression/ deconditioning usually dx of exclusion but much higher based on today's exam and cpst reviewed

## 2013-02-16 NOTE — Patient Instructions (Addendum)
Dulera 100 Take 2 puffs first thing in am and then another 2 puffs about 12 hours later And stop pulmocort for now    Only use your albuterol as a rescue medication to be used if you can't catch your breath by resting or doing a relaxed purse lip breathing pattern.  - The less you use it, the better it will work when you need it. - Ok to use up to 2 puffs every 4 hours if you must but call for immediate appointment if use goes up over your usual need - Don't leave home without it !!  (think of it like your spare tire for your car)   Work on inhaler technique:  relax and gently blow all the way out then take a nice smooth deep breath back in, triggering the inhaler at same time you start breathing in.  Hold for up to 5 seconds if you can.  Rinse and gargle with water when done  GERD (REFLUX)  is an extremely common cause of respiratory symptoms, many times with no significant heartburn at all.    It can be treated with medication, but also with lifestyle changes including avoidance of late meals, excessive alcohol, smoking cessation, and avoid fatty foods, chocolate, peppermint, colas, red wine, and acidic juices such as orange juice.  NO MINT OR MENTHOL PRODUCTS SO NO COUGH DROPS  USE SUGARLESS CANDY INSTEAD (jolley ranchers or Stover's)  NO OIL BASED VITAMINS - use powdered substitutes.  Keep appt with Dr Craige Cotta but bring all medications/inhlaers  with you so he can judge whether or not they are working effectively  Late add:   Pantoprazole (protonix) 40 mg   Take 30-60 min before first meal of the day and Pepcid 20 mg one bedtime until return to office - this is the best way to tell whether stomach acid is contributing to your problem.

## 2013-02-17 ENCOUNTER — Telehealth: Payer: Self-pay | Admitting: *Deleted

## 2013-02-17 NOTE — Telephone Encounter (Signed)
Message copied by Christen Butter on Tue Feb 17, 2013 11:19 AM ------      Message from: Sandrea Hughs B      Created: Mon Feb 16, 2013  9:01 PM       I though he was already on max gerd rx but does not appear to be so called in protonix pepcid ------

## 2013-02-17 NOTE — Telephone Encounter (Signed)
Pt is aware that Protonix has been sent to his pharmacy.

## 2013-02-17 NOTE — Telephone Encounter (Signed)
LMTCB so can inform pt that acid suppression meds were sent to his pharmacy

## 2013-02-25 ENCOUNTER — Encounter: Payer: Self-pay | Admitting: Pulmonary Disease

## 2013-02-25 ENCOUNTER — Ambulatory Visit (INDEPENDENT_AMBULATORY_CARE_PROVIDER_SITE_OTHER): Payer: Medicare Other | Admitting: Pulmonary Disease

## 2013-02-25 VITALS — BP 116/80 | HR 84 | Ht 69.0 in | Wt 285.0 lb

## 2013-02-25 DIAGNOSIS — J45998 Other asthma: Secondary | ICD-10-CM

## 2013-02-25 DIAGNOSIS — R0989 Other specified symptoms and signs involving the circulatory and respiratory systems: Secondary | ICD-10-CM | POA: Diagnosis not present

## 2013-02-25 DIAGNOSIS — J45909 Unspecified asthma, uncomplicated: Secondary | ICD-10-CM

## 2013-02-25 DIAGNOSIS — R0609 Other forms of dyspnea: Secondary | ICD-10-CM

## 2013-02-25 DIAGNOSIS — R0600 Dyspnea, unspecified: Secondary | ICD-10-CM

## 2013-02-25 NOTE — Patient Instructions (Signed)
Follow up in 6 months 
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
## 2013-03-05 NOTE — Assessment & Plan Note (Signed)
Will continue dulera, singulair, and prn albuterol. 
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
## 2013-05-04 DIAGNOSIS — M940 Chondrocostal junction syndrome [Tietze]: Secondary | ICD-10-CM | POA: Diagnosis not present

## 2013-05-07 DIAGNOSIS — J019 Acute sinusitis, unspecified: Secondary | ICD-10-CM | POA: Diagnosis not present

## 2013-06-10 ENCOUNTER — Encounter: Payer: Self-pay | Admitting: Cardiovascular Disease

## 2013-06-10 ENCOUNTER — Ambulatory Visit (INDEPENDENT_AMBULATORY_CARE_PROVIDER_SITE_OTHER): Payer: Medicare Other | Admitting: Cardiovascular Disease

## 2013-06-10 VITALS — BP 118/70 | HR 80 | Ht 69.0 in | Wt 286.0 lb

## 2013-06-10 DIAGNOSIS — R0989 Other specified symptoms and signs involving the circulatory and respiratory systems: Secondary | ICD-10-CM

## 2013-06-10 DIAGNOSIS — R0600 Dyspnea, unspecified: Secondary | ICD-10-CM

## 2013-06-10 DIAGNOSIS — R0609 Other forms of dyspnea: Secondary | ICD-10-CM | POA: Diagnosis not present

## 2013-06-10 DIAGNOSIS — R079 Chest pain, unspecified: Secondary | ICD-10-CM

## 2013-06-10 NOTE — Patient Instructions (Signed)
Your physician wants you to follow-up in: YEAR WITH DR NISHAN  You will receive a reminder letter in the mail two months in advance. If you don't receive a letter, please call our office to schedule the follow-up appointment.  Your physician recommends that you continue on your current medications as directed. Please refer to the Current Medication list given to you today. 

## 2013-06-10 NOTE — Assessment & Plan Note (Signed)
Resolved no evidence of CAD Observe
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
## 2013-06-22 DIAGNOSIS — Z136 Encounter for screening for cardiovascular disorders: Secondary | ICD-10-CM | POA: Diagnosis not present

## 2013-06-22 DIAGNOSIS — K625 Hemorrhage of anus and rectum: Secondary | ICD-10-CM | POA: Diagnosis not present

## 2013-06-22 DIAGNOSIS — Z0000 Encounter for general adult medical examination without abnormal findings: Secondary | ICD-10-CM | POA: Diagnosis not present

## 2013-06-22 DIAGNOSIS — N529 Male erectile dysfunction, unspecified: Secondary | ICD-10-CM | POA: Diagnosis not present

## 2013-06-22 DIAGNOSIS — H9190 Unspecified hearing loss, unspecified ear: Secondary | ICD-10-CM | POA: Diagnosis not present

## 2013-06-22 DIAGNOSIS — Z125 Encounter for screening for malignant neoplasm of prostate: Secondary | ICD-10-CM | POA: Diagnosis not present

## 2013-06-22 DIAGNOSIS — Z23 Encounter for immunization: Secondary | ICD-10-CM | POA: Diagnosis not present

## 2013-07-21 DIAGNOSIS — H5210 Myopia, unspecified eye: Secondary | ICD-10-CM | POA: Diagnosis not present

## 2013-07-21 DIAGNOSIS — H52209 Unspecified astigmatism, unspecified eye: Secondary | ICD-10-CM | POA: Diagnosis not present

## 2013-07-22 ENCOUNTER — Emergency Department (HOSPITAL_COMMUNITY)
Admission: EM | Admit: 2013-07-22 | Discharge: 2013-07-22 | Disposition: A | Discharge status: Home / Self Care | Payer: Medicare Other | Attending: Emergency Medicine | Admitting: Emergency Medicine

## 2013-07-22 ENCOUNTER — Encounter (HOSPITAL_COMMUNITY): Payer: Self-pay | Admitting: Emergency Medicine

## 2013-07-22 DIAGNOSIS — K625 Hemorrhage of anus and rectum: Secondary | ICD-10-CM | POA: Diagnosis not present

## 2013-07-22 DIAGNOSIS — F3289 Other specified depressive episodes: Secondary | ICD-10-CM | POA: Diagnosis not present

## 2013-07-22 DIAGNOSIS — K921 Melena: Secondary | ICD-10-CM | POA: Diagnosis not present

## 2013-07-22 DIAGNOSIS — G4700 Insomnia, unspecified: Secondary | ICD-10-CM | POA: Diagnosis not present

## 2013-07-22 DIAGNOSIS — F329 Major depressive disorder, single episode, unspecified: Secondary | ICD-10-CM | POA: Diagnosis not present

## 2013-07-22 DIAGNOSIS — F411 Generalized anxiety disorder: Secondary | ICD-10-CM | POA: Diagnosis not present

## 2013-07-22 DIAGNOSIS — F4310 Post-traumatic stress disorder, unspecified: Secondary | ICD-10-CM | POA: Insufficient documentation

## 2013-07-22 DIAGNOSIS — Z8739 Personal history of other diseases of the musculoskeletal system and connective tissue: Secondary | ICD-10-CM | POA: Diagnosis not present

## 2013-07-22 DIAGNOSIS — Z79899 Other long term (current) drug therapy: Secondary | ICD-10-CM | POA: Insufficient documentation

## 2013-07-22 DIAGNOSIS — Z8679 Personal history of other diseases of the circulatory system: Secondary | ICD-10-CM | POA: Diagnosis not present

## 2013-07-22 DIAGNOSIS — Z87828 Personal history of other (healed) physical injury and trauma: Secondary | ICD-10-CM | POA: Diagnosis not present

## 2013-07-22 DIAGNOSIS — J45909 Unspecified asthma, uncomplicated: Secondary | ICD-10-CM | POA: Insufficient documentation

## 2013-07-22 DIAGNOSIS — R42 Dizziness and giddiness: Secondary | ICD-10-CM | POA: Diagnosis not present

## 2013-07-22 LAB — COMPREHENSIVE METABOLIC PANEL
ALT: 21 U/L (ref 0–53)
AST: 21 U/L (ref 0–37)
Albumin: 4 g/dL (ref 3.5–5.2)
Alkaline Phosphatase: 70 U/L (ref 39–117)
BUN: 11 mg/dL (ref 6–23)
CO2: 23 mEq/L (ref 19–32)
Calcium: 9.2 mg/dL (ref 8.4–10.5)
Chloride: 104 mEq/L (ref 96–112)
Creatinine, Ser: 1.01 mg/dL (ref 0.50–1.35)
GFR calc Af Amer: >90 mL/min (ref >90)
GFR calc non Af Amer: 88 mL/min — ABNORMAL LOW (ref >90)
Glucose, Bld: 87 mg/dL (ref 70–99)
Potassium: 4.2 mEq/L (ref 3.7–5.3)
Sodium: 139 mEq/L (ref 137–147)
Total Bilirubin: 0.2 mg/dL — ABNORMAL LOW (ref 0.3–1.2)
Total Protein: 7.6 g/dL (ref 6.0–8.3)

## 2013-07-22 LAB — CBC
HCT: 41.9 % (ref 39.0–52.0)
Hemoglobin: 14.5 g/dL (ref 13.0–17.0)
MCH: 28.7 pg (ref 26.0–34.0)
MCHC: 34.6 g/dL (ref 30.0–36.0)
MCV: 82.8 fL (ref 78.0–100.0)
Platelets: 155 10*3/uL (ref 150–400)
RBC: 5.06 MIL/uL (ref 4.22–5.81)
RDW: 13.5 % (ref 11.5–15.5)
WBC: 3.9 10*3/uL — ABNORMAL LOW (ref 4.0–10.5)

## 2013-07-22 LAB — POC OCCULT BLOOD, ED: Fecal Occult Bld: POSITIVE — AB

## 2013-07-22 LAB — TYPE AND SCREEN
ABO/RH(D): O POS
Antibody Screen: NEGATIVE

## 2013-07-22 LAB — ABO/RH: ABO/RH(D): O POS

## 2013-07-22 MED ORDER — PRAMOXINE HCL 1 % RE FOAM: 1.0000 "application " | Freq: Three times a day (TID) | RECTAL | Status: DC | PRN

## 2013-07-22 NOTE — Discharge Instructions (Signed)

## 2013-07-22 NOTE — ED Notes (Signed)
Pt escorted to discharge window. Verbalized understanding discharge instructions. In no acute distress.   
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
## 2013-07-22 NOTE — ED Notes (Signed)
Pt states that he started having BRB per rectum this morning.  Diarrhea mixed with blood.  States there are pea sized clots on the tissue when he wipes.  No pain anywhere.

## 2013-07-24 ENCOUNTER — Encounter (HOSPITAL_COMMUNITY): Payer: Self-pay | Admitting: Pharmacy Technician

## 2013-07-24 DIAGNOSIS — Z8371 Family history of colonic polyps: Secondary | ICD-10-CM | POA: Diagnosis not present

## 2013-07-24 DIAGNOSIS — K921 Melena: Secondary | ICD-10-CM | POA: Diagnosis not present

## 2013-07-27 ENCOUNTER — Other Ambulatory Visit: Payer: Self-pay | Admitting: Gastroenterology

## 2013-07-27 ENCOUNTER — Ambulatory Visit (INDEPENDENT_AMBULATORY_CARE_PROVIDER_SITE_OTHER): Payer: Medicare Other | Admitting: General Surgery

## 2013-07-27 ENCOUNTER — Encounter (INDEPENDENT_AMBULATORY_CARE_PROVIDER_SITE_OTHER): Payer: Self-pay | Admitting: General Surgery

## 2013-07-27 VITALS — BP 118/80 | HR 80 | Temp 97.6°F | Resp 14 | Ht 69.0 in | Wt 282.8 lb

## 2013-07-27 DIAGNOSIS — R222 Localized swelling, mass and lump, trunk: Secondary | ICD-10-CM

## 2013-07-27 NOTE — Patient Instructions (Signed)
Will get CT chest
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
## 2013-07-27 NOTE — Addendum Note (Signed)
Addended byClarene Essex on: 07/27/2013 01:06 PM   Modules accepted: Orders

## 2013-07-28 ENCOUNTER — Ambulatory Visit (HOSPITAL_COMMUNITY)
Admission: RE | Admit: 2013-07-28 | Discharge: 2013-07-28 | Disposition: A | Discharge status: Home / Self Care | Payer: Medicare Other | Source: Ambulatory Visit | Attending: Gastroenterology | Admitting: Gastroenterology

## 2013-07-28 ENCOUNTER — Encounter (HOSPITAL_COMMUNITY): Payer: Self-pay

## 2013-07-28 ENCOUNTER — Encounter (HOSPITAL_COMMUNITY)
Admission: RE | Disposition: A | Discharge status: Home / Self Care | Payer: Self-pay | Source: Ambulatory Visit | Attending: Gastroenterology

## 2013-07-28 DIAGNOSIS — K648 Other hemorrhoids: Secondary | ICD-10-CM | POA: Insufficient documentation

## 2013-07-28 DIAGNOSIS — K921 Melena: Secondary | ICD-10-CM | POA: Diagnosis not present

## 2013-07-28 DIAGNOSIS — K625 Hemorrhage of anus and rectum: Secondary | ICD-10-CM | POA: Diagnosis not present

## 2013-07-28 DIAGNOSIS — Z8371 Family history of colonic polyps: Secondary | ICD-10-CM | POA: Diagnosis not present

## 2013-07-28 DIAGNOSIS — Z83719 Family history of colon polyps, unspecified: Secondary | ICD-10-CM | POA: Insufficient documentation

## 2013-07-28 HISTORY — PX: COLONOSCOPY: SHX5424

## 2013-07-28 SURGERY — COLONOSCOPY
Anesthesia: Moderate Sedation

## 2013-07-28 MED ORDER — FENTANYL CITRATE 0.05 MG/ML IJ SOLN
INTRAMUSCULAR | Status: AC
  Filled 2013-07-28: qty 2

## 2013-07-28 MED ORDER — FENTANYL CITRATE 0.05 MG/ML IJ SOLN
INTRAMUSCULAR | Status: DC | PRN
  Administered 2013-07-28 (×3): 25 ug via INTRAVENOUS

## 2013-07-28 MED ORDER — MIDAZOLAM HCL 5 MG/5ML IJ SOLN
INTRAMUSCULAR | Status: DC | PRN
  Administered 2013-07-28 (×3): 2 mg via INTRAVENOUS
  Administered 2013-07-28: 1 mg via INTRAVENOUS

## 2013-07-28 MED ORDER — MIDAZOLAM HCL 5 MG/ML IJ SOLN
INTRAMUSCULAR | Status: AC
  Filled 2013-07-28: qty 2

## 2013-07-28 MED ORDER — SODIUM CHLORIDE 0.9 % IV SOLN
INTRAVENOUS | Status: DC
  Administered 2013-07-28: 08:00:00 via INTRAVENOUS

## 2013-07-28 NOTE — Progress Notes (Signed)
[name] 9:21 AM  Subjective: Patient with no further bleeding and no new complaints since we saw him in the office  Objective: Vital signs stable afebrile no acute distress exam please see pre-assessment evaluation  Assessment: Bright red blood per rectum in a patient with a family history of colon polyps  Plan: Okay to proceed with colonoscopy and moderate sedation  [name]

## 2013-07-28 NOTE — Op Note (Signed)
[facility] [place], [address]   COLONOSCOPY PROCEDURE REPORT  PATIENT: [name], [name]  MR#: [id number] BIRTHDATE: [birth year] , 47  yrs. old GENDER: Male ENDOSCOPIST: Clarene Essex, MD REFERRED PV:VZSM Ross, M.D. PROCEDURE DATE:  07/28/2013 PROCEDURE:   Colonoscopy, diagnostic ASA CLASS:   Class II INDICATIONS:Rectal Bleeding and Patient's family history of colon polyps. MEDICATIONS: Fentanyl 70 mcg IV and Versed 7 mg IV  DESCRIPTION OF PROCEDURE:   After the risks benefits and alternatives of the procedure were thoroughly explained, informed consent was obtained.  The [employer] Adult Colon [id number]  endoscope was introduced through the anus and advanced to the terminal ileum which was intubated for a short distance , limited by No adverse events experienced.  no abdominal pressure or position changes was needed. The quality of the prep was adequate. .  The instrument was then slowly withdrawn as the colon was fully examined.the findings are recorded below and the patient tolerated the procedure well there was no obvious immediate complication       FINDINGS: 1 minimal internal hemorrhoids only2 .otherwise within normal limits to the terminal ileum COMPLICATIONS: no  IMPRESSION:  above  RECOMMENDATIONS: treat internal hemorrhoids with HC suppositories as needed call me when necessary follow up when necessary recheck colon screening in 5 years for his family history of polyps   _______________________________ eSigned:  Clarene Essex, MD 07/28/2013 9:56 AM   JQ:GBEE Harrington Challenger, MD

## 2013-07-28 NOTE — Discharge Instructions (Signed)
Call if question or problem otherwise slowly advance diet beginning with soft solids and call me for a prescription of hemorrhoidal suppositories with hydrocortisone if similar bleeding recurs

## 2013-07-29 ENCOUNTER — Encounter (HOSPITAL_COMMUNITY): Payer: Self-pay | Admitting: Gastroenterology

## 2013-07-30 ENCOUNTER — Ambulatory Visit
Admission: RE | Admit: 2013-07-30 | Discharge: 2013-07-30 | Disposition: A | Discharge status: Home / Self Care | Payer: Medicare Other | Source: Ambulatory Visit | Attending: General Surgery | Admitting: General Surgery

## 2013-07-30 DIAGNOSIS — R222 Localized swelling, mass and lump, trunk: Secondary | ICD-10-CM

## 2013-07-30 DIAGNOSIS — R9389 Abnormal findings on diagnostic imaging of other specified body structures: Secondary | ICD-10-CM | POA: Diagnosis not present

## 2013-07-30 MED ORDER — IOHEXOL 300 MG/ML  SOLN
75.0000 mL | Freq: Once | INTRAMUSCULAR | Status: AC | PRN
  Administered 2013-07-30: 75 mL via INTRAVENOUS

## 2013-08-04 ENCOUNTER — Telehealth (INDEPENDENT_AMBULATORY_CARE_PROVIDER_SITE_OTHER): Payer: Self-pay

## 2013-08-04 NOTE — Telephone Encounter (Signed)
Called pt with CT results. He asks how he can get the area removed. Advise he would need to see plastic surgeon to see if they could surgically remove area. Insurance may not cover it. Advised he could call insurance company and ask what his policy has. Pt understanding

## 2013-08-04 NOTE — Telephone Encounter (Signed)
Message copied by Carlene Coria on Tue Aug 04, 2013  4:53 PM ------      Message from: Luella Cook III      Created: Fri Jul 31, 2013  4:23 PM       No lipoma. Follow up as needed ------

## 2013-08-13 DIAGNOSIS — H833X9 Noise effects on inner ear, unspecified ear: Secondary | ICD-10-CM | POA: Diagnosis not present

## 2013-08-17 DIAGNOSIS — M545 Low back pain, unspecified: Secondary | ICD-10-CM | POA: Diagnosis not present

## 2013-08-17 DIAGNOSIS — IMO0002 Reserved for concepts with insufficient information to code with codable children: Secondary | ICD-10-CM | POA: Diagnosis not present

## 2013-08-17 DIAGNOSIS — M5126 Other intervertebral disc displacement, lumbar region: Secondary | ICD-10-CM | POA: Diagnosis not present

## 2013-08-17 DIAGNOSIS — M5137 Other intervertebral disc degeneration, lumbosacral region: Secondary | ICD-10-CM | POA: Diagnosis not present

## 2013-10-01 DIAGNOSIS — K648 Other hemorrhoids: Secondary | ICD-10-CM | POA: Diagnosis not present

## 2013-10-01 DIAGNOSIS — K921 Melena: Secondary | ICD-10-CM | POA: Diagnosis not present

## 2013-10-14 DIAGNOSIS — M25559 Pain in unspecified hip: Secondary | ICD-10-CM | POA: Diagnosis not present

## 2013-11-10 DIAGNOSIS — M25519 Pain in unspecified shoulder: Secondary | ICD-10-CM | POA: Diagnosis not present

## 2013-11-10 DIAGNOSIS — M1610 Unilateral primary osteoarthritis, unspecified hip: Secondary | ICD-10-CM | POA: Diagnosis not present

## 2013-11-13 ENCOUNTER — Encounter: Payer: Self-pay | Admitting: Pulmonary Disease

## 2013-11-13 ENCOUNTER — Ambulatory Visit (INDEPENDENT_AMBULATORY_CARE_PROVIDER_SITE_OTHER): Payer: Medicare Other | Admitting: Pulmonary Disease

## 2013-11-13 ENCOUNTER — Encounter (INDEPENDENT_AMBULATORY_CARE_PROVIDER_SITE_OTHER): Payer: Self-pay

## 2013-11-13 VITALS — BP 138/64 | HR 89 | Ht 69.0 in | Wt 277.8 lb

## 2013-11-13 DIAGNOSIS — R29818 Other symptoms and signs involving the nervous system: Secondary | ICD-10-CM

## 2013-11-13 DIAGNOSIS — R0600 Dyspnea, unspecified: Secondary | ICD-10-CM

## 2013-11-13 DIAGNOSIS — J45998 Other asthma: Secondary | ICD-10-CM

## 2013-11-13 DIAGNOSIS — R0609 Other forms of dyspnea: Secondary | ICD-10-CM | POA: Diagnosis not present

## 2013-11-13 DIAGNOSIS — R0989 Other specified symptoms and signs involving the circulatory and respiratory systems: Secondary | ICD-10-CM | POA: Diagnosis not present

## 2013-11-13 DIAGNOSIS — R29898 Other symptoms and signs involving the musculoskeletal system: Secondary | ICD-10-CM

## 2013-11-13 DIAGNOSIS — J45909 Unspecified asthma, uncomplicated: Secondary | ICD-10-CM | POA: Diagnosis not present

## 2013-11-13 NOTE — Patient Instructions (Signed)
Will arrange for referral to pulmonary rehab  Follow up in 6 months 
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
## 2013-11-13 NOTE — Assessment & Plan Note (Signed)
Will continue dulera, singulair, and prn albuterol.
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
## 2013-11-13 NOTE — Assessment & Plan Note (Signed)
I think he would benefit from monitor exercise program.  Will arrange for referral to pulmonary rehab pending insurance approval.

## 2013-11-27 ENCOUNTER — Telehealth (HOSPITAL_COMMUNITY): Payer: Self-pay

## 2013-11-27 NOTE — Telephone Encounter (Signed)
I have called and left a message with [place] to inquire about participation in Pulmonary Rehab. Will send letter in mail and follow up.

## 2013-11-30 ENCOUNTER — Other Ambulatory Visit (HOSPITAL_COMMUNITY): Payer: Self-pay | Admitting: Specialist

## 2013-11-30 DIAGNOSIS — M25512 Pain in left shoulder: Secondary | ICD-10-CM

## 2013-12-01 ENCOUNTER — Telehealth (HOSPITAL_COMMUNITY): Payer: Self-pay

## 2013-12-01 NOTE — Telephone Encounter (Signed)
I have called and left a message with [place] to inquire about participation in Pulmonary Rehab. Will send letter in mail and follow up.

## 2013-12-07 ENCOUNTER — Telehealth (HOSPITAL_COMMUNITY): Payer: Self-pay

## 2013-12-07 NOTE — Telephone Encounter (Signed)
I have called and left a message with [facility] to inquire about participation in Pulmonary Rehab. Will send letter in mail and follow up.

## 2013-12-10 ENCOUNTER — Telehealth (HOSPITAL_COMMUNITY): Payer: Self-pay

## 2013-12-10 NOTE — Telephone Encounter (Signed)
Patient returned phone call regarding entrance into Pulmonary Rehab.  Patient states that he needs to have back surgery and that he needs to put rehab on hold for now.

## 2013-12-29 ENCOUNTER — Encounter (HOSPITAL_COMMUNITY): Payer: Self-pay | Admitting: Pharmacy Technician

## 2014-01-01 ENCOUNTER — Encounter (HOSPITAL_COMMUNITY): Payer: Self-pay

## 2014-01-01 ENCOUNTER — Encounter (HOSPITAL_COMMUNITY)
Admission: RE | Admit: 2014-01-01 | Discharge: 2014-01-01 | Disposition: A | Discharge status: Home / Self Care | Payer: Medicare Other | Source: Ambulatory Visit | Attending: Specialist | Admitting: Specialist

## 2014-01-01 DIAGNOSIS — T300 Burn of unspecified body region, unspecified degree: Secondary | ICD-10-CM | POA: Diagnosis not present

## 2014-01-01 DIAGNOSIS — F4310 Post-traumatic stress disorder, unspecified: Secondary | ICD-10-CM | POA: Insufficient documentation

## 2014-01-01 DIAGNOSIS — J45909 Unspecified asthma, uncomplicated: Secondary | ICD-10-CM | POA: Insufficient documentation

## 2014-01-01 DIAGNOSIS — Z01818 Encounter for other preprocedural examination: Secondary | ICD-10-CM | POA: Diagnosis not present

## 2014-01-01 DIAGNOSIS — J705 Respiratory conditions due to smoke inhalation: Secondary | ICD-10-CM | POA: Insufficient documentation

## 2014-01-01 DIAGNOSIS — Z6841 Body Mass Index (BMI) 40.0 and over, adult: Secondary | ICD-10-CM | POA: Diagnosis not present

## 2014-01-01 LAB — BASIC METABOLIC PANEL
Anion gap: 12 (ref 5–15)
BUN: 13 mg/dL (ref 6–23)
CO2: 24 mEq/L (ref 19–32)
Calcium: 8.9 mg/dL (ref 8.4–10.5)
Chloride: 104 mEq/L (ref 96–112)
Creatinine, Ser: 0.96 mg/dL (ref 0.50–1.35)
GFR calc Af Amer: >90 mL/min (ref >90)
GFR calc non Af Amer: >90 mL/min (ref >90)
Glucose, Bld: 87 mg/dL (ref 70–99)
Potassium: 4.2 mEq/L (ref 3.7–5.3)
Sodium: 140 mEq/L (ref 137–147)

## 2014-01-01 LAB — CBC
HCT: 43.4 % (ref 39.0–52.0)
Hemoglobin: 14.8 g/dL (ref 13.0–17.0)
MCH: 28.6 pg (ref 26.0–34.0)
MCHC: 34.1 g/dL (ref 30.0–36.0)
MCV: 83.9 fL (ref 78.0–100.0)
Platelets: 138 10*3/uL — ABNORMAL LOW (ref 150–400)
RBC: 5.17 MIL/uL (ref 4.22–5.81)
RDW: 14.3 % (ref 11.5–15.5)
WBC: 3.6 10*3/uL — ABNORMAL LOW (ref 4.0–10.5)

## 2014-01-01 NOTE — Progress Notes (Signed)
PCP is Warden/ranger if Dr Johnsie Cancel, he saw him last 06-10-13 Stress test noted in epic from 2013 Echo noted in epic from 2013 Denies a recent CXR or EKG, both noted in epic from 01-13-13.

## 2014-01-01 NOTE — Pre-Procedure Instructions (Signed)
[name]  01/01/2014   Your procedure is scheduled on: Oct 29 at 0800  Report to [facility] Admitting at 0600 AM.  Call this number if you have problems the morning of surgery: [phone]   Remember:   Do not eat food or drink liquids after midnight.   Take these medicines the morning of surgery with A SIP OF WATER: Albuterol (Proventil) inhaler -bring with you the morning of procedure,Singular if needed, Pain med if needed  Stop taking Aspirin, Ibuprofen, herbal medications, BC's, Goody's, Fish Oil   Do not wear jewelry, make-up or nail polish.  Do not wear lotions, powders, or perfumes. You may wear deodorant.  Do not shave 48 hours prior to surgery. Men may shave face and neck.  Do not bring valuables to the hospital.  [facility] is not responsible                  for any belongings or valuables.               Contacts, dentures or bridgework may not be worn into surgery.  Leave suitcase in the car. After surgery it may be brought to your room.  For patients admitted to the hospital, discharge time is determined by your                treatment team.               Patients discharged the day of surgery will not be allowed to drive  home.    Special Instructions:    Please read over the following fact sheets that you were given: Pain Booklet and Coughing and Deep Breathing
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
## 2014-01-05 ENCOUNTER — Ambulatory Visit: Payer: Self-pay | Admitting: Orthopedic Surgery
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
## 2014-01-07 ENCOUNTER — Encounter (HOSPITAL_COMMUNITY)
Admission: RE | Disposition: A | Discharge status: Home / Self Care | Payer: Self-pay | Source: Ambulatory Visit | Attending: Specialist

## 2014-01-07 ENCOUNTER — Encounter (HOSPITAL_COMMUNITY): Payer: Medicare Other | Admitting: Emergency Medicine

## 2014-01-07 ENCOUNTER — Encounter (HOSPITAL_COMMUNITY): Payer: Self-pay | Admitting: *Deleted

## 2014-01-07 ENCOUNTER — Ambulatory Visit (HOSPITAL_COMMUNITY): Payer: Medicare Other | Admitting: Certified Registered Nurse Anesthetist

## 2014-01-07 ENCOUNTER — Ambulatory Visit (HOSPITAL_COMMUNITY)
Admission: RE | Admit: 2014-01-07 | Discharge: 2014-01-07 | Disposition: A | Discharge status: Home / Self Care | Payer: Medicare Other | Source: Ambulatory Visit | Attending: Specialist | Admitting: Specialist

## 2014-01-07 DIAGNOSIS — R0602 Shortness of breath: Secondary | ICD-10-CM | POA: Diagnosis not present

## 2014-01-07 DIAGNOSIS — M25512 Pain in left shoulder: Secondary | ICD-10-CM | POA: Diagnosis not present

## 2014-01-07 DIAGNOSIS — M7542 Impingement syndrome of left shoulder: Secondary | ICD-10-CM | POA: Diagnosis not present

## 2014-01-07 DIAGNOSIS — M7582 Other shoulder lesions, left shoulder: Secondary | ICD-10-CM | POA: Diagnosis not present

## 2014-01-07 HISTORY — PX: RADIOLOGY WITH ANESTHESIA: SHX6223

## 2014-01-07 SURGERY — RADIOLOGY WITH ANESTHESIA
Anesthesia: General | Laterality: Left

## 2014-01-07 MED ORDER — OXYCODONE HCL 5 MG PO TABS: 5.0000 mg | ORAL_TABLET | Freq: Once | ORAL | Status: DC | PRN

## 2014-01-07 MED ORDER — OXYCODONE HCL 5 MG/5ML PO SOLN: 5.0000 mg | Freq: Once | ORAL | Status: DC | PRN

## 2014-01-07 MED ORDER — PROMETHAZINE HCL 25 MG/ML IJ SOLN: 6.2500 mg | INTRAMUSCULAR | Status: DC | PRN

## 2014-01-07 MED ORDER — HYDROMORPHONE HCL 1 MG/ML IJ SOLN: 0.2500 mg | INTRAMUSCULAR | Status: DC | PRN

## 2014-01-07 NOTE — Transfer of Care (Signed)
Immediate Anesthesia Transfer of Care Note  Patient: [name]  Procedure(s) Performed: Procedure(s): RADIOLOGY WITH ANESTHESIA MRI LEFT SHOULDER W/O CONTRAST WITH ANES (Left)  Patient Location: PACU  Anesthesia Type:General  Level of Consciousness: awake, alert  and oriented  Airway & Oxygen Therapy: Patient Spontanous Breathing and Patient connected to nasal cannula oxygen  Post-op Assessment: Report given to PACU RN, Post -op Vital signs reviewed and stable and Patient moving all extremities X 4  Post vital signs: Reviewed and stable  Complications: No apparent anesthesia complications

## 2014-01-07 NOTE — Anesthesia Postprocedure Evaluation (Signed)
Anesthesia Post Note  Patient: [name]  Procedure(s) Performed: Procedure(s) (LRB): RADIOLOGY WITH ANESTHESIA MRI LEFT SHOULDER W/O CONTRAST WITH ANES (Left)  Anesthesia type: general  Patient location: PACU  Post pain: Pain level controlled  Post assessment: Patient's Cardiovascular Status Stable  Last Vitals:  Filed Vitals:   01/07/14 1035  BP: 108/61  Pulse: 82  Temp:   Resp: 15    Post vital signs: Reviewed and stable  Level of consciousness: sedated  Complications: No apparent anesthesia complications

## 2014-01-07 NOTE — Anesthesia Preprocedure Evaluation (Addendum)
Anesthesia Evaluation  Patient identified by MRN, date of birth, ID band Patient awake    Reviewed: Allergy & Precautions, H&P , NPO status , Patient's Chart, lab work & pertinent test results  History of Anesthesia Complications (+) PROLONGED EMERGENCE and history of anesthetic complications  Airway Mallampati: II  TM Distance: >3 FB Neck ROM: Full    Dental  (+) Teeth Intact, Dental Advisory Given   Pulmonary asthma ,    Pulmonary exam normal       Cardiovascular + dysrhythmias     Neuro/Psych PSYCHIATRIC DISORDERS Anxiety Depression negative neurological ROS     GI/Hepatic negative GI ROS, Neg liver ROS,   Endo/Other  Morbid obesity  Renal/GU negative Renal ROS     Musculoskeletal   Abdominal   Peds  Hematology   Anesthesia Other Findings   Reproductive/Obstetrics                            Anesthesia Physical Anesthesia Plan  ASA: II  Anesthesia Plan: General   Post-op Pain Management:    Induction: Intravenous  Airway Management Planned: Oral ETT  Additional Equipment:   Intra-op Plan:   Post-operative Plan: Extubation in OR  Informed Consent:   Plan Discussed with:   Anesthesia Plan Comments:        Anesthesia Quick Evaluation

## 2014-01-08 ENCOUNTER — Encounter (HOSPITAL_COMMUNITY): Payer: Self-pay | Admitting: Radiology

## 2014-01-14 DIAGNOSIS — M25512 Pain in left shoulder: Secondary | ICD-10-CM | POA: Diagnosis not present

## 2014-01-21 DIAGNOSIS — M25512 Pain in left shoulder: Secondary | ICD-10-CM | POA: Diagnosis not present

## 2014-01-27 DIAGNOSIS — M25512 Pain in left shoulder: Secondary | ICD-10-CM | POA: Diagnosis not present
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
## 2014-01-29 DIAGNOSIS — M25512 Pain in left shoulder: Secondary | ICD-10-CM | POA: Diagnosis not present

## 2014-02-02 DIAGNOSIS — M25512 Pain in left shoulder: Secondary | ICD-10-CM | POA: Diagnosis not present

## 2014-02-10 ENCOUNTER — Ambulatory Visit
Admission: RE | Admit: 2014-02-10 | Discharge: 2014-02-10 | Disposition: A | Discharge status: Home / Self Care | Payer: Medicare Other | Source: Ambulatory Visit | Attending: Family Medicine | Admitting: Family Medicine

## 2014-02-10 ENCOUNTER — Other Ambulatory Visit: Payer: Self-pay | Admitting: Family Medicine

## 2014-02-10 DIAGNOSIS — S299XXA Unspecified injury of thorax, initial encounter: Secondary | ICD-10-CM | POA: Diagnosis not present

## 2014-02-10 DIAGNOSIS — M549 Dorsalgia, unspecified: Secondary | ICD-10-CM

## 2014-02-10 DIAGNOSIS — M25552 Pain in left hip: Secondary | ICD-10-CM

## 2014-02-10 DIAGNOSIS — S79912A Unspecified injury of left hip, initial encounter: Secondary | ICD-10-CM | POA: Diagnosis not present

## 2014-02-10 DIAGNOSIS — M25512 Pain in left shoulder: Secondary | ICD-10-CM | POA: Diagnosis not present

## 2014-02-10 DIAGNOSIS — M546 Pain in thoracic spine: Secondary | ICD-10-CM | POA: Diagnosis not present

## 2014-02-11 DIAGNOSIS — M25512 Pain in left shoulder: Secondary | ICD-10-CM | POA: Diagnosis not present

## 2014-02-15 DIAGNOSIS — M25512 Pain in left shoulder: Secondary | ICD-10-CM | POA: Diagnosis not present

## 2014-02-15 DIAGNOSIS — M549 Dorsalgia, unspecified: Secondary | ICD-10-CM | POA: Diagnosis not present

## 2014-02-19 DIAGNOSIS — M545 Low back pain: Secondary | ICD-10-CM | POA: Diagnosis not present

## 2014-02-19 DIAGNOSIS — M25552 Pain in left hip: Secondary | ICD-10-CM | POA: Diagnosis not present

## 2014-02-19 DIAGNOSIS — M546 Pain in thoracic spine: Secondary | ICD-10-CM | POA: Diagnosis not present

## 2014-03-04 ENCOUNTER — Other Ambulatory Visit (HOSPITAL_COMMUNITY): Payer: Self-pay | Admitting: Specialist

## 2014-03-04 DIAGNOSIS — M5440 Lumbago with sciatica, unspecified side: Secondary | ICD-10-CM

## 2014-03-04 DIAGNOSIS — M546 Pain in thoracic spine: Secondary | ICD-10-CM

## 2014-03-08 DIAGNOSIS — K625 Hemorrhage of anus and rectum: Secondary | ICD-10-CM | POA: Diagnosis not present

## 2014-03-08 DIAGNOSIS — K640 First degree hemorrhoids: Secondary | ICD-10-CM | POA: Diagnosis not present

## 2014-03-29 ENCOUNTER — Other Ambulatory Visit (HOSPITAL_COMMUNITY): Payer: Medicare Other

## 2014-03-29 ENCOUNTER — Encounter (HOSPITAL_COMMUNITY): Payer: Self-pay | Admitting: Pharmacy Technician

## 2014-03-30 ENCOUNTER — Other Ambulatory Visit (HOSPITAL_COMMUNITY): Payer: Medicare Other

## 2014-03-31 ENCOUNTER — Encounter (HOSPITAL_COMMUNITY)
Admission: RE | Admit: 2014-03-31 | Discharge: 2014-03-31 | Disposition: A | Discharge status: Home / Self Care | Payer: Medicare Other | Source: Ambulatory Visit | Attending: Specialist | Admitting: Specialist

## 2014-03-31 ENCOUNTER — Encounter (HOSPITAL_COMMUNITY): Payer: Self-pay

## 2014-03-31 DIAGNOSIS — M546 Pain in thoracic spine: Secondary | ICD-10-CM | POA: Insufficient documentation

## 2014-03-31 DIAGNOSIS — Z01818 Encounter for other preprocedural examination: Secondary | ICD-10-CM | POA: Insufficient documentation

## 2014-03-31 DIAGNOSIS — M545 Low back pain: Secondary | ICD-10-CM | POA: Diagnosis not present

## 2014-03-31 HISTORY — DX: Presence of spectacles and contact lenses: Z97.3

## 2014-03-31 HISTORY — DX: Claustrophobia: F40.240

## 2014-03-31 LAB — CBC
HCT: 42.9 % (ref 39.0–52.0)
Hemoglobin: 15 g/dL (ref 13.0–17.0)
MCH: 28.8 pg (ref 26.0–34.0)
MCHC: 35 g/dL (ref 30.0–36.0)
MCV: 82.5 fL (ref 78.0–100.0)
Platelets: 159 10*3/uL (ref 150–400)
RBC: 5.2 MIL/uL (ref 4.22–5.81)
RDW: 13.3 % (ref 11.5–15.5)
WBC: 4.1 10*3/uL (ref 4.0–10.5)

## 2014-03-31 LAB — BASIC METABOLIC PANEL
Anion gap: 7 (ref 5–15)
BUN: 12 mg/dL (ref 6–23)
CO2: 23 mmol/L (ref 19–32)
Calcium: 8.9 mg/dL (ref 8.4–10.5)
Chloride: 108 mEq/L (ref 96–112)
Creatinine, Ser: 1.06 mg/dL (ref 0.50–1.35)
GFR calc Af Amer: >90 mL/min (ref >90)
GFR calc non Af Amer: 82 mL/min — ABNORMAL LOW (ref >90)
Glucose, Bld: 72 mg/dL (ref 70–99)
Potassium: 4 mmol/L (ref 3.5–5.1)
Sodium: 138 mmol/L (ref 135–145)

## 2014-03-31 NOTE — Pre-Procedure Instructions (Signed)
[name]  03/31/2014   Your procedure is scheduled on: Tuesday, April 06, 2014  Report to [facility] Admitting at 6:00 AM.  Call this number if you have problems the morning of surgery: [phone]   Remember:   Do not eat food or drink liquids after midnight Monday, April 05, 2014   Take these medicines the morning of surgery with A SIP OF WATER: if needed:Oxycodone for pain,  mometasone-formoterol (DULERA) for wheezing, albuterol (PROVENTIL HFA;VENTOLIN HFA) inhaler for wheezing or shortness of breath (bring inhalers on day of procedure).  Stop taking Aspirin, vitamins, and herbal medications. Do not take any NSAIDs ie: Ibuprofen, Motrin, Advil, Naproxen ( Anaprox) or any medication containing Aspirin; stop now   Do not wear jewelry, make-up or nail polish.  Do not wear lotions, powders, or perfumes. You may not wear deodorant.  Do not shave 48 hours prior to surgery. Men may shave face and neck.  Do not bring valuables to the hospital.  [facility] is not responsible for any belongings or valuables.               Contacts, dentures or bridgework may not be worn into surgery.  Leave suitcase in the car. After surgery it may be brought to your room.  For patients admitted to the hospital, discharge time is determined by your treatment team.               Patients discharged the day of surgery will not be allowed to drive home.  Name and phone number of your driver:   Special Instructions:  Special Instructions:Special Instructions: [facility] - Preparing for Surgery  Before surgery, you can play an important role.  Because skin is not sterile, your skin needs to be as free of germs as possible.  You can reduce the number of germs on you skin by washing with CHG (chlorahexidine gluconate) soap before surgery.  CHG is an antiseptic cleaner which kills germs and bonds with the skin to continue killing germs even after washing.  Please DO NOT use if you have an  allergy to CHG or antibacterial soaps.  If your skin becomes reddened/irritated stop using the CHG and inform your nurse when you arrive at Short Stay.  Do not shave (including legs and underarms) for at least 48 hours prior to the first CHG shower.  You may shave your face.  Please follow these instructions carefully:   1.  Shower with CHG Soap the night before surgery and the morning of Surgery.  2.  If you choose to wash your hair, wash your hair first as usual with your normal shampoo.  3.  After you shampoo, rinse your hair and body thoroughly to remove the Shampoo.  4.  Use CHG as you would any other liquid soap.  You can apply chg directly  to the skin and wash gently with scrungie or a clean washcloth.  5.  Apply the CHG Soap to your body ONLY FROM THE NECK DOWN.  Do not use on open wounds or open sores.  Avoid contact with your eyes, ears, mouth and genitals (private parts).  Wash genitals (private parts) with your normal soap.  6.  Wash thoroughly, paying special attention to the area where your surgery will be performed.  7.  Thoroughly rinse your body with warm water from the neck down.  8.  DO NOT shower/wash with your normal soap after using and rinsing off the CHG Soap.  9.  Pat yourself dry with a clean towel.            10.  Wear clean pajamas.            11.  Place clean sheets on your bed the night of your first shower and do not sleep with pets.  Day of Surgery  Do not apply any lotions/deodorants the morning of surgery.  Please wear clean clothes to the hospital/surgery center.   Please read over the following fact sheets that you were given: Pain Booklet and Coughing and Deep Breathing

## 2014-03-31 NOTE — Progress Notes (Signed)
Pt denies SOB but stated that he experienced some chest pain "yesterday" and is under the care of Dr. Audelia Acton, cardiologist. Pt denies having a cardiac cath. Consulted with Ebony Hail, [place] ( anesthesia) regarding pt c/o chest pain; an EKG was done and pt was assessed by Ebony Hail, PA.
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
## 2014-04-05 NOTE — Anesthesia Preprocedure Evaluation (Addendum)
Anesthesia Evaluation  Patient identified by MRN, date of birth, ID band Patient awake    Reviewed: Allergy & Precautions, NPO status , Patient's Chart, lab work & pertinent test results  History of Anesthesia Complications History of anesthetic complications: slow to wake, soar throat.  Airway Mallampati: II       Dental  (+) Teeth Intact, Dental Advisory Given   Pulmonary asthma ,  Obstructive and restrictive disease PFT about 50% predicted breath sounds clear to auscultation        Cardiovascular Rhythm:Regular  ECHO 2014 EF 65%' STRESS 2013 WNL   Neuro/Psych Anxiety Depression    GI/Hepatic negative GI ROS, Neg liver ROS,   Endo/Other  negative endocrine ROS  Renal/GU negative Renal ROS     Musculoskeletal   Abdominal (+) + obese,   Peds  Hematology 15/42   Anesthesia Other Findings   Reproductive/Obstetrics                            Anesthesia Physical Anesthesia Plan  ASA: III  Anesthesia Plan: General   Post-op Pain Management:    Induction: Intravenous  Airway Management Planned: Oral ETT  Additional Equipment:   Intra-op Plan:   Post-operative Plan: Extubation in OR  Informed Consent: I have reviewed the patients History and Physical, chart, labs and discussed the procedure including the risks, benefits and alternatives for the proposed anesthesia with the patient or authorized representative who has indicated his/her understanding and acceptance.     Plan Discussed with:   Anesthesia Plan Comments:         Anesthesia Quick Evaluation

## 2014-04-06 ENCOUNTER — Encounter (HOSPITAL_COMMUNITY): Payer: Self-pay | Admitting: *Deleted

## 2014-04-06 ENCOUNTER — Ambulatory Visit (HOSPITAL_COMMUNITY)
Admission: RE | Admit: 2014-04-06 | Discharge: 2014-04-06 | Disposition: A | Discharge status: Home / Self Care | Payer: Medicare Other | Source: Ambulatory Visit | Attending: Specialist | Admitting: Specialist

## 2014-04-06 ENCOUNTER — Other Ambulatory Visit: Payer: Self-pay

## 2014-04-06 ENCOUNTER — Ambulatory Visit (HOSPITAL_COMMUNITY): Payer: Medicare Other | Admitting: Anesthesiology

## 2014-04-06 ENCOUNTER — Ambulatory Visit (HOSPITAL_COMMUNITY): Payer: Medicare Other | Admitting: Vascular Surgery

## 2014-04-06 ENCOUNTER — Encounter (HOSPITAL_COMMUNITY)
Admission: RE | Disposition: A | Discharge status: Home / Self Care | Payer: Self-pay | Source: Ambulatory Visit | Attending: Specialist

## 2014-04-06 DIAGNOSIS — M546 Pain in thoracic spine: Secondary | ICD-10-CM

## 2014-04-06 DIAGNOSIS — J45909 Unspecified asthma, uncomplicated: Secondary | ICD-10-CM | POA: Insufficient documentation

## 2014-04-06 DIAGNOSIS — M5134 Other intervertebral disc degeneration, thoracic region: Secondary | ICD-10-CM | POA: Diagnosis not present

## 2014-04-06 DIAGNOSIS — F329 Major depressive disorder, single episode, unspecified: Secondary | ICD-10-CM | POA: Diagnosis not present

## 2014-04-06 DIAGNOSIS — Z6841 Body Mass Index (BMI) 40.0 and over, adult: Secondary | ICD-10-CM | POA: Diagnosis not present

## 2014-04-06 DIAGNOSIS — M79602 Pain in left arm: Secondary | ICD-10-CM | POA: Insufficient documentation

## 2014-04-06 DIAGNOSIS — M5136 Other intervertebral disc degeneration, lumbar region: Secondary | ICD-10-CM | POA: Diagnosis not present

## 2014-04-06 DIAGNOSIS — F419 Anxiety disorder, unspecified: Secondary | ICD-10-CM | POA: Insufficient documentation

## 2014-04-06 DIAGNOSIS — E669 Obesity, unspecified: Secondary | ICD-10-CM | POA: Diagnosis not present

## 2014-04-06 DIAGNOSIS — M5124 Other intervertebral disc displacement, thoracic region: Secondary | ICD-10-CM | POA: Diagnosis not present

## 2014-04-06 DIAGNOSIS — M5440 Lumbago with sciatica, unspecified side: Secondary | ICD-10-CM

## 2014-04-06 DIAGNOSIS — M545 Low back pain: Secondary | ICD-10-CM | POA: Diagnosis not present

## 2014-04-06 HISTORY — PX: RADIOLOGY WITH ANESTHESIA: SHX6223

## 2014-04-06 SURGERY — RADIOLOGY WITH ANESTHESIA
Anesthesia: General

## 2014-04-06 MED ORDER — LACTATED RINGERS IV SOLN
INTRAVENOUS | Status: DC
  Administered 2014-04-06: 07:00:00 via INTRAVENOUS

## 2014-04-06 MED ORDER — LIDOCAINE HCL (CARDIAC) 20 MG/ML IV SOLN
INTRAVENOUS | Status: DC | PRN
  Administered 2014-04-06: 100 mg via INTRAVENOUS

## 2014-04-06 MED ORDER — GADOBENATE DIMEGLUMINE 529 MG/ML IV SOLN
20.0000 mL | Freq: Once | INTRAVENOUS | Status: AC
  Administered 2014-04-06: 20 mL via INTRAVENOUS

## 2014-04-06 MED ORDER — ALBUTEROL SULFATE (2.5 MG/3ML) 0.083% IN NEBU
2.5000 mg | INHALATION_SOLUTION | Freq: Four times a day (QID) | RESPIRATORY_TRACT | Status: DC | PRN
  Administered 2014-04-06: 2.5 mg via RESPIRATORY_TRACT

## 2014-04-06 MED ORDER — FENTANYL CITRATE 0.05 MG/ML IJ SOLN
25.0000 ug | INTRAMUSCULAR | Status: DC | PRN
  Administered 2014-04-06 (×4): 25 ug via INTRAVENOUS

## 2014-04-06 MED ORDER — FENTANYL CITRATE 0.05 MG/ML IJ SOLN
INTRAMUSCULAR | Status: AC
  Administered 2014-04-06: 25 ug via INTRAVENOUS
  Filled 2014-04-06: qty 2

## 2014-04-06 MED ORDER — PROPOFOL 10 MG/ML IV BOLUS
INTRAVENOUS | Status: DC | PRN
  Administered 2014-04-06: 200 mg via INTRAVENOUS

## 2014-04-06 MED ORDER — MIDAZOLAM HCL 5 MG/5ML IJ SOLN
INTRAMUSCULAR | Status: DC | PRN
  Administered 2014-04-06 (×2): 1 mg via INTRAVENOUS

## 2014-04-06 MED ORDER — FENTANYL CITRATE 0.05 MG/ML IJ SOLN
INTRAMUSCULAR | Status: DC | PRN
  Administered 2014-04-06 (×2): 50 ug via INTRAVENOUS

## 2014-04-06 MED ORDER — MEPERIDINE HCL 25 MG/ML IJ SOLN: 6.2500 mg | INTRAMUSCULAR | Status: DC | PRN

## 2014-04-06 MED ORDER — PROMETHAZINE HCL 25 MG/ML IJ SOLN: 6.2500 mg | INTRAMUSCULAR | Status: DC | PRN

## 2014-04-06 MED ORDER — ALBUTEROL SULFATE (2.5 MG/3ML) 0.083% IN NEBU
INHALATION_SOLUTION | RESPIRATORY_TRACT | Status: AC
  Filled 2014-04-06: qty 3

## 2014-04-06 MED ORDER — SUCCINYLCHOLINE CHLORIDE 20 MG/ML IJ SOLN
INTRAMUSCULAR | Status: DC | PRN
  Administered 2014-04-06: 140 mg via INTRAVENOUS

## 2014-04-06 NOTE — Progress Notes (Signed)
Patient complaining of new onset chest and Left shoulder pain following general anesthesia for MRI of spine.  Pt. Has history of claustrophobia and needed GA.  Had reported normal Stress Test and ECHO in 2013.  12 Lead EKG now slightly different from pre-op.  Patient also has hx of Left shoulder problems,  but  He says this is different.  No ectopy and saturations good.  Will get cardiology input for completeness.

## 2014-04-06 NOTE — Transfer of Care (Signed)
Immediate Anesthesia Transfer of Care Note  Patient: [name]  Procedure(s) Performed: Procedure(s): MRI OF LUMBAR SPINE AND THORACIC SPINE   (RADIOLOGY WITH ANESTHESIA)  (N/A)  Patient Location: PACU  Anesthesia Type:General  Level of Consciousness: awake, alert  and oriented  Airway & Oxygen Therapy: Patient Spontanous Breathing and Patient connected to nasal cannula oxygen  Post-op Assessment: Report given to PACU RN and Post -op Vital signs reviewed and stable  Post vital signs: Reviewed and stable  Complications: No apparent anesthesia complications

## 2014-04-06 NOTE — Discharge Instructions (Signed)

## 2014-04-06 NOTE — Progress Notes (Signed)
Have repeated EKG and cardiology has seen EKG.  No significant change noted.  Pain is better now with low dose fentanyl.  Will give him a light lunch and observe

## 2014-04-06 NOTE — Anesthesia Postprocedure Evaluation (Signed)
  Anesthesia Post-op Note  Patient: [name]  Procedure(s) Performed: Procedure(s): MRI OF LUMBAR SPINE AND THORACIC SPINE   (RADIOLOGY WITH ANESTHESIA)  (N/A)  Patient Location: PACU  Anesthesia Type:General  Level of Consciousness: awake  Airway and Oxygen Therapy: Patient Spontanous Breathing and Patient connected to nasal cannula oxygen  Post-op Pain: none  Post-op Assessment: Post-op Vital signs reviewed, Patient's Cardiovascular Status Stable, Patent Airway and No signs of Nausea or vomiting  Post-op Vital Signs: Reviewed and stable  Last Vitals:  Filed Vitals:   04/06/14 1050  BP: 120/76  Pulse: 93  Temp: 36.7 C  Resp: 23    Complications: No apparent anesthesia complications

## 2014-04-06 NOTE — Progress Notes (Signed)
Pain less now.  Does have some numbness in fingers.  Suspect musculoskeletal in origin.  Eating lunch.  Feeling better now, will continue to observe and DC if all remains well.  Thanks for help from cardiology.

## 2014-04-08 ENCOUNTER — Encounter (HOSPITAL_COMMUNITY): Payer: Self-pay | Admitting: Radiology

## 2014-04-13 DIAGNOSIS — M546 Pain in thoracic spine: Secondary | ICD-10-CM | POA: Diagnosis not present

## 2014-04-13 DIAGNOSIS — M545 Low back pain: Secondary | ICD-10-CM | POA: Diagnosis not present

## 2014-04-15 DIAGNOSIS — M549 Dorsalgia, unspecified: Secondary | ICD-10-CM | POA: Diagnosis not present
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
## 2014-05-05 ENCOUNTER — Encounter: Payer: Self-pay | Admitting: Cardiovascular Disease

## 2014-05-05 ENCOUNTER — Ambulatory Visit (INDEPENDENT_AMBULATORY_CARE_PROVIDER_SITE_OTHER): Payer: Medicare Other | Admitting: Cardiovascular Disease

## 2014-05-05 VITALS — BP 110/72 | HR 91 | Ht 69.0 in | Wt 285.4 lb

## 2014-05-05 DIAGNOSIS — J45998 Other asthma: Secondary | ICD-10-CM

## 2014-05-05 DIAGNOSIS — R072 Precordial pain: Secondary | ICD-10-CM

## 2014-05-05 NOTE — Patient Instructions (Signed)
Your physician recommends that you schedule a follow-up appointment in:  3 MONTHS WITH  DR NISHAN  Your physician recommends that you continue on your current medications as directed. Please refer to the Current Medication list given to you today.  

## 2014-05-05 NOTE — Assessment & Plan Note (Signed)
No active wheezing stable

## 2014-05-05 NOTE — Assessment & Plan Note (Signed)
Atypical normal cardiopulmonary stress test and myovue observe

## 2014-05-13 DIAGNOSIS — M47813 Spondylosis without myelopathy or radiculopathy, cervicothoracic region: Secondary | ICD-10-CM | POA: Diagnosis not present

## 2014-05-13 DIAGNOSIS — M47817 Spondylosis without myelopathy or radiculopathy, lumbosacral region: Secondary | ICD-10-CM | POA: Diagnosis not present

## 2014-05-13 DIAGNOSIS — M9902 Segmental and somatic dysfunction of thoracic region: Secondary | ICD-10-CM | POA: Diagnosis not present

## 2014-05-13 DIAGNOSIS — M9904 Segmental and somatic dysfunction of sacral region: Secondary | ICD-10-CM | POA: Diagnosis not present

## 2014-05-13 DIAGNOSIS — M461 Sacroiliitis, not elsewhere classified: Secondary | ICD-10-CM | POA: Diagnosis not present

## 2014-05-13 DIAGNOSIS — M9903 Segmental and somatic dysfunction of lumbar region: Secondary | ICD-10-CM | POA: Diagnosis not present

## 2014-06-01 DIAGNOSIS — M5134 Other intervertebral disc degeneration, thoracic region: Secondary | ICD-10-CM | POA: Insufficient documentation

## 2014-06-01 DIAGNOSIS — M5136 Other intervertebral disc degeneration, lumbar region: Secondary | ICD-10-CM | POA: Diagnosis not present

## 2014-06-01 DIAGNOSIS — M545 Low back pain: Secondary | ICD-10-CM | POA: Diagnosis not present

## 2014-06-01 DIAGNOSIS — M546 Pain in thoracic spine: Secondary | ICD-10-CM | POA: Diagnosis not present

## 2014-06-02 DIAGNOSIS — M47817 Spondylosis without myelopathy or radiculopathy, lumbosacral region: Secondary | ICD-10-CM | POA: Diagnosis not present

## 2014-06-02 DIAGNOSIS — M9903 Segmental and somatic dysfunction of lumbar region: Secondary | ICD-10-CM | POA: Diagnosis not present

## 2014-06-02 DIAGNOSIS — M9902 Segmental and somatic dysfunction of thoracic region: Secondary | ICD-10-CM | POA: Diagnosis not present

## 2014-06-02 DIAGNOSIS — M461 Sacroiliitis, not elsewhere classified: Secondary | ICD-10-CM | POA: Diagnosis not present

## 2014-06-02 DIAGNOSIS — M47813 Spondylosis without myelopathy or radiculopathy, cervicothoracic region: Secondary | ICD-10-CM | POA: Diagnosis not present

## 2014-06-02 DIAGNOSIS — M9904 Segmental and somatic dysfunction of sacral region: Secondary | ICD-10-CM | POA: Diagnosis not present

## 2014-06-07 DIAGNOSIS — M9902 Segmental and somatic dysfunction of thoracic region: Secondary | ICD-10-CM | POA: Diagnosis not present

## 2014-06-07 DIAGNOSIS — M47813 Spondylosis without myelopathy or radiculopathy, cervicothoracic region: Secondary | ICD-10-CM | POA: Diagnosis not present

## 2014-06-07 DIAGNOSIS — M9904 Segmental and somatic dysfunction of sacral region: Secondary | ICD-10-CM | POA: Diagnosis not present

## 2014-06-07 DIAGNOSIS — M9903 Segmental and somatic dysfunction of lumbar region: Secondary | ICD-10-CM | POA: Diagnosis not present

## 2014-06-07 DIAGNOSIS — M461 Sacroiliitis, not elsewhere classified: Secondary | ICD-10-CM | POA: Diagnosis not present

## 2014-06-07 DIAGNOSIS — M47817 Spondylosis without myelopathy or radiculopathy, lumbosacral region: Secondary | ICD-10-CM | POA: Diagnosis not present

## 2014-06-14 ENCOUNTER — Ambulatory Visit: Payer: Medicare Other | Admitting: Cardiovascular Disease

## 2014-06-16 DIAGNOSIS — M47813 Spondylosis without myelopathy or radiculopathy, cervicothoracic region: Secondary | ICD-10-CM | POA: Diagnosis not present

## 2014-06-16 DIAGNOSIS — M461 Sacroiliitis, not elsewhere classified: Secondary | ICD-10-CM | POA: Diagnosis not present

## 2014-06-16 DIAGNOSIS — M9903 Segmental and somatic dysfunction of lumbar region: Secondary | ICD-10-CM | POA: Diagnosis not present

## 2014-06-16 DIAGNOSIS — M9904 Segmental and somatic dysfunction of sacral region: Secondary | ICD-10-CM | POA: Diagnosis not present

## 2014-06-16 DIAGNOSIS — M47817 Spondylosis without myelopathy or radiculopathy, lumbosacral region: Secondary | ICD-10-CM | POA: Diagnosis not present

## 2014-06-16 DIAGNOSIS — M9902 Segmental and somatic dysfunction of thoracic region: Secondary | ICD-10-CM | POA: Diagnosis not present

## 2014-06-22 DIAGNOSIS — M47813 Spondylosis without myelopathy or radiculopathy, cervicothoracic region: Secondary | ICD-10-CM | POA: Diagnosis not present

## 2014-06-22 DIAGNOSIS — M9902 Segmental and somatic dysfunction of thoracic region: Secondary | ICD-10-CM | POA: Diagnosis not present

## 2014-06-22 DIAGNOSIS — M461 Sacroiliitis, not elsewhere classified: Secondary | ICD-10-CM | POA: Diagnosis not present

## 2014-06-22 DIAGNOSIS — M9904 Segmental and somatic dysfunction of sacral region: Secondary | ICD-10-CM | POA: Diagnosis not present

## 2014-06-22 DIAGNOSIS — B349 Viral infection, unspecified: Secondary | ICD-10-CM | POA: Diagnosis not present

## 2014-06-22 DIAGNOSIS — M9903 Segmental and somatic dysfunction of lumbar region: Secondary | ICD-10-CM | POA: Diagnosis not present

## 2014-06-22 DIAGNOSIS — M47817 Spondylosis without myelopathy or radiculopathy, lumbosacral region: Secondary | ICD-10-CM | POA: Diagnosis not present

## 2014-07-01 DIAGNOSIS — R109 Unspecified abdominal pain: Secondary | ICD-10-CM | POA: Diagnosis not present

## 2014-07-01 DIAGNOSIS — R1030 Lower abdominal pain, unspecified: Secondary | ICD-10-CM | POA: Diagnosis not present

## 2014-07-01 DIAGNOSIS — R350 Frequency of micturition: Secondary | ICD-10-CM | POA: Diagnosis not present

## 2014-07-04 ENCOUNTER — Other Ambulatory Visit: Payer: Self-pay | Admitting: Pulmonary Disease

## 2014-07-06 DIAGNOSIS — M461 Sacroiliitis, not elsewhere classified: Secondary | ICD-10-CM | POA: Diagnosis not present

## 2014-07-06 DIAGNOSIS — M47817 Spondylosis without myelopathy or radiculopathy, lumbosacral region: Secondary | ICD-10-CM | POA: Diagnosis not present

## 2014-07-06 DIAGNOSIS — M9904 Segmental and somatic dysfunction of sacral region: Secondary | ICD-10-CM | POA: Diagnosis not present

## 2014-07-06 DIAGNOSIS — M9902 Segmental and somatic dysfunction of thoracic region: Secondary | ICD-10-CM | POA: Diagnosis not present

## 2014-07-06 DIAGNOSIS — M9903 Segmental and somatic dysfunction of lumbar region: Secondary | ICD-10-CM | POA: Diagnosis not present

## 2014-07-06 DIAGNOSIS — M47813 Spondylosis without myelopathy or radiculopathy, cervicothoracic region: Secondary | ICD-10-CM | POA: Diagnosis not present

## 2014-07-06 DIAGNOSIS — F4542 Pain disorder with related psychological factors: Secondary | ICD-10-CM | POA: Diagnosis not present

## 2014-07-08 DIAGNOSIS — R109 Unspecified abdominal pain: Secondary | ICD-10-CM | POA: Diagnosis not present

## 2014-07-08 DIAGNOSIS — N508 Other specified disorders of male genital organs: Secondary | ICD-10-CM | POA: Diagnosis not present

## 2014-07-12 ENCOUNTER — Emergency Department (HOSPITAL_BASED_OUTPATIENT_CLINIC_OR_DEPARTMENT_OTHER): Payer: Medicare Other

## 2014-07-12 ENCOUNTER — Encounter (HOSPITAL_BASED_OUTPATIENT_CLINIC_OR_DEPARTMENT_OTHER): Payer: Self-pay

## 2014-07-12 ENCOUNTER — Emergency Department (HOSPITAL_BASED_OUTPATIENT_CLINIC_OR_DEPARTMENT_OTHER)
Admission: EM | Admit: 2014-07-12 | Discharge: 2014-07-12 | Disposition: A | Discharge status: Home / Self Care | Payer: Medicare Other | Attending: Emergency Medicine | Admitting: Emergency Medicine

## 2014-07-12 DIAGNOSIS — N433 Hydrocele, unspecified: Secondary | ICD-10-CM | POA: Diagnosis not present

## 2014-07-12 DIAGNOSIS — Z87828 Personal history of other (healed) physical injury and trauma: Secondary | ICD-10-CM | POA: Insufficient documentation

## 2014-07-12 DIAGNOSIS — Z8739 Personal history of other diseases of the musculoskeletal system and connective tissue: Secondary | ICD-10-CM | POA: Diagnosis not present

## 2014-07-12 DIAGNOSIS — Z9089 Acquired absence of other organs: Secondary | ICD-10-CM | POA: Diagnosis not present

## 2014-07-12 DIAGNOSIS — Z8719 Personal history of other diseases of the digestive system: Secondary | ICD-10-CM | POA: Insufficient documentation

## 2014-07-12 DIAGNOSIS — J45909 Unspecified asthma, uncomplicated: Secondary | ICD-10-CM | POA: Insufficient documentation

## 2014-07-12 DIAGNOSIS — Z79899 Other long term (current) drug therapy: Secondary | ICD-10-CM | POA: Insufficient documentation

## 2014-07-12 DIAGNOSIS — Z8659 Personal history of other mental and behavioral disorders: Secondary | ICD-10-CM | POA: Diagnosis not present

## 2014-07-12 DIAGNOSIS — N200 Calculus of kidney: Secondary | ICD-10-CM | POA: Diagnosis not present

## 2014-07-12 DIAGNOSIS — R10814 Left lower quadrant abdominal tenderness: Secondary | ICD-10-CM

## 2014-07-12 DIAGNOSIS — R1032 Left lower quadrant pain: Secondary | ICD-10-CM | POA: Diagnosis not present

## 2014-07-12 DIAGNOSIS — D3500 Benign neoplasm of unspecified adrenal gland: Secondary | ICD-10-CM | POA: Diagnosis not present

## 2014-07-12 DIAGNOSIS — Z8669 Personal history of other diseases of the nervous system and sense organs: Secondary | ICD-10-CM | POA: Diagnosis not present

## 2014-07-12 LAB — URINALYSIS, ROUTINE W REFLEX MICROSCOPIC
Bilirubin Urine: NEGATIVE
Glucose, UA: NEGATIVE mg/dL
Hgb urine dipstick: NEGATIVE
Ketones, ur: NEGATIVE mg/dL
Leukocytes, UA: NEGATIVE
Nitrite: NEGATIVE
Protein, ur: NEGATIVE mg/dL
Specific Gravity, Urine: 1.024 (ref 1.005–1.030)
Urobilinogen, UA: 0.2 mg/dL (ref 0.0–1.0)
pH: 6 (ref 5.0–8.0)

## 2014-07-12 LAB — COMPREHENSIVE METABOLIC PANEL
ALT: 20 U/L (ref 17–63)
AST: 20 U/L (ref 15–41)
Albumin: 4 g/dL (ref 3.5–5.0)
Alkaline Phosphatase: 61 U/L (ref 38–126)
Anion gap: 4 — ABNORMAL LOW (ref 5–15)
BUN: 15 mg/dL (ref 6–20)
CO2: 25 mmol/L (ref 22–32)
Calcium: 8.5 mg/dL — ABNORMAL LOW (ref 8.9–10.3)
Chloride: 106 mmol/L (ref 101–111)
Creatinine, Ser: 1.02 mg/dL (ref 0.61–1.24)
GFR calc Af Amer: >60 mL/min (ref >60)
GFR calc non Af Amer: >60 mL/min (ref >60)
Glucose, Bld: 93 mg/dL (ref 70–99)
Potassium: 3.9 mmol/L (ref 3.5–5.1)
Sodium: 135 mmol/L (ref 135–145)
Total Bilirubin: 0.5 mg/dL (ref 0.3–1.2)
Total Protein: 7.7 g/dL (ref 6.5–8.1)

## 2014-07-12 LAB — CBC WITH DIFFERENTIAL/PLATELET
Basophils Absolute: 0 10*3/uL (ref 0.0–0.1)
Basophils Relative: 0 % (ref 0–1)
Eosinophils Absolute: 0.2 10*3/uL (ref 0.0–0.7)
Eosinophils Relative: 5 % (ref 0–5)
HCT: 41.6 % (ref 39.0–52.0)
Hemoglobin: 14.3 g/dL (ref 13.0–17.0)
Lymphocytes Relative: 39 % (ref 12–46)
Lymphs Abs: 1.8 10*3/uL (ref 0.7–4.0)
MCH: 28.5 pg (ref 26.0–34.0)
MCHC: 34.4 g/dL (ref 30.0–36.0)
MCV: 83 fL (ref 78.0–100.0)
Monocytes Absolute: 0.6 10*3/uL (ref 0.1–1.0)
Monocytes Relative: 12 % (ref 3–12)
Neutro Abs: 2 10*3/uL (ref 1.7–7.7)
Neutrophils Relative %: 44 % (ref 43–77)
Platelets: 164 10*3/uL (ref 150–400)
RBC: 5.01 MIL/uL (ref 4.22–5.81)
RDW: 13.6 % (ref 11.5–15.5)
WBC: 4.5 10*3/uL (ref 4.0–10.5)

## 2014-07-12 LAB — LIPASE, BLOOD: Lipase: 33 U/L (ref 22–51)

## 2014-07-12 MED ORDER — NAPROXEN 500 MG PO TABS: 500.0000 mg | ORAL_TABLET | Freq: Two times a day (BID) | ORAL | Status: DC

## 2014-07-12 MED ORDER — HYDROCODONE-ACETAMINOPHEN 5-325 MG PO TABS: 1.0000 | ORAL_TABLET | ORAL | Status: DC | PRN

## 2014-07-12 MED ORDER — IOHEXOL 300 MG/ML  SOLN
25.0000 mL | Freq: Once | INTRAMUSCULAR | Status: AC | PRN
  Administered 2014-07-12: 25 mL via ORAL

## 2014-07-12 MED ORDER — MORPHINE SULFATE 2 MG/ML IJ SOLN
2.0000 mg | Freq: Once | INTRAMUSCULAR | Status: AC
  Administered 2014-07-12: 2 mg via INTRAVENOUS
  Filled 2014-07-12: qty 1

## 2014-07-12 MED ORDER — ONDANSETRON HCL 4 MG/2ML IJ SOLN
4.0000 mg | Freq: Once | INTRAMUSCULAR | Status: AC
  Administered 2014-07-12: 4 mg via INTRAVENOUS
  Filled 2014-07-12: qty 2

## 2014-07-12 MED ORDER — IOHEXOL 300 MG/ML  SOLN
100.0000 mL | Freq: Once | INTRAMUSCULAR | Status: AC | PRN
  Administered 2014-07-12: 100 mL via INTRAVENOUS

## 2014-07-12 MED ORDER — LORAZEPAM 2 MG/ML IJ SOLN
1.0000 mg | INTRAMUSCULAR | Status: DC | PRN
  Administered 2014-07-12: 1 mg via INTRAVENOUS
  Filled 2014-07-12: qty 1

## 2014-07-12 MED ORDER — MORPHINE SULFATE 2 MG/ML IJ SOLN
2.0000 mg | INTRAMUSCULAR | Status: AC
  Administered 2014-07-12: 2 mg via INTRAVENOUS
  Filled 2014-07-12: qty 1

## 2014-07-12 MED ORDER — SODIUM CHLORIDE 0.9 % IV BOLUS (SEPSIS)
1000.0000 mL | Freq: Once | INTRAVENOUS | Status: AC
  Administered 2014-07-12: 1000 mL via INTRAVENOUS

## 2014-07-12 NOTE — ED Notes (Signed)
Patient transported to CT 

## 2014-07-12 NOTE — ED Notes (Signed)
Abd pain since 4/21-was seen by PCP-advised he had blood in urine-has been thru 2 rounds of abx

## 2014-07-12 NOTE — ED Provider Notes (Signed)
5:00 PM: At end of shift, received hand-off report from Doyce Loose, [place].  Plan includes review CT and testicular US and if negative may follow-up with PCP.  Pt resting without distress currently.   7:15 PM: CT and [place] results reviewed. CT negative for acute abnormality. US shows mild bilat hydrocele. Discussed case with Dr. Tamera Punt.  Findings discussed with pt and recommended follow-up with urology. Pain managed in the ED. Pt is well-appearing, in no acute distress and vital signs reviewed and not concerning. He appears safe to be discharged.  Return precautions provided. Pt aware of plan and in agreement.    Filed Vitals:   07/12/14 1423 07/12/14 1840 07/12/14 2012  BP: 110/74 116/71 113/66  Pulse: 88 71 68  Temp: 98.1 F (36.7 C)    TempSrc: Oral    Resp: 18 18 16   Height: 5\' 9"  (1.753 m)    Weight: 261 lb (118.389 kg)    SpO2: 100% 97% 97%   Meds given in ED:  Medications  sodium chloride 0.9 % bolus 1,000 mL (0 mLs Intravenous Stopped 07/12/14 1939)  ondansetron (ZOFRAN) injection 4 mg (4 mg Intravenous Given 07/12/14 1512)  iohexol (OMNIPAQUE) 300 MG/ML solution 25 mL (25 mLs Oral Contrast Given 07/12/14 1645)  iohexol (OMNIPAQUE) 300 MG/ML solution 100 mL (100 mLs Intravenous Contrast Given 07/12/14 1645)  iohexol (OMNIPAQUE) 300 MG/ML solution 25 mL (25 mLs Oral Contrast Given 07/12/14 1645)  morphine 2 MG/ML injection 2 mg (2 mg Intravenous Given 07/12/14 1720)  morphine 2 MG/ML injection 2 mg (2 mg Intravenous Given 07/12/14 1938)    Discharge Medication List as of 07/12/2014  7:33 PM    START taking these medications   Details  HYDROcodone-acetaminophen (NORCO/VICODIN) 5-325 MG per tablet Take 1 tablet by mouth every 4 (four) hours as needed., Starting 07/12/2014, Until Discontinued, Print    naproxen (NAPROSYN) 500 MG tablet Take 1 tablet (500 mg total) by mouth 2 (two) times daily., Starting 07/12/2014, Until Discontinued, Print         Britt Bottom, NP 07/13/14 [place], MD 07/13/14 2328

## 2014-07-12 NOTE — Discharge Instructions (Signed)
Please follow the directions provided.  Be sure to call the urologist in the morning to make a follow-up appointment.  Take the naproxen twice a day to help with pain and inflammation.  Take the vicodin for pain not relieved by th naproxen.  Don't hesitate to return for any new, worsening or concerning symptoms.     SEEK MEDICAL CARE IF:  Your scrotum seems to be getting larger.  The area becomes more and more uncomfortable. SEEK IMMEDIATE MEDICAL CARE IF:  You have a fever.
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
## 2014-07-14 DIAGNOSIS — R1030 Lower abdominal pain, unspecified: Secondary | ICD-10-CM | POA: Diagnosis not present

## 2014-07-14 DIAGNOSIS — N200 Calculus of kidney: Secondary | ICD-10-CM | POA: Diagnosis not present

## 2014-07-14 DIAGNOSIS — R1033 Periumbilical pain: Secondary | ICD-10-CM | POA: Diagnosis not present

## 2014-07-14 DIAGNOSIS — N508 Other specified disorders of male genital organs: Secondary | ICD-10-CM | POA: Diagnosis not present
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
## 2014-07-21 ENCOUNTER — Encounter: Payer: Self-pay | Admitting: Cardiovascular Disease

## 2014-07-21 ENCOUNTER — Ambulatory Visit (INDEPENDENT_AMBULATORY_CARE_PROVIDER_SITE_OTHER): Payer: Medicare Other | Admitting: Cardiovascular Disease

## 2014-07-21 VITALS — BP 110/74 | HR 93 | Ht 69.0 in | Wt 280.0 lb

## 2014-07-21 DIAGNOSIS — R0600 Dyspnea, unspecified: Secondary | ICD-10-CM | POA: Diagnosis not present

## 2014-07-21 DIAGNOSIS — M47813 Spondylosis without myelopathy or radiculopathy, cervicothoracic region: Secondary | ICD-10-CM | POA: Diagnosis not present

## 2014-07-21 DIAGNOSIS — M47817 Spondylosis without myelopathy or radiculopathy, lumbosacral region: Secondary | ICD-10-CM | POA: Diagnosis not present

## 2014-07-21 DIAGNOSIS — M9902 Segmental and somatic dysfunction of thoracic region: Secondary | ICD-10-CM | POA: Diagnosis not present

## 2014-07-21 DIAGNOSIS — M9903 Segmental and somatic dysfunction of lumbar region: Secondary | ICD-10-CM | POA: Diagnosis not present

## 2014-07-21 DIAGNOSIS — M461 Sacroiliitis, not elsewhere classified: Secondary | ICD-10-CM | POA: Diagnosis not present

## 2014-07-21 DIAGNOSIS — M9904 Segmental and somatic dysfunction of sacral region: Secondary | ICD-10-CM | POA: Diagnosis not present

## 2014-07-21 NOTE — Patient Instructions (Signed)
Medication Instructions:  NO CHANGES  Labwork: NONE  Testing/Procedures: NONE  Follow-Up: Your physician wants you to follow-up in: YEAR WITH  DR NISHAN You will receive a reminder letter in the mail two months in advance. If you don't receive a letter, please call our office to schedule the follow-up appointment.   Any Other Special Instructions Will Be Listed Below (If Applicable).   

## 2014-07-27 DIAGNOSIS — R1030 Lower abdominal pain, unspecified: Secondary | ICD-10-CM | POA: Diagnosis not present

## 2014-07-27 DIAGNOSIS — R1033 Periumbilical pain: Secondary | ICD-10-CM | POA: Diagnosis not present

## 2014-07-27 DIAGNOSIS — H5213 Myopia, bilateral: Secondary | ICD-10-CM | POA: Diagnosis not present

## 2014-07-27 DIAGNOSIS — H52203 Unspecified astigmatism, bilateral: Secondary | ICD-10-CM | POA: Diagnosis not present

## 2014-07-27 DIAGNOSIS — N508 Other specified disorders of male genital organs: Secondary | ICD-10-CM | POA: Diagnosis not present

## 2014-07-29 DIAGNOSIS — F4310 Post-traumatic stress disorder, unspecified: Secondary | ICD-10-CM | POA: Diagnosis not present

## 2014-08-11 ENCOUNTER — Encounter: Payer: Self-pay | Admitting: Pulmonary Disease

## 2014-08-11 ENCOUNTER — Ambulatory Visit (INDEPENDENT_AMBULATORY_CARE_PROVIDER_SITE_OTHER): Payer: Medicare Other | Admitting: Pulmonary Disease

## 2014-08-11 VITALS — BP 122/70 | HR 97 | Ht 69.0 in | Wt 285.0 lb

## 2014-08-11 DIAGNOSIS — J329 Chronic sinusitis, unspecified: Secondary | ICD-10-CM | POA: Diagnosis not present

## 2014-08-11 DIAGNOSIS — G4733 Obstructive sleep apnea (adult) (pediatric): Secondary | ICD-10-CM | POA: Diagnosis not present

## 2014-08-11 DIAGNOSIS — J45998 Other asthma: Secondary | ICD-10-CM

## 2014-08-11 DIAGNOSIS — M47813 Spondylosis without myelopathy or radiculopathy, cervicothoracic region: Secondary | ICD-10-CM | POA: Diagnosis not present

## 2014-08-11 DIAGNOSIS — M9902 Segmental and somatic dysfunction of thoracic region: Secondary | ICD-10-CM | POA: Diagnosis not present

## 2014-08-11 DIAGNOSIS — M461 Sacroiliitis, not elsewhere classified: Secondary | ICD-10-CM | POA: Diagnosis not present

## 2014-08-11 DIAGNOSIS — M47817 Spondylosis without myelopathy or radiculopathy, lumbosacral region: Secondary | ICD-10-CM | POA: Diagnosis not present

## 2014-08-11 DIAGNOSIS — M9904 Segmental and somatic dysfunction of sacral region: Secondary | ICD-10-CM | POA: Diagnosis not present

## 2014-08-11 DIAGNOSIS — M9903 Segmental and somatic dysfunction of lumbar region: Secondary | ICD-10-CM | POA: Diagnosis not present

## 2014-08-11 MED ORDER — MONTELUKAST SODIUM 10 MG PO TABS: 10.0000 mg | ORAL_TABLET | Freq: Every day | ORAL | Status: DC

## 2014-08-11 MED ORDER — ALBUTEROL SULFATE HFA 108 (90 BASE) MCG/ACT IN AERS: 2.0000 | INHALATION_SPRAY | RESPIRATORY_TRACT | Status: DC | PRN

## 2014-08-11 MED ORDER — MOMETASONE FURO-FORMOTEROL FUM 100-5 MCG/ACT IN AERO: 2.0000 | INHALATION_SPRAY | Freq: Two times a day (BID) | RESPIRATORY_TRACT | Status: DC | PRN

## 2014-08-11 NOTE — Patient Instructions (Signed)
Will schedule home sleep study  Use singulair 10 mg nightly Dulera two puffs twice per day Albuterol two puffs every 4 to 6 hours as needed for cough, wheeze or chest congestion  Your asthma and allergies are typically triggered by inflammation in your airways and sinuses.  The steroid medicine in your inhaler is an anti-inflammatory medicine.  Singulair is also an anti-inflammatory medicine, and works differently than steroid medicines.    Follow up in 6 months
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
## 2014-08-13 ENCOUNTER — Other Ambulatory Visit: Payer: Self-pay | Admitting: General Surgery

## 2014-08-13 DIAGNOSIS — R1030 Lower abdominal pain, unspecified: Secondary | ICD-10-CM | POA: Diagnosis not present

## 2014-08-13 DIAGNOSIS — R1032 Left lower quadrant pain: Secondary | ICD-10-CM

## 2014-08-16 ENCOUNTER — Other Ambulatory Visit: Payer: Self-pay | Admitting: *Deleted

## 2014-08-16 DIAGNOSIS — R1032 Left lower quadrant pain: Secondary | ICD-10-CM

## 2014-08-20 ENCOUNTER — Other Ambulatory Visit: Payer: 59

## 2014-09-06 DIAGNOSIS — M9902 Segmental and somatic dysfunction of thoracic region: Secondary | ICD-10-CM | POA: Diagnosis not present

## 2014-09-06 DIAGNOSIS — M47817 Spondylosis without myelopathy or radiculopathy, lumbosacral region: Secondary | ICD-10-CM | POA: Diagnosis not present

## 2014-09-06 DIAGNOSIS — M461 Sacroiliitis, not elsewhere classified: Secondary | ICD-10-CM | POA: Diagnosis not present

## 2014-09-06 DIAGNOSIS — M9904 Segmental and somatic dysfunction of sacral region: Secondary | ICD-10-CM | POA: Diagnosis not present

## 2014-09-06 DIAGNOSIS — M9903 Segmental and somatic dysfunction of lumbar region: Secondary | ICD-10-CM | POA: Diagnosis not present

## 2014-09-06 DIAGNOSIS — M47813 Spondylosis without myelopathy or radiculopathy, cervicothoracic region: Secondary | ICD-10-CM | POA: Diagnosis not present

## 2014-09-07 DIAGNOSIS — R1030 Lower abdominal pain, unspecified: Secondary | ICD-10-CM | POA: Diagnosis not present

## 2014-09-09 DIAGNOSIS — G4733 Obstructive sleep apnea (adult) (pediatric): Secondary | ICD-10-CM | POA: Diagnosis not present

## 2014-09-15 DIAGNOSIS — G4733 Obstructive sleep apnea (adult) (pediatric): Secondary | ICD-10-CM | POA: Diagnosis not present

## 2014-09-15 DIAGNOSIS — G4730 Sleep apnea, unspecified: Secondary | ICD-10-CM | POA: Diagnosis not present

## 2014-09-16 ENCOUNTER — Telehealth: Payer: Self-pay | Admitting: Pulmonary Disease

## 2014-09-16 ENCOUNTER — Other Ambulatory Visit: Payer: Self-pay | Admitting: *Deleted

## 2014-09-16 DIAGNOSIS — G4733 Obstructive sleep apnea (adult) (pediatric): Secondary | ICD-10-CM

## 2014-09-16 NOTE — Telephone Encounter (Signed)
[phone] returning a call

## 2014-09-16 NOTE — Telephone Encounter (Signed)
lmomtcb x1 

## 2014-09-16 NOTE — Telephone Encounter (Signed)
Spoke with pt. He is aware of results. Would prefer to have OV first. Has been scheduled for 09/20/14 at 1:30pm.

## 2014-09-16 NOTE — Telephone Encounter (Signed)
HST 09/09/14 >> AHI 26.4, SaO2 low 85%  Will have my nurse inform pt that sleep study shows moderate sleep apnea.  Options are 1) CPAP now, 2) ROV first.  If He is agreeable to CPAP, then please send order for auto CPAP range 5 to 15 cm H2O with heated humidity and mask of choice.  Have download sent 1 month after starting CPAP and set up ROV 2 months after starting CPAP.

## 2014-09-20 ENCOUNTER — Ambulatory Visit (INDEPENDENT_AMBULATORY_CARE_PROVIDER_SITE_OTHER): Payer: Medicare Other | Admitting: Pulmonary Disease

## 2014-09-20 ENCOUNTER — Encounter: Payer: Self-pay | Admitting: Pulmonary Disease

## 2014-09-20 VITALS — BP 134/92 | HR 75 | Ht 69.0 in | Wt 283.6 lb

## 2014-09-20 DIAGNOSIS — G4733 Obstructive sleep apnea (adult) (pediatric): Secondary | ICD-10-CM | POA: Diagnosis not present

## 2014-09-20 NOTE — Patient Instructions (Signed)
Will arrange for new CPAP set up Follow up in 2 months after CPAP set up
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
## 2014-09-23 DIAGNOSIS — F4310 Post-traumatic stress disorder, unspecified: Secondary | ICD-10-CM | POA: Diagnosis not present

## 2014-09-29 DIAGNOSIS — K219 Gastro-esophageal reflux disease without esophagitis: Secondary | ICD-10-CM | POA: Diagnosis not present

## 2014-09-29 DIAGNOSIS — R109 Unspecified abdominal pain: Secondary | ICD-10-CM | POA: Diagnosis not present

## 2014-10-04 DIAGNOSIS — M9902 Segmental and somatic dysfunction of thoracic region: Secondary | ICD-10-CM | POA: Diagnosis not present

## 2014-10-04 DIAGNOSIS — M9903 Segmental and somatic dysfunction of lumbar region: Secondary | ICD-10-CM | POA: Diagnosis not present

## 2014-10-04 DIAGNOSIS — M9904 Segmental and somatic dysfunction of sacral region: Secondary | ICD-10-CM | POA: Diagnosis not present

## 2014-10-04 DIAGNOSIS — M47813 Spondylosis without myelopathy or radiculopathy, cervicothoracic region: Secondary | ICD-10-CM | POA: Diagnosis not present

## 2014-10-04 DIAGNOSIS — M47817 Spondylosis without myelopathy or radiculopathy, lumbosacral region: Secondary | ICD-10-CM | POA: Diagnosis not present

## 2014-10-04 DIAGNOSIS — M461 Sacroiliitis, not elsewhere classified: Secondary | ICD-10-CM | POA: Diagnosis not present

## 2014-10-26 DIAGNOSIS — F4310 Post-traumatic stress disorder, unspecified: Secondary | ICD-10-CM | POA: Diagnosis not present

## 2014-11-09 DIAGNOSIS — F4310 Post-traumatic stress disorder, unspecified: Secondary | ICD-10-CM | POA: Diagnosis not present

## 2014-11-23 DIAGNOSIS — F4310 Post-traumatic stress disorder, unspecified: Secondary | ICD-10-CM | POA: Diagnosis not present

## 2014-12-03 DIAGNOSIS — R109 Unspecified abdominal pain: Secondary | ICD-10-CM | POA: Diagnosis not present

## 2014-12-03 DIAGNOSIS — K219 Gastro-esophageal reflux disease without esophagitis: Secondary | ICD-10-CM | POA: Diagnosis not present

## 2014-12-09 DIAGNOSIS — R3915 Urgency of urination: Secondary | ICD-10-CM | POA: Diagnosis not present

## 2014-12-09 DIAGNOSIS — R1030 Lower abdominal pain, unspecified: Secondary | ICD-10-CM | POA: Diagnosis not present

## 2014-12-09 DIAGNOSIS — R10819 Abdominal tenderness, unspecified site: Secondary | ICD-10-CM | POA: Diagnosis not present

## 2014-12-14 DIAGNOSIS — F4310 Post-traumatic stress disorder, unspecified: Secondary | ICD-10-CM | POA: Diagnosis not present

## 2014-12-14 DIAGNOSIS — R3915 Urgency of urination: Secondary | ICD-10-CM | POA: Diagnosis not present

## 2014-12-14 DIAGNOSIS — R10819 Abdominal tenderness, unspecified site: Secondary | ICD-10-CM | POA: Diagnosis not present

## 2014-12-14 DIAGNOSIS — R1030 Lower abdominal pain, unspecified: Secondary | ICD-10-CM | POA: Diagnosis not present

## 2014-12-15 DIAGNOSIS — R110 Nausea: Secondary | ICD-10-CM | POA: Diagnosis not present

## 2014-12-15 DIAGNOSIS — K449 Diaphragmatic hernia without obstruction or gangrene: Secondary | ICD-10-CM | POA: Diagnosis not present

## 2014-12-15 DIAGNOSIS — K219 Gastro-esophageal reflux disease without esophagitis: Secondary | ICD-10-CM | POA: Diagnosis not present

## 2014-12-30 ENCOUNTER — Ambulatory Visit (INDEPENDENT_AMBULATORY_CARE_PROVIDER_SITE_OTHER): Payer: Medicare Other | Admitting: Pulmonary Disease

## 2014-12-30 ENCOUNTER — Encounter: Payer: Self-pay | Admitting: Pulmonary Disease

## 2014-12-30 VITALS — BP 120/68 | HR 71 | Ht 69.0 in | Wt 283.4 lb

## 2014-12-30 DIAGNOSIS — G4733 Obstructive sleep apnea (adult) (pediatric): Secondary | ICD-10-CM

## 2014-12-30 DIAGNOSIS — Z6841 Body Mass Index (BMI) 40.0 and over, adult: Secondary | ICD-10-CM | POA: Diagnosis not present

## 2014-12-30 DIAGNOSIS — J45998 Other asthma: Secondary | ICD-10-CM | POA: Diagnosis not present

## 2014-12-30 DIAGNOSIS — Z23 Encounter for immunization: Secondary | ICD-10-CM

## 2014-12-30 NOTE — Patient Instructions (Signed)
Flu shot today Follow up in 6 months 
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
## 2015-01-13 ENCOUNTER — Other Ambulatory Visit (HOSPITAL_COMMUNITY): Payer: Self-pay | Admitting: Gastroenterology

## 2015-01-13 DIAGNOSIS — F4310 Post-traumatic stress disorder, unspecified: Secondary | ICD-10-CM | POA: Diagnosis not present

## 2015-01-13 DIAGNOSIS — R5383 Other fatigue: Secondary | ICD-10-CM | POA: Diagnosis not present

## 2015-01-13 DIAGNOSIS — R109 Unspecified abdominal pain: Secondary | ICD-10-CM | POA: Diagnosis not present

## 2015-01-13 DIAGNOSIS — K921 Melena: Secondary | ICD-10-CM | POA: Diagnosis not present

## 2015-01-13 DIAGNOSIS — R1084 Generalized abdominal pain: Secondary | ICD-10-CM

## 2015-01-20 ENCOUNTER — Ambulatory Visit (HOSPITAL_COMMUNITY)
Admission: RE | Admit: 2015-01-20 | Discharge: 2015-01-20 | Disposition: A | Discharge status: Home / Self Care | Payer: Medicare Other | Source: Ambulatory Visit | Attending: Gastroenterology | Admitting: Gastroenterology

## 2015-01-20 DIAGNOSIS — R5383 Other fatigue: Secondary | ICD-10-CM | POA: Insufficient documentation

## 2015-01-20 DIAGNOSIS — K769 Liver disease, unspecified: Secondary | ICD-10-CM | POA: Diagnosis not present

## 2015-01-20 DIAGNOSIS — N200 Calculus of kidney: Secondary | ICD-10-CM | POA: Insufficient documentation

## 2015-01-20 DIAGNOSIS — R1084 Generalized abdominal pain: Secondary | ICD-10-CM | POA: Insufficient documentation

## 2015-01-20 DIAGNOSIS — N132 Hydronephrosis with renal and ureteral calculous obstruction: Secondary | ICD-10-CM | POA: Diagnosis not present

## 2015-01-20 DIAGNOSIS — D3501 Benign neoplasm of right adrenal gland: Secondary | ICD-10-CM | POA: Diagnosis not present

## 2015-01-20 MED ORDER — DIAZEPAM 5 MG PO TABS
ORAL_TABLET | ORAL | Status: AC
  Filled 2015-01-20: qty 1

## 2015-01-20 MED ORDER — DIAZEPAM 5 MG PO TABS: 20.0000 mg | ORAL_TABLET | Freq: Once | ORAL | Status: DC

## 2015-01-20 MED ORDER — DIAZEPAM 5 MG PO TABS
10.0000 mg | ORAL_TABLET | Freq: Once | ORAL | Status: AC
  Administered 2015-01-20: 10 mg via ORAL
  Filled 2015-01-20: qty 2

## 2015-01-20 MED ORDER — IOHEXOL 300 MG/ML  SOLN
100.0000 mL | Freq: Once | INTRAMUSCULAR | Status: AC | PRN
  Administered 2015-01-20: 100 mL via INTRAVENOUS

## 2015-01-27 DIAGNOSIS — F4310 Post-traumatic stress disorder, unspecified: Secondary | ICD-10-CM | POA: Diagnosis not present

## 2015-02-08 DIAGNOSIS — F4310 Post-traumatic stress disorder, unspecified: Secondary | ICD-10-CM | POA: Diagnosis not present

## 2015-03-31 DIAGNOSIS — M549 Dorsalgia, unspecified: Secondary | ICD-10-CM | POA: Diagnosis not present

## 2015-04-13 DIAGNOSIS — M791 Myalgia: Secondary | ICD-10-CM | POA: Diagnosis not present

## 2015-04-13 DIAGNOSIS — M25551 Pain in right hip: Secondary | ICD-10-CM | POA: Diagnosis not present

## 2015-04-13 DIAGNOSIS — M5417 Radiculopathy, lumbosacral region: Secondary | ICD-10-CM | POA: Diagnosis not present

## 2015-04-13 DIAGNOSIS — M461 Sacroiliitis, not elsewhere classified: Secondary | ICD-10-CM | POA: Diagnosis not present

## 2015-04-13 DIAGNOSIS — M545 Low back pain: Secondary | ICD-10-CM | POA: Diagnosis not present

## 2015-04-13 DIAGNOSIS — M542 Cervicalgia: Secondary | ICD-10-CM | POA: Diagnosis not present

## 2015-04-13 DIAGNOSIS — M4726 Other spondylosis with radiculopathy, lumbar region: Secondary | ICD-10-CM | POA: Diagnosis not present

## 2015-04-19 DIAGNOSIS — M9903 Segmental and somatic dysfunction of lumbar region: Secondary | ICD-10-CM | POA: Diagnosis not present

## 2015-04-19 DIAGNOSIS — M791 Myalgia: Secondary | ICD-10-CM | POA: Diagnosis not present

## 2015-04-19 DIAGNOSIS — M9901 Segmental and somatic dysfunction of cervical region: Secondary | ICD-10-CM | POA: Diagnosis not present

## 2015-04-19 DIAGNOSIS — M5137 Other intervertebral disc degeneration, lumbosacral region: Secondary | ICD-10-CM | POA: Diagnosis not present

## 2015-04-21 DIAGNOSIS — M4726 Other spondylosis with radiculopathy, lumbar region: Secondary | ICD-10-CM | POA: Diagnosis not present

## 2015-04-21 DIAGNOSIS — M5137 Other intervertebral disc degeneration, lumbosacral region: Secondary | ICD-10-CM | POA: Diagnosis not present

## 2015-04-21 DIAGNOSIS — M9903 Segmental and somatic dysfunction of lumbar region: Secondary | ICD-10-CM | POA: Diagnosis not present

## 2015-04-21 DIAGNOSIS — M5417 Radiculopathy, lumbosacral region: Secondary | ICD-10-CM | POA: Diagnosis not present

## 2015-04-21 DIAGNOSIS — M9901 Segmental and somatic dysfunction of cervical region: Secondary | ICD-10-CM | POA: Diagnosis not present

## 2015-04-21 DIAGNOSIS — M791 Myalgia: Secondary | ICD-10-CM | POA: Diagnosis not present

## 2015-04-21 DIAGNOSIS — M545 Low back pain: Secondary | ICD-10-CM | POA: Diagnosis not present

## 2015-04-21 DIAGNOSIS — M461 Sacroiliitis, not elsewhere classified: Secondary | ICD-10-CM | POA: Diagnosis not present

## 2015-04-22 DIAGNOSIS — M9901 Segmental and somatic dysfunction of cervical region: Secondary | ICD-10-CM | POA: Diagnosis not present

## 2015-04-22 DIAGNOSIS — M5417 Radiculopathy, lumbosacral region: Secondary | ICD-10-CM | POA: Diagnosis not present

## 2015-04-22 DIAGNOSIS — M791 Myalgia: Secondary | ICD-10-CM | POA: Diagnosis not present

## 2015-04-22 DIAGNOSIS — M4726 Other spondylosis with radiculopathy, lumbar region: Secondary | ICD-10-CM | POA: Diagnosis not present

## 2015-04-22 DIAGNOSIS — M9903 Segmental and somatic dysfunction of lumbar region: Secondary | ICD-10-CM | POA: Diagnosis not present

## 2015-04-22 DIAGNOSIS — M545 Low back pain: Secondary | ICD-10-CM | POA: Diagnosis not present

## 2015-04-22 DIAGNOSIS — M461 Sacroiliitis, not elsewhere classified: Secondary | ICD-10-CM | POA: Diagnosis not present

## 2015-04-22 DIAGNOSIS — M542 Cervicalgia: Secondary | ICD-10-CM | POA: Diagnosis not present

## 2015-04-22 DIAGNOSIS — M5137 Other intervertebral disc degeneration, lumbosacral region: Secondary | ICD-10-CM | POA: Diagnosis not present

## 2015-04-25 DIAGNOSIS — M5417 Radiculopathy, lumbosacral region: Secondary | ICD-10-CM | POA: Diagnosis not present

## 2015-04-25 DIAGNOSIS — M791 Myalgia: Secondary | ICD-10-CM | POA: Diagnosis not present

## 2015-04-25 DIAGNOSIS — M461 Sacroiliitis, not elsewhere classified: Secondary | ICD-10-CM | POA: Diagnosis not present

## 2015-04-25 DIAGNOSIS — M4726 Other spondylosis with radiculopathy, lumbar region: Secondary | ICD-10-CM | POA: Diagnosis not present

## 2015-04-25 DIAGNOSIS — M5137 Other intervertebral disc degeneration, lumbosacral region: Secondary | ICD-10-CM | POA: Diagnosis not present

## 2015-04-25 DIAGNOSIS — M9901 Segmental and somatic dysfunction of cervical region: Secondary | ICD-10-CM | POA: Diagnosis not present

## 2015-04-25 DIAGNOSIS — M542 Cervicalgia: Secondary | ICD-10-CM | POA: Diagnosis not present

## 2015-04-25 DIAGNOSIS — M9903 Segmental and somatic dysfunction of lumbar region: Secondary | ICD-10-CM | POA: Diagnosis not present

## 2015-04-25 DIAGNOSIS — M545 Low back pain: Secondary | ICD-10-CM | POA: Diagnosis not present

## 2015-04-27 DIAGNOSIS — M9901 Segmental and somatic dysfunction of cervical region: Secondary | ICD-10-CM | POA: Diagnosis not present

## 2015-04-27 DIAGNOSIS — M791 Myalgia: Secondary | ICD-10-CM | POA: Diagnosis not present

## 2015-04-27 DIAGNOSIS — M461 Sacroiliitis, not elsewhere classified: Secondary | ICD-10-CM | POA: Diagnosis not present

## 2015-04-27 DIAGNOSIS — M5137 Other intervertebral disc degeneration, lumbosacral region: Secondary | ICD-10-CM | POA: Diagnosis not present

## 2015-04-27 DIAGNOSIS — M4726 Other spondylosis with radiculopathy, lumbar region: Secondary | ICD-10-CM | POA: Diagnosis not present

## 2015-04-27 DIAGNOSIS — M545 Low back pain: Secondary | ICD-10-CM | POA: Diagnosis not present

## 2015-04-27 DIAGNOSIS — M9903 Segmental and somatic dysfunction of lumbar region: Secondary | ICD-10-CM | POA: Diagnosis not present

## 2015-04-27 DIAGNOSIS — M5417 Radiculopathy, lumbosacral region: Secondary | ICD-10-CM | POA: Diagnosis not present

## 2015-04-29 DIAGNOSIS — M9903 Segmental and somatic dysfunction of lumbar region: Secondary | ICD-10-CM | POA: Diagnosis not present

## 2015-04-29 DIAGNOSIS — M9901 Segmental and somatic dysfunction of cervical region: Secondary | ICD-10-CM | POA: Diagnosis not present

## 2015-04-29 DIAGNOSIS — M5137 Other intervertebral disc degeneration, lumbosacral region: Secondary | ICD-10-CM | POA: Diagnosis not present

## 2015-04-29 DIAGNOSIS — M791 Myalgia: Secondary | ICD-10-CM | POA: Diagnosis not present

## 2015-05-02 DIAGNOSIS — M5417 Radiculopathy, lumbosacral region: Secondary | ICD-10-CM | POA: Diagnosis not present

## 2015-05-02 DIAGNOSIS — M4726 Other spondylosis with radiculopathy, lumbar region: Secondary | ICD-10-CM | POA: Diagnosis not present

## 2015-05-02 DIAGNOSIS — M9903 Segmental and somatic dysfunction of lumbar region: Secondary | ICD-10-CM | POA: Diagnosis not present

## 2015-05-02 DIAGNOSIS — M9901 Segmental and somatic dysfunction of cervical region: Secondary | ICD-10-CM | POA: Diagnosis not present

## 2015-05-02 DIAGNOSIS — M5137 Other intervertebral disc degeneration, lumbosacral region: Secondary | ICD-10-CM | POA: Diagnosis not present

## 2015-05-02 DIAGNOSIS — M791 Myalgia: Secondary | ICD-10-CM | POA: Diagnosis not present

## 2015-05-02 DIAGNOSIS — M461 Sacroiliitis, not elsewhere classified: Secondary | ICD-10-CM | POA: Diagnosis not present

## 2015-05-04 DIAGNOSIS — M9901 Segmental and somatic dysfunction of cervical region: Secondary | ICD-10-CM | POA: Diagnosis not present

## 2015-05-04 DIAGNOSIS — M9903 Segmental and somatic dysfunction of lumbar region: Secondary | ICD-10-CM | POA: Diagnosis not present

## 2015-05-04 DIAGNOSIS — M4726 Other spondylosis with radiculopathy, lumbar region: Secondary | ICD-10-CM | POA: Diagnosis not present

## 2015-05-04 DIAGNOSIS — M791 Myalgia: Secondary | ICD-10-CM | POA: Diagnosis not present

## 2015-05-04 DIAGNOSIS — M461 Sacroiliitis, not elsewhere classified: Secondary | ICD-10-CM | POA: Diagnosis not present

## 2015-05-04 DIAGNOSIS — M545 Low back pain: Secondary | ICD-10-CM | POA: Diagnosis not present

## 2015-05-04 DIAGNOSIS — M5417 Radiculopathy, lumbosacral region: Secondary | ICD-10-CM | POA: Diagnosis not present

## 2015-05-04 DIAGNOSIS — M5137 Other intervertebral disc degeneration, lumbosacral region: Secondary | ICD-10-CM | POA: Diagnosis not present

## 2015-05-06 DIAGNOSIS — M9903 Segmental and somatic dysfunction of lumbar region: Secondary | ICD-10-CM | POA: Diagnosis not present

## 2015-05-06 DIAGNOSIS — M5137 Other intervertebral disc degeneration, lumbosacral region: Secondary | ICD-10-CM | POA: Diagnosis not present

## 2015-05-06 DIAGNOSIS — M4726 Other spondylosis with radiculopathy, lumbar region: Secondary | ICD-10-CM | POA: Diagnosis not present

## 2015-05-06 DIAGNOSIS — M791 Myalgia: Secondary | ICD-10-CM | POA: Diagnosis not present

## 2015-05-06 DIAGNOSIS — M461 Sacroiliitis, not elsewhere classified: Secondary | ICD-10-CM | POA: Diagnosis not present

## 2015-05-06 DIAGNOSIS — M545 Low back pain: Secondary | ICD-10-CM | POA: Diagnosis not present

## 2015-05-06 DIAGNOSIS — M5417 Radiculopathy, lumbosacral region: Secondary | ICD-10-CM | POA: Diagnosis not present

## 2015-05-06 DIAGNOSIS — M9901 Segmental and somatic dysfunction of cervical region: Secondary | ICD-10-CM | POA: Diagnosis not present

## 2015-05-09 DIAGNOSIS — M461 Sacroiliitis, not elsewhere classified: Secondary | ICD-10-CM | POA: Diagnosis not present

## 2015-05-09 DIAGNOSIS — M9903 Segmental and somatic dysfunction of lumbar region: Secondary | ICD-10-CM | POA: Diagnosis not present

## 2015-05-09 DIAGNOSIS — M5137 Other intervertebral disc degeneration, lumbosacral region: Secondary | ICD-10-CM | POA: Diagnosis not present

## 2015-05-09 DIAGNOSIS — M4726 Other spondylosis with radiculopathy, lumbar region: Secondary | ICD-10-CM | POA: Diagnosis not present

## 2015-05-09 DIAGNOSIS — M9901 Segmental and somatic dysfunction of cervical region: Secondary | ICD-10-CM | POA: Diagnosis not present

## 2015-05-09 DIAGNOSIS — M791 Myalgia: Secondary | ICD-10-CM | POA: Diagnosis not present

## 2015-05-09 DIAGNOSIS — M5417 Radiculopathy, lumbosacral region: Secondary | ICD-10-CM | POA: Diagnosis not present

## 2015-05-11 DIAGNOSIS — M5137 Other intervertebral disc degeneration, lumbosacral region: Secondary | ICD-10-CM | POA: Diagnosis not present

## 2015-05-11 DIAGNOSIS — M4726 Other spondylosis with radiculopathy, lumbar region: Secondary | ICD-10-CM | POA: Diagnosis not present

## 2015-05-11 DIAGNOSIS — M5417 Radiculopathy, lumbosacral region: Secondary | ICD-10-CM | POA: Diagnosis not present

## 2015-05-11 DIAGNOSIS — M461 Sacroiliitis, not elsewhere classified: Secondary | ICD-10-CM | POA: Diagnosis not present

## 2015-05-11 DIAGNOSIS — M9901 Segmental and somatic dysfunction of cervical region: Secondary | ICD-10-CM | POA: Diagnosis not present

## 2015-05-11 DIAGNOSIS — M791 Myalgia: Secondary | ICD-10-CM | POA: Diagnosis not present

## 2015-05-11 DIAGNOSIS — M545 Low back pain: Secondary | ICD-10-CM | POA: Diagnosis not present

## 2015-05-11 DIAGNOSIS — M9903 Segmental and somatic dysfunction of lumbar region: Secondary | ICD-10-CM | POA: Diagnosis not present

## 2015-05-13 DIAGNOSIS — M9901 Segmental and somatic dysfunction of cervical region: Secondary | ICD-10-CM | POA: Diagnosis not present

## 2015-05-13 DIAGNOSIS — M461 Sacroiliitis, not elsewhere classified: Secondary | ICD-10-CM | POA: Diagnosis not present

## 2015-05-13 DIAGNOSIS — M545 Low back pain: Secondary | ICD-10-CM | POA: Diagnosis not present

## 2015-05-13 DIAGNOSIS — M9903 Segmental and somatic dysfunction of lumbar region: Secondary | ICD-10-CM | POA: Diagnosis not present

## 2015-05-13 DIAGNOSIS — M4726 Other spondylosis with radiculopathy, lumbar region: Secondary | ICD-10-CM | POA: Diagnosis not present

## 2015-05-13 DIAGNOSIS — M791 Myalgia: Secondary | ICD-10-CM | POA: Diagnosis not present

## 2015-05-13 DIAGNOSIS — M5417 Radiculopathy, lumbosacral region: Secondary | ICD-10-CM | POA: Diagnosis not present

## 2015-05-13 DIAGNOSIS — M5137 Other intervertebral disc degeneration, lumbosacral region: Secondary | ICD-10-CM | POA: Diagnosis not present

## 2015-05-16 DIAGNOSIS — M5137 Other intervertebral disc degeneration, lumbosacral region: Secondary | ICD-10-CM | POA: Diagnosis not present

## 2015-05-16 DIAGNOSIS — M50322 Other cervical disc degeneration at C5-C6 level: Secondary | ICD-10-CM | POA: Diagnosis not present

## 2015-05-16 DIAGNOSIS — M461 Sacroiliitis, not elsewhere classified: Secondary | ICD-10-CM | POA: Diagnosis not present

## 2015-05-16 DIAGNOSIS — M791 Myalgia: Secondary | ICD-10-CM | POA: Diagnosis not present

## 2015-05-16 DIAGNOSIS — M9903 Segmental and somatic dysfunction of lumbar region: Secondary | ICD-10-CM | POA: Diagnosis not present

## 2015-05-16 DIAGNOSIS — M9901 Segmental and somatic dysfunction of cervical region: Secondary | ICD-10-CM | POA: Diagnosis not present

## 2015-05-16 DIAGNOSIS — M50222 Other cervical disc displacement at C5-C6 level: Secondary | ICD-10-CM | POA: Diagnosis not present

## 2015-05-18 DIAGNOSIS — M5136 Other intervertebral disc degeneration, lumbar region: Secondary | ICD-10-CM | POA: Diagnosis not present

## 2015-05-18 DIAGNOSIS — M9901 Segmental and somatic dysfunction of cervical region: Secondary | ICD-10-CM | POA: Diagnosis not present

## 2015-05-18 DIAGNOSIS — M461 Sacroiliitis, not elsewhere classified: Secondary | ICD-10-CM | POA: Diagnosis not present

## 2015-05-18 DIAGNOSIS — M47814 Spondylosis without myelopathy or radiculopathy, thoracic region: Secondary | ICD-10-CM | POA: Diagnosis not present

## 2015-05-18 DIAGNOSIS — M47816 Spondylosis without myelopathy or radiculopathy, lumbar region: Secondary | ICD-10-CM | POA: Diagnosis not present

## 2015-05-18 DIAGNOSIS — M9903 Segmental and somatic dysfunction of lumbar region: Secondary | ICD-10-CM | POA: Diagnosis not present

## 2015-05-18 DIAGNOSIS — Z79891 Long term (current) use of opiate analgesic: Secondary | ICD-10-CM | POA: Diagnosis not present

## 2015-05-18 DIAGNOSIS — G8929 Other chronic pain: Secondary | ICD-10-CM | POA: Diagnosis not present

## 2015-05-18 DIAGNOSIS — M791 Myalgia: Secondary | ICD-10-CM | POA: Diagnosis not present

## 2015-05-18 DIAGNOSIS — M50222 Other cervical disc displacement at C5-C6 level: Secondary | ICD-10-CM | POA: Diagnosis not present

## 2015-05-18 DIAGNOSIS — M5134 Other intervertebral disc degeneration, thoracic region: Secondary | ICD-10-CM | POA: Diagnosis not present

## 2015-05-18 DIAGNOSIS — M50322 Other cervical disc degeneration at C5-C6 level: Secondary | ICD-10-CM | POA: Diagnosis not present

## 2015-05-18 DIAGNOSIS — M5137 Other intervertebral disc degeneration, lumbosacral region: Secondary | ICD-10-CM | POA: Diagnosis not present

## 2015-05-18 DIAGNOSIS — M545 Low back pain: Secondary | ICD-10-CM | POA: Diagnosis not present

## 2015-05-18 DIAGNOSIS — Z79899 Other long term (current) drug therapy: Secondary | ICD-10-CM | POA: Diagnosis not present

## 2015-05-20 DIAGNOSIS — M5137 Other intervertebral disc degeneration, lumbosacral region: Secondary | ICD-10-CM | POA: Diagnosis not present

## 2015-05-20 DIAGNOSIS — M50222 Other cervical disc displacement at C5-C6 level: Secondary | ICD-10-CM | POA: Diagnosis not present

## 2015-05-20 DIAGNOSIS — M9903 Segmental and somatic dysfunction of lumbar region: Secondary | ICD-10-CM | POA: Diagnosis not present

## 2015-05-20 DIAGNOSIS — M50322 Other cervical disc degeneration at C5-C6 level: Secondary | ICD-10-CM | POA: Diagnosis not present

## 2015-05-20 DIAGNOSIS — M461 Sacroiliitis, not elsewhere classified: Secondary | ICD-10-CM | POA: Diagnosis not present

## 2015-05-20 DIAGNOSIS — M542 Cervicalgia: Secondary | ICD-10-CM | POA: Diagnosis not present

## 2015-05-20 DIAGNOSIS — M9901 Segmental and somatic dysfunction of cervical region: Secondary | ICD-10-CM | POA: Diagnosis not present

## 2015-05-20 DIAGNOSIS — M791 Myalgia: Secondary | ICD-10-CM | POA: Diagnosis not present

## 2015-05-23 DIAGNOSIS — M9903 Segmental and somatic dysfunction of lumbar region: Secondary | ICD-10-CM | POA: Diagnosis not present

## 2015-05-23 DIAGNOSIS — M5137 Other intervertebral disc degeneration, lumbosacral region: Secondary | ICD-10-CM | POA: Diagnosis not present

## 2015-05-23 DIAGNOSIS — M50322 Other cervical disc degeneration at C5-C6 level: Secondary | ICD-10-CM | POA: Diagnosis not present

## 2015-05-23 DIAGNOSIS — M50222 Other cervical disc displacement at C5-C6 level: Secondary | ICD-10-CM | POA: Diagnosis not present

## 2015-05-23 DIAGNOSIS — M461 Sacroiliitis, not elsewhere classified: Secondary | ICD-10-CM | POA: Diagnosis not present

## 2015-05-23 DIAGNOSIS — M791 Myalgia: Secondary | ICD-10-CM | POA: Diagnosis not present

## 2015-05-23 DIAGNOSIS — M9901 Segmental and somatic dysfunction of cervical region: Secondary | ICD-10-CM | POA: Diagnosis not present

## 2015-05-24 DIAGNOSIS — M50322 Other cervical disc degeneration at C5-C6 level: Secondary | ICD-10-CM | POA: Diagnosis not present

## 2015-05-24 DIAGNOSIS — M50222 Other cervical disc displacement at C5-C6 level: Secondary | ICD-10-CM | POA: Diagnosis not present

## 2015-05-24 DIAGNOSIS — M791 Myalgia: Secondary | ICD-10-CM | POA: Diagnosis not present

## 2015-05-24 DIAGNOSIS — M461 Sacroiliitis, not elsewhere classified: Secondary | ICD-10-CM | POA: Diagnosis not present

## 2015-05-24 DIAGNOSIS — M542 Cervicalgia: Secondary | ICD-10-CM | POA: Diagnosis not present

## 2015-05-25 DIAGNOSIS — M9903 Segmental and somatic dysfunction of lumbar region: Secondary | ICD-10-CM | POA: Diagnosis not present

## 2015-05-25 DIAGNOSIS — M9901 Segmental and somatic dysfunction of cervical region: Secondary | ICD-10-CM | POA: Diagnosis not present

## 2015-05-25 DIAGNOSIS — M542 Cervicalgia: Secondary | ICD-10-CM | POA: Diagnosis not present

## 2015-05-25 DIAGNOSIS — M461 Sacroiliitis, not elsewhere classified: Secondary | ICD-10-CM | POA: Diagnosis not present

## 2015-05-25 DIAGNOSIS — M50222 Other cervical disc displacement at C5-C6 level: Secondary | ICD-10-CM | POA: Diagnosis not present

## 2015-05-25 DIAGNOSIS — M5137 Other intervertebral disc degeneration, lumbosacral region: Secondary | ICD-10-CM | POA: Diagnosis not present

## 2015-05-25 DIAGNOSIS — M50322 Other cervical disc degeneration at C5-C6 level: Secondary | ICD-10-CM | POA: Diagnosis not present

## 2015-05-25 DIAGNOSIS — M791 Myalgia: Secondary | ICD-10-CM | POA: Diagnosis not present

## 2015-05-30 DIAGNOSIS — M461 Sacroiliitis, not elsewhere classified: Secondary | ICD-10-CM | POA: Diagnosis not present

## 2015-05-30 DIAGNOSIS — M9901 Segmental and somatic dysfunction of cervical region: Secondary | ICD-10-CM | POA: Diagnosis not present

## 2015-05-30 DIAGNOSIS — M5137 Other intervertebral disc degeneration, lumbosacral region: Secondary | ICD-10-CM | POA: Diagnosis not present

## 2015-05-30 DIAGNOSIS — M50222 Other cervical disc displacement at C5-C6 level: Secondary | ICD-10-CM | POA: Diagnosis not present

## 2015-05-30 DIAGNOSIS — M50322 Other cervical disc degeneration at C5-C6 level: Secondary | ICD-10-CM | POA: Diagnosis not present

## 2015-05-30 DIAGNOSIS — M9903 Segmental and somatic dysfunction of lumbar region: Secondary | ICD-10-CM | POA: Diagnosis not present

## 2015-05-30 DIAGNOSIS — M791 Myalgia: Secondary | ICD-10-CM | POA: Diagnosis not present

## 2015-06-01 DIAGNOSIS — M791 Myalgia: Secondary | ICD-10-CM | POA: Diagnosis not present

## 2015-06-01 DIAGNOSIS — M50222 Other cervical disc displacement at C5-C6 level: Secondary | ICD-10-CM | POA: Diagnosis not present

## 2015-06-01 DIAGNOSIS — M461 Sacroiliitis, not elsewhere classified: Secondary | ICD-10-CM | POA: Diagnosis not present

## 2015-06-01 DIAGNOSIS — M50322 Other cervical disc degeneration at C5-C6 level: Secondary | ICD-10-CM | POA: Diagnosis not present

## 2015-06-01 DIAGNOSIS — M5137 Other intervertebral disc degeneration, lumbosacral region: Secondary | ICD-10-CM | POA: Diagnosis not present

## 2015-06-01 DIAGNOSIS — M9903 Segmental and somatic dysfunction of lumbar region: Secondary | ICD-10-CM | POA: Diagnosis not present

## 2015-06-01 DIAGNOSIS — M9901 Segmental and somatic dysfunction of cervical region: Secondary | ICD-10-CM | POA: Diagnosis not present

## 2015-06-01 DIAGNOSIS — M542 Cervicalgia: Secondary | ICD-10-CM | POA: Diagnosis not present

## 2015-06-03 DIAGNOSIS — M9901 Segmental and somatic dysfunction of cervical region: Secondary | ICD-10-CM | POA: Diagnosis not present

## 2015-06-03 DIAGNOSIS — M50222 Other cervical disc displacement at C5-C6 level: Secondary | ICD-10-CM | POA: Diagnosis not present

## 2015-06-03 DIAGNOSIS — M542 Cervicalgia: Secondary | ICD-10-CM | POA: Diagnosis not present

## 2015-06-03 DIAGNOSIS — M791 Myalgia: Secondary | ICD-10-CM | POA: Diagnosis not present

## 2015-06-03 DIAGNOSIS — M9903 Segmental and somatic dysfunction of lumbar region: Secondary | ICD-10-CM | POA: Diagnosis not present

## 2015-06-03 DIAGNOSIS — M461 Sacroiliitis, not elsewhere classified: Secondary | ICD-10-CM | POA: Diagnosis not present

## 2015-06-03 DIAGNOSIS — M50322 Other cervical disc degeneration at C5-C6 level: Secondary | ICD-10-CM | POA: Diagnosis not present

## 2015-06-03 DIAGNOSIS — M5137 Other intervertebral disc degeneration, lumbosacral region: Secondary | ICD-10-CM | POA: Diagnosis not present

## 2015-06-06 DIAGNOSIS — M5137 Other intervertebral disc degeneration, lumbosacral region: Secondary | ICD-10-CM | POA: Diagnosis not present

## 2015-06-06 DIAGNOSIS — M461 Sacroiliitis, not elsewhere classified: Secondary | ICD-10-CM | POA: Diagnosis not present

## 2015-06-06 DIAGNOSIS — M9903 Segmental and somatic dysfunction of lumbar region: Secondary | ICD-10-CM | POA: Diagnosis not present

## 2015-06-06 DIAGNOSIS — M791 Myalgia: Secondary | ICD-10-CM | POA: Diagnosis not present

## 2015-06-06 DIAGNOSIS — M50322 Other cervical disc degeneration at C5-C6 level: Secondary | ICD-10-CM | POA: Diagnosis not present

## 2015-06-06 DIAGNOSIS — M9901 Segmental and somatic dysfunction of cervical region: Secondary | ICD-10-CM | POA: Diagnosis not present

## 2015-06-06 DIAGNOSIS — M542 Cervicalgia: Secondary | ICD-10-CM | POA: Diagnosis not present

## 2015-06-06 DIAGNOSIS — M50222 Other cervical disc displacement at C5-C6 level: Secondary | ICD-10-CM | POA: Diagnosis not present

## 2015-06-07 DIAGNOSIS — M47816 Spondylosis without myelopathy or radiculopathy, lumbar region: Secondary | ICD-10-CM | POA: Diagnosis not present

## 2015-06-07 DIAGNOSIS — G8929 Other chronic pain: Secondary | ICD-10-CM | POA: Diagnosis not present

## 2015-06-07 DIAGNOSIS — Z79891 Long term (current) use of opiate analgesic: Secondary | ICD-10-CM | POA: Diagnosis not present

## 2015-06-07 DIAGNOSIS — M47814 Spondylosis without myelopathy or radiculopathy, thoracic region: Secondary | ICD-10-CM | POA: Diagnosis not present

## 2015-06-07 DIAGNOSIS — F329 Major depressive disorder, single episode, unspecified: Secondary | ICD-10-CM | POA: Diagnosis not present

## 2015-06-08 DIAGNOSIS — M9903 Segmental and somatic dysfunction of lumbar region: Secondary | ICD-10-CM | POA: Diagnosis not present

## 2015-06-08 DIAGNOSIS — M791 Myalgia: Secondary | ICD-10-CM | POA: Diagnosis not present

## 2015-06-08 DIAGNOSIS — M50222 Other cervical disc displacement at C5-C6 level: Secondary | ICD-10-CM | POA: Diagnosis not present

## 2015-06-08 DIAGNOSIS — M461 Sacroiliitis, not elsewhere classified: Secondary | ICD-10-CM | POA: Diagnosis not present

## 2015-06-08 DIAGNOSIS — M9901 Segmental and somatic dysfunction of cervical region: Secondary | ICD-10-CM | POA: Diagnosis not present

## 2015-06-08 DIAGNOSIS — M5137 Other intervertebral disc degeneration, lumbosacral region: Secondary | ICD-10-CM | POA: Diagnosis not present

## 2015-06-08 DIAGNOSIS — M50322 Other cervical disc degeneration at C5-C6 level: Secondary | ICD-10-CM | POA: Diagnosis not present

## 2015-06-08 DIAGNOSIS — M542 Cervicalgia: Secondary | ICD-10-CM | POA: Diagnosis not present

## 2015-06-10 DIAGNOSIS — M9903 Segmental and somatic dysfunction of lumbar region: Secondary | ICD-10-CM | POA: Diagnosis not present

## 2015-06-10 DIAGNOSIS — M9901 Segmental and somatic dysfunction of cervical region: Secondary | ICD-10-CM | POA: Diagnosis not present

## 2015-06-10 DIAGNOSIS — M50222 Other cervical disc displacement at C5-C6 level: Secondary | ICD-10-CM | POA: Diagnosis not present

## 2015-06-10 DIAGNOSIS — M542 Cervicalgia: Secondary | ICD-10-CM | POA: Diagnosis not present

## 2015-06-10 DIAGNOSIS — M5137 Other intervertebral disc degeneration, lumbosacral region: Secondary | ICD-10-CM | POA: Diagnosis not present

## 2015-06-10 DIAGNOSIS — M50322 Other cervical disc degeneration at C5-C6 level: Secondary | ICD-10-CM | POA: Diagnosis not present

## 2015-06-10 DIAGNOSIS — M791 Myalgia: Secondary | ICD-10-CM | POA: Diagnosis not present

## 2015-06-10 DIAGNOSIS — M461 Sacroiliitis, not elsewhere classified: Secondary | ICD-10-CM | POA: Diagnosis not present

## 2015-07-01 ENCOUNTER — Encounter: Payer: Self-pay | Admitting: Pulmonary Disease

## 2015-07-01 ENCOUNTER — Ambulatory Visit (INDEPENDENT_AMBULATORY_CARE_PROVIDER_SITE_OTHER): Payer: Medicare Other | Admitting: Pulmonary Disease

## 2015-07-01 VITALS — BP 110/74 | HR 84 | Ht 69.0 in | Wt 293.6 lb

## 2015-07-01 DIAGNOSIS — J301 Allergic rhinitis due to pollen: Secondary | ICD-10-CM

## 2015-07-01 DIAGNOSIS — G4733 Obstructive sleep apnea (adult) (pediatric): Secondary | ICD-10-CM | POA: Diagnosis not present

## 2015-07-01 DIAGNOSIS — Z6841 Body Mass Index (BMI) 40.0 and over, adult: Secondary | ICD-10-CM

## 2015-07-01 DIAGNOSIS — J45998 Other asthma: Secondary | ICD-10-CM
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
## 2015-07-01 NOTE — Patient Instructions (Signed)
Can try NeilMed sinus rinse for sinus congestion >> use once daily  Can try flonase or nasacort one spray in each nostril daily for allergies >> can get this over the counter  Follow up in 6 months

## 2015-07-18 DIAGNOSIS — M47814 Spondylosis without myelopathy or radiculopathy, thoracic region: Secondary | ICD-10-CM | POA: Diagnosis not present

## 2015-07-18 DIAGNOSIS — M47816 Spondylosis without myelopathy or radiculopathy, lumbar region: Secondary | ICD-10-CM | POA: Diagnosis not present

## 2015-07-18 DIAGNOSIS — Z79891 Long term (current) use of opiate analgesic: Secondary | ICD-10-CM | POA: Diagnosis not present

## 2015-07-18 DIAGNOSIS — G8929 Other chronic pain: Secondary | ICD-10-CM | POA: Diagnosis not present

## 2015-07-28 DIAGNOSIS — H5213 Myopia, bilateral: Secondary | ICD-10-CM | POA: Diagnosis not present

## 2015-07-28 DIAGNOSIS — H52203 Unspecified astigmatism, bilateral: Secondary | ICD-10-CM | POA: Diagnosis not present

## 2015-08-01 DIAGNOSIS — R1032 Left lower quadrant pain: Secondary | ICD-10-CM | POA: Diagnosis not present

## 2015-08-11 DIAGNOSIS — R1030 Lower abdominal pain, unspecified: Secondary | ICD-10-CM | POA: Diagnosis not present

## 2015-08-12 ENCOUNTER — Other Ambulatory Visit: Payer: Self-pay | Admitting: General Surgery

## 2015-08-12 DIAGNOSIS — R1032 Left lower quadrant pain: Secondary | ICD-10-CM

## 2015-08-17 ENCOUNTER — Ambulatory Visit
Admission: RE | Admit: 2015-08-17 | Discharge: 2015-08-17 | Disposition: A | Discharge status: Home / Self Care | Payer: Medicare Other | Source: Ambulatory Visit | Attending: General Surgery | Admitting: General Surgery

## 2015-08-17 DIAGNOSIS — R1032 Left lower quadrant pain: Secondary | ICD-10-CM

## 2015-08-17 DIAGNOSIS — N200 Calculus of kidney: Secondary | ICD-10-CM | POA: Diagnosis not present

## 2015-08-17 MED ORDER — IOPAMIDOL (ISOVUE-300) INJECTION 61%
125.0000 mL | Freq: Once | INTRAVENOUS | Status: AC | PRN
  Administered 2015-08-17: 125 mL via INTRAVENOUS

## 2015-08-24 DIAGNOSIS — R109 Unspecified abdominal pain: Secondary | ICD-10-CM | POA: Diagnosis not present

## 2015-08-25 DIAGNOSIS — M47816 Spondylosis without myelopathy or radiculopathy, lumbar region: Secondary | ICD-10-CM | POA: Diagnosis not present

## 2015-08-25 DIAGNOSIS — G8929 Other chronic pain: Secondary | ICD-10-CM | POA: Diagnosis not present

## 2015-08-25 DIAGNOSIS — Z79891 Long term (current) use of opiate analgesic: Secondary | ICD-10-CM | POA: Diagnosis not present

## 2015-09-14 ENCOUNTER — Telehealth: Payer: Self-pay | Admitting: Gastroenterology

## 2015-09-14 NOTE — Telephone Encounter (Signed)
Received referral from [facility]. Marda Stalker, PA would like patient to be seen for 2nd opinion for abd pain. Records placed on Dr. Eugenia Pancoast desk for review.

## 2015-09-19 ENCOUNTER — Encounter: Payer: Self-pay | Admitting: Gastroenterology

## 2015-09-19 DIAGNOSIS — H00012 Hordeolum externum right lower eyelid: Secondary | ICD-10-CM | POA: Diagnosis not present

## 2015-09-19 NOTE — Telephone Encounter (Signed)
Dr. Eugenia Pancoast reviewed records and has accepted patient. Ok to schedule next available new patient appointment. Per Dr. Eugenia Pancoast "do not double book and do not book w/extender". Left message for patient to return my call.

## 2015-10-11 DIAGNOSIS — G8929 Other chronic pain: Secondary | ICD-10-CM | POA: Diagnosis not present

## 2015-10-11 DIAGNOSIS — M47816 Spondylosis without myelopathy or radiculopathy, lumbar region: Secondary | ICD-10-CM | POA: Diagnosis not present

## 2015-10-11 DIAGNOSIS — Z79891 Long term (current) use of opiate analgesic: Secondary | ICD-10-CM | POA: Diagnosis not present

## 2015-11-11 DIAGNOSIS — M791 Myalgia: Secondary | ICD-10-CM | POA: Diagnosis not present

## 2015-11-11 DIAGNOSIS — M461 Sacroiliitis, not elsewhere classified: Secondary | ICD-10-CM | POA: Diagnosis not present

## 2015-11-11 DIAGNOSIS — M25552 Pain in left hip: Secondary | ICD-10-CM | POA: Diagnosis not present

## 2015-11-11 DIAGNOSIS — M5124 Other intervertebral disc displacement, thoracic region: Secondary | ICD-10-CM | POA: Diagnosis not present

## 2015-11-11 DIAGNOSIS — M546 Pain in thoracic spine: Secondary | ICD-10-CM | POA: Diagnosis not present

## 2015-11-11 DIAGNOSIS — M5136 Other intervertebral disc degeneration, lumbar region: Secondary | ICD-10-CM | POA: Diagnosis not present

## 2015-11-11 DIAGNOSIS — M542 Cervicalgia: Secondary | ICD-10-CM | POA: Diagnosis not present

## 2015-11-11 DIAGNOSIS — M25551 Pain in right hip: Secondary | ICD-10-CM | POA: Diagnosis not present

## 2015-11-11 DIAGNOSIS — M545 Low back pain: Secondary | ICD-10-CM | POA: Diagnosis not present

## 2015-11-15 DIAGNOSIS — M461 Sacroiliitis, not elsewhere classified: Secondary | ICD-10-CM | POA: Diagnosis not present

## 2015-11-15 DIAGNOSIS — M25552 Pain in left hip: Secondary | ICD-10-CM | POA: Diagnosis not present

## 2015-11-15 DIAGNOSIS — M791 Myalgia: Secondary | ICD-10-CM | POA: Diagnosis not present

## 2015-11-15 DIAGNOSIS — M542 Cervicalgia: Secondary | ICD-10-CM | POA: Diagnosis not present

## 2015-11-15 DIAGNOSIS — M5124 Other intervertebral disc displacement, thoracic region: Secondary | ICD-10-CM | POA: Diagnosis not present

## 2015-11-15 DIAGNOSIS — M5136 Other intervertebral disc degeneration, lumbar region: Secondary | ICD-10-CM | POA: Diagnosis not present

## 2015-11-15 DIAGNOSIS — M545 Low back pain: Secondary | ICD-10-CM | POA: Diagnosis not present

## 2015-11-15 DIAGNOSIS — M25551 Pain in right hip: Secondary | ICD-10-CM | POA: Diagnosis not present

## 2015-11-15 DIAGNOSIS — M546 Pain in thoracic spine: Secondary | ICD-10-CM | POA: Diagnosis not present

## 2015-11-18 ENCOUNTER — Ambulatory Visit (INDEPENDENT_AMBULATORY_CARE_PROVIDER_SITE_OTHER): Payer: Medicare Other | Admitting: Gastroenterology

## 2015-11-18 ENCOUNTER — Encounter (INDEPENDENT_AMBULATORY_CARE_PROVIDER_SITE_OTHER): Payer: Self-pay

## 2015-11-18 ENCOUNTER — Encounter: Payer: Self-pay | Admitting: Gastroenterology

## 2015-11-18 VITALS — BP 104/70 | HR 68 | Ht 69.0 in | Wt 287.2 lb

## 2015-11-18 DIAGNOSIS — K921 Melena: Secondary | ICD-10-CM

## 2015-11-18 DIAGNOSIS — Z800 Family history of malignant neoplasm of digestive organs: Secondary | ICD-10-CM

## 2015-11-18 DIAGNOSIS — R1030 Lower abdominal pain, unspecified: Secondary | ICD-10-CM

## 2015-11-18 DIAGNOSIS — R1032 Left lower quadrant pain: Secondary | ICD-10-CM

## 2015-11-18 MED ORDER — NA SULFATE-K SULFATE-MG SULF 17.5-3.13-1.6 GM/177ML PO SOLN: 1.0000 | Freq: Once | ORAL | 0 refills | Status: AC
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
## 2015-11-18 NOTE — Patient Instructions (Signed)
You left groin pain and periumbilical pain is NOT from a GI problem.  It is most likely a hernia that is not big enough to detect yet. You will be set up for a colonoscopy for blood in stool (overt and microscopic; FH of colon cancer).

## 2015-11-23 DIAGNOSIS — M461 Sacroiliitis, not elsewhere classified: Secondary | ICD-10-CM | POA: Diagnosis not present

## 2015-11-23 DIAGNOSIS — M791 Myalgia: Secondary | ICD-10-CM | POA: Diagnosis not present

## 2015-11-24 DIAGNOSIS — R5383 Other fatigue: Secondary | ICD-10-CM | POA: Diagnosis not present

## 2015-11-28 ENCOUNTER — Encounter (HOSPITAL_BASED_OUTPATIENT_CLINIC_OR_DEPARTMENT_OTHER): Payer: Self-pay | Admitting: *Deleted

## 2015-11-28 ENCOUNTER — Emergency Department (HOSPITAL_BASED_OUTPATIENT_CLINIC_OR_DEPARTMENT_OTHER)
Admission: EM | Admit: 2015-11-28 | Discharge: 2015-11-28 | Disposition: A | Discharge status: Home / Self Care | Payer: Medicare Other | Attending: Emergency Medicine | Admitting: Emergency Medicine

## 2015-11-28 ENCOUNTER — Emergency Department (HOSPITAL_BASED_OUTPATIENT_CLINIC_OR_DEPARTMENT_OTHER): Payer: Medicare Other

## 2015-11-28 DIAGNOSIS — R5383 Other fatigue: Secondary | ICD-10-CM

## 2015-11-28 DIAGNOSIS — J45909 Unspecified asthma, uncomplicated: Secondary | ICD-10-CM | POA: Insufficient documentation

## 2015-11-28 DIAGNOSIS — R05 Cough: Secondary | ICD-10-CM | POA: Diagnosis not present

## 2015-11-28 DIAGNOSIS — Z79899 Other long term (current) drug therapy: Secondary | ICD-10-CM | POA: Insufficient documentation

## 2015-11-28 DIAGNOSIS — R69 Illness, unspecified: Secondary | ICD-10-CM | POA: Diagnosis present

## 2015-11-28 LAB — CBC WITH DIFFERENTIAL/PLATELET
Basophils Absolute: 0 10*3/uL (ref 0.0–0.1)
Basophils Relative: 0 %
Eosinophils Absolute: 0.2 10*3/uL (ref 0.0–0.7)
Eosinophils Relative: 5 %
HCT: 45.2 % (ref 39.0–52.0)
Hemoglobin: 15.5 g/dL (ref 13.0–17.0)
Lymphocytes Relative: 34 %
Lymphs Abs: 1.2 10*3/uL (ref 0.7–4.0)
MCH: 28.5 pg (ref 26.0–34.0)
MCHC: 34.3 g/dL (ref 30.0–36.0)
MCV: 83.1 fL (ref 78.0–100.0)
Monocytes Absolute: 0.5 10*3/uL (ref 0.1–1.0)
Monocytes Relative: 13 %
Neutro Abs: 1.7 10*3/uL (ref 1.7–7.7)
Neutrophils Relative %: 48 %
Platelets: 144 10*3/uL — ABNORMAL LOW (ref 150–400)
RBC: 5.44 MIL/uL (ref 4.22–5.81)
RDW: 14.1 % (ref 11.5–15.5)
WBC: 3.6 10*3/uL — ABNORMAL LOW (ref 4.0–10.5)

## 2015-11-28 LAB — COMPREHENSIVE METABOLIC PANEL
ALT: 23 U/L (ref 17–63)
AST: 22 U/L (ref 15–41)
Albumin: 4.4 g/dL (ref 3.5–5.0)
Alkaline Phosphatase: 58 U/L (ref 38–126)
Anion gap: 7 (ref 5–15)
BUN: 9 mg/dL (ref 6–20)
CO2: 26 mmol/L (ref 22–32)
Calcium: 8.9 mg/dL (ref 8.9–10.3)
Chloride: 105 mmol/L (ref 101–111)
Creatinine, Ser: 0.95 mg/dL (ref 0.61–1.24)
GFR calc Af Amer: >60 mL/min (ref >60)
GFR calc non Af Amer: >60 mL/min (ref >60)
Glucose, Bld: 88 mg/dL (ref 65–99)
Potassium: 3.8 mmol/L (ref 3.5–5.1)
Sodium: 138 mmol/L (ref 135–145)
Total Bilirubin: 0.4 mg/dL (ref 0.3–1.2)
Total Protein: 7.8 g/dL (ref 6.5–8.1)

## 2015-11-28 LAB — URINALYSIS, ROUTINE W REFLEX MICROSCOPIC
Bilirubin Urine: NEGATIVE
Glucose, UA: NEGATIVE mg/dL
Hgb urine dipstick: NEGATIVE
Ketones, ur: NEGATIVE mg/dL
Leukocytes, UA: NEGATIVE
Nitrite: NEGATIVE
Protein, ur: NEGATIVE mg/dL
Specific Gravity, Urine: 1.013 (ref 1.005–1.030)
pH: 6.5 (ref 5.0–8.0)

## 2015-11-28 LAB — TROPONIN I: Troponin I: <0.03 ng/mL (ref <0.03)

## 2015-11-28 LAB — T4, FREE: Free T4: 0.9 ng/dL (ref 0.61–1.12)

## 2015-11-28 LAB — OCCULT BLOOD X 1 CARD TO LAB, STOOL: Fecal Occult Bld: NEGATIVE

## 2015-11-28 LAB — LIPASE, BLOOD: Lipase: 26 U/L (ref 11–51)

## 2015-11-28 NOTE — ED Triage Notes (Signed)
Pt here for feeling generally unwell for 2 weeks.  Pt describes lack of appetite with low grade nausea x2 weeks, feeling generally weak and faint at times.  Pt has chronic back pain and sees a pain clinic and is on oxycodone and skelaxin.  No fever chills with this.  No change in pain meds

## 2015-11-28 NOTE — Discharge Instructions (Signed)
Please continue to follow-up with your PCP for ongoing work-up of your weakness and fatigue. Return for worsening symptoms, including passing out, fever, escalating pain or any other symptoms concerning to you.  We encourage you to get colonoscopy and upper endoscopy as scheduled with your GI doctor.

## 2015-11-28 NOTE — ED Notes (Signed)
Patient is resting comfortably. Updated on status.

## 2015-11-28 NOTE — ED Notes (Signed)
Water given to drink per pt request/MD ok.
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
## 2015-11-29 DIAGNOSIS — R5383 Other fatigue: Secondary | ICD-10-CM | POA: Diagnosis not present

## 2015-11-29 DIAGNOSIS — G4733 Obstructive sleep apnea (adult) (pediatric): Secondary | ICD-10-CM | POA: Diagnosis not present

## 2015-11-29 DIAGNOSIS — M545 Low back pain: Secondary | ICD-10-CM | POA: Diagnosis not present

## 2015-11-29 LAB — TSH: TSH: 1.014 u[IU]/mL (ref 0.350–4.500)

## 2015-11-30 ENCOUNTER — Telehealth: Payer: Self-pay | Admitting: Pulmonary Disease

## 2015-11-30 DIAGNOSIS — M791 Myalgia: Secondary | ICD-10-CM | POA: Diagnosis not present

## 2015-11-30 DIAGNOSIS — M546 Pain in thoracic spine: Secondary | ICD-10-CM | POA: Diagnosis not present

## 2015-11-30 NOTE — Telephone Encounter (Signed)
Spoke with pt, c/o increased fatigue, lack of appetite X2-3 weeks.  Pt was seen in ED X2 days ago for fatigue.  Pt then followed up with PCP, who advised pt to follow up with his sleep doc to ensure that this problem is not coming from his cpap.  Pt scheduled to see VS on Monday.  Pt's download is available in [place].  Nothing further needed at this time.

## 2015-12-05 ENCOUNTER — Ambulatory Visit (INDEPENDENT_AMBULATORY_CARE_PROVIDER_SITE_OTHER): Payer: Medicare Other | Admitting: Pulmonary Disease

## 2015-12-05 ENCOUNTER — Encounter: Payer: Self-pay | Admitting: Pulmonary Disease

## 2015-12-05 VITALS — BP 128/80 | HR 66 | Ht 69.0 in | Wt 289.4 lb

## 2015-12-05 DIAGNOSIS — J301 Allergic rhinitis due to pollen: Secondary | ICD-10-CM | POA: Diagnosis not present

## 2015-12-05 DIAGNOSIS — J45901 Unspecified asthma with (acute) exacerbation: Secondary | ICD-10-CM | POA: Diagnosis not present

## 2015-12-05 DIAGNOSIS — J45998 Other asthma: Secondary | ICD-10-CM | POA: Diagnosis not present

## 2015-12-05 DIAGNOSIS — G4733 Obstructive sleep apnea (adult) (pediatric): Secondary | ICD-10-CM | POA: Diagnosis not present

## 2015-12-05 DIAGNOSIS — Z6841 Body Mass Index (BMI) 40.0 and over, adult: Secondary | ICD-10-CM

## 2015-12-05 LAB — NITRIC OXIDE: Nitric Oxide: 26

## 2015-12-05 MED ORDER — PREDNISONE 10 MG PO TABS: ORAL_TABLET | ORAL | 0 refills | Status: DC

## 2015-12-05 MED ORDER — FLUTICASONE PROPIONATE 50 MCG/ACT NA SUSP: 1.0000 | Freq: Every day | NASAL | 2 refills | Status: DC

## 2015-12-05 MED ORDER — MOMETASONE FURO-FORMOTEROL FUM 100-5 MCG/ACT IN AERO: 2.0000 | INHALATION_SPRAY | Freq: Two times a day (BID) | RESPIRATORY_TRACT | 0 refills | Status: DC

## 2015-12-05 NOTE — Addendum Note (Signed)
Addended by: Mathis Bud on: 12/05/2015 12:30 PM   Modules accepted: Orders

## 2015-12-05 NOTE — Addendum Note (Signed)
Addended by: Virl Cagey on: 12/05/2015 12:44 PM   Modules accepted: Orders
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
## 2015-12-05 NOTE — Patient Instructions (Addendum)
Prednisone 10 mg pill >> 3 pills daily for 2 days, 2 pills daily for 2 days, 1 pill daily for 2 days  Flonase 1 spray each nostril daily  Call if symptoms do not improve  Follow up in 6 months

## 2015-12-07 DIAGNOSIS — M791 Myalgia: Secondary | ICD-10-CM | POA: Diagnosis not present

## 2015-12-07 DIAGNOSIS — M461 Sacroiliitis, not elsewhere classified: Secondary | ICD-10-CM | POA: Diagnosis not present

## 2015-12-13 DIAGNOSIS — E559 Vitamin D deficiency, unspecified: Secondary | ICD-10-CM | POA: Diagnosis not present

## 2015-12-13 DIAGNOSIS — G894 Chronic pain syndrome: Secondary | ICD-10-CM | POA: Diagnosis not present

## 2015-12-13 DIAGNOSIS — Z23 Encounter for immunization: Secondary | ICD-10-CM | POA: Diagnosis not present

## 2015-12-13 DIAGNOSIS — M5136 Other intervertebral disc degeneration, lumbar region: Secondary | ICD-10-CM | POA: Diagnosis not present

## 2015-12-14 ENCOUNTER — Telehealth: Payer: Self-pay | Admitting: Pulmonary Disease

## 2015-12-14 DIAGNOSIS — M5136 Other intervertebral disc degeneration, lumbar region: Secondary | ICD-10-CM | POA: Diagnosis not present

## 2015-12-14 DIAGNOSIS — M461 Sacroiliitis, not elsewhere classified: Secondary | ICD-10-CM | POA: Diagnosis not present

## 2015-12-14 DIAGNOSIS — M791 Myalgia: Secondary | ICD-10-CM | POA: Diagnosis not present

## 2015-12-14 DIAGNOSIS — M5124 Other intervertebral disc displacement, thoracic region: Secondary | ICD-10-CM | POA: Diagnosis not present

## 2015-12-14 DIAGNOSIS — M546 Pain in thoracic spine: Secondary | ICD-10-CM | POA: Diagnosis not present

## 2015-12-14 NOTE — Telephone Encounter (Signed)
VS please advise if you would refill the tussionex for the pt.  He felt that you meant to refill this at his last OV on 9/25.  Please advise. Thanks  No Known Allergies

## 2015-12-16 NOTE — Telephone Encounter (Signed)
VS has been on night float this week Pt is a Medicare pt, will send to VS for the approval/denial for the Tussionex and TP will need to sign Rx if okayed Per 9.25.17 ov w/ VS: Instructions     Return in about 6 months (around 06/03/2016).  Prednisone 10 mg pill >> 3 pills daily for 2 days, 2 pills daily for 2 days, 1 pill daily for 2 days   Flonase 1 spray each nostril daily   Call if symptoms do not improve   Follow up in 6 months

## 2015-12-18 NOTE — Telephone Encounter (Signed)
Okay to send refill. 

## 2015-12-19 MED ORDER — HYDROCOD POLST-CPM POLST ER 10-8 MG/5ML PO SUER: 5.0000 mL | Freq: Every evening | ORAL | 0 refills | Status: DC | PRN

## 2015-12-19 NOTE — Telephone Encounter (Signed)
Spoke with pt and advised that rx will be ready for pick up this afternoon at the front desk. Nothing further needed.

## 2015-12-21 DIAGNOSIS — M791 Myalgia: Secondary | ICD-10-CM | POA: Diagnosis not present

## 2015-12-21 DIAGNOSIS — M461 Sacroiliitis, not elsewhere classified: Secondary | ICD-10-CM | POA: Diagnosis not present

## 2015-12-23 ENCOUNTER — Encounter: Payer: Self-pay | Admitting: Gastroenterology

## 2015-12-23 ENCOUNTER — Ambulatory Visit (AMBULATORY_SURGERY_CENTER): Payer: Medicare Other | Admitting: Gastroenterology

## 2015-12-23 VITALS — BP 122/74 | HR 68 | Temp 97.7°F | Resp 23 | Ht 69.0 in | Wt 289.0 lb

## 2015-12-23 DIAGNOSIS — K635 Polyp of colon: Secondary | ICD-10-CM

## 2015-12-23 DIAGNOSIS — F4310 Post-traumatic stress disorder, unspecified: Secondary | ICD-10-CM | POA: Diagnosis not present

## 2015-12-23 DIAGNOSIS — Z800 Family history of malignant neoplasm of digestive organs: Secondary | ICD-10-CM

## 2015-12-23 DIAGNOSIS — K648 Other hemorrhoids: Secondary | ICD-10-CM | POA: Diagnosis not present

## 2015-12-23 DIAGNOSIS — D124 Benign neoplasm of descending colon: Secondary | ICD-10-CM

## 2015-12-23 DIAGNOSIS — G4733 Obstructive sleep apnea (adult) (pediatric): Secondary | ICD-10-CM | POA: Diagnosis not present

## 2015-12-23 DIAGNOSIS — K921 Melena: Secondary | ICD-10-CM | POA: Diagnosis not present

## 2015-12-23 DIAGNOSIS — J45909 Unspecified asthma, uncomplicated: Secondary | ICD-10-CM | POA: Diagnosis not present

## 2015-12-23 MED ORDER — SODIUM CHLORIDE 0.9 % IV SOLN: 500.0000 mL | INTRAVENOUS | Status: DC

## 2015-12-23 NOTE — Progress Notes (Signed)
Report to PACU, RN, vss, BBS= Clear.  

## 2015-12-23 NOTE — Progress Notes (Signed)
Called to room to assist during endoscopic procedure.  Patient ID and intended procedure confirmed with present staff. Received instructions for my participation in the procedure from the performing physician.  

## 2015-12-23 NOTE — Op Note (Signed)
[facility] Patient Name: [name] Procedure Date: 12/23/2015 3:47 PM MRN: [id number] Endoscopist: Milus Banister , MD Age: 50 Referring MD:  Date of Birth: 04-06-1965 Gender: Male Account #: [id number] Procedure:                Colonoscopy Indications:              Heme positive stool, father had colon cancer;                            colonoscopy 2015 Dr. Watt Climes: no polyps Medicines:                Monitored Anesthesia Care Procedure:                Pre-Anesthesia Assessment:                           - Prior to the procedure, a History and Physical                            was performed, and patient medications and                            allergies were reviewed. The patient's tolerance of                            previous anesthesia was also reviewed. The risks                            and benefits of the procedure and the sedation                            options and risks were discussed with the patient.                            All questions were answered, and informed consent                            was obtained. Prior Anticoagulants: The patient has                            taken no previous anticoagulant or antiplatelet                            agents. ASA Grade Assessment: II - A patient with                            mild systemic disease. After reviewing the risks                            and benefits, the patient was deemed in                            satisfactory condition to undergo the procedure.  After obtaining informed consent, the colonoscope                            was passed under direct vision. Throughout the                            procedure, the patient's blood pressure, pulse, and                            oxygen saturations were monitored continuously. The                            Model CF-HQ190L [id number]) scope was introduced                            through the anus and  advanced to the the cecum,                            identified by appendiceal orifice and ileocecal                            valve. The colonoscopy was performed without                            difficulty. The patient tolerated the procedure                            well. The quality of the bowel preparation was                            good. The ileocecal valve, appendiceal orifice, and                            rectum were photographed. Scope In: 3:53:19 PM Scope Out: 4:01:16 PM Scope Withdrawal Time: 0 hours 6 minutes 15 seconds  Total Procedure Duration: 0 hours 7 minutes 57 seconds  Findings:                 A 3 mm polyp was found in the descending colon. The                            polyp was sessile. The polyp was removed with a                            cold snare. Resection and retrieval were complete.                           Internal hemorrhoids were found. The hemorrhoids                            were medium-sized.                           The exam was otherwise without abnormality on  direct and retroflexion views. Complications:            No immediate complications. Estimated blood loss:                            None. Estimated Blood Loss:     Estimated blood loss: none. Impression:               - One 3 mm polyp in the descending colon, removed                            with a cold snare. Resected and retrieved.                           - Internal hemorrhoids.                           - The examination was otherwise normal on direct                            and retroflexion views. Recommendation:           - Patient has a contact number available for                            emergencies. The signs and symptoms of potential                            delayed complications were discussed with the                            patient. Return to normal activities tomorrow.                            Written discharge  instructions were provided to the                            patient.                           - Resume previous diet.                           - Continue present medications.                           You will receive a letter within 2-3 weeks with the                            pathology results and my final recommendations.                           If the polyp(s) is proven to be 'pre-cancerous' on                            pathology, you will need repeat colonoscopy in 5  years. Milus Banister, MD 12/23/2015 4:04:38 PM This report has been signed electronically.

## 2015-12-23 NOTE — Patient Instructions (Signed)
YOU HAD AN ENDOSCOPIC PROCEDURE TODAY AT THE  ENDOSCOPY CENTER:   Refer to the procedure report that was given to you for any specific questions about what was found during the examination.  If the procedure report does not answer your questions, please call your gastroenterologist to clarify.  If you requested that your care partner not be given the details of your procedure findings, then the procedure report has been included in a sealed envelope for you to review at your convenience later.  YOU SHOULD EXPECT: Some feelings of bloating in the abdomen. Passage of more gas than usual.  Walking can help get rid of the air that was put into your GI tract during the procedure and reduce the bloating. If you had a lower endoscopy (such as a colonoscopy or flexible sigmoidoscopy) you may notice spotting of blood in your stool or on the toilet paper. If you underwent a bowel prep for your procedure, you may not have a normal bowel movement for a few days.  Please Note:  You might notice some irritation and congestion in your nose or some drainage.  This is from the oxygen used during your procedure.  There is no need for concern and it should clear up in a day or so.  SYMPTOMS TO REPORT IMMEDIATELY:   Following lower endoscopy (colonoscopy or flexible sigmoidoscopy):  Excessive amounts of blood in the stool  Significant tenderness or worsening of abdominal pains  Swelling of the abdomen that is new, acute  Fever of 100F or higher   For urgent or emergent issues, a gastroenterologist can be reached at any hour by calling [phone].   DIET:  We do recommend a small meal at first, but then you may proceed to your regular diet.  Drink plenty of fluids but you should avoid alcoholic beverages for 24 hours.  ACTIVITY:  You should plan to take it easy for the rest of today and you should NOT DRIVE or use heavy machinery until tomorrow (because of the sedation medicines used during the test).     FOLLOW UP: Our staff will call the number listed on your records the next business day following your procedure to check on you and address any questions or concerns that you may have regarding the information given to you following your procedure. If we do not reach you, we will leave a message.  However, if you are feeling well and you are not experiencing any problems, there is no need to return our call.  We will assume that you have returned to your regular daily activities without incident.  If any biopsies were taken you will be contacted by phone or by letter within the next 1-3 weeks.  Please call us at [phone] if you have not heard about the biopsies in 3 weeks.    SIGNATURES/CONFIDENTIALITY: You and/or your care partner have signed paperwork which will be entered into your electronic medical record.  These signatures attest to the fact that that the information above on your After Visit Summary has been reviewed and is understood.  Full responsibility of the confidentiality of this discharge information lies with you and/or your care-partner.  Read all of the handouts given to you by your recovery room nurse. Thank-you for choosing us for your healthcare needs today. 

## 2015-12-26 ENCOUNTER — Telehealth: Payer: Self-pay

## 2015-12-26 NOTE — Telephone Encounter (Signed)
  Follow up Call-  Call back number 12/23/2015  Post procedure Call Back phone  # [phone]  Permission to leave phone message Yes  Some recent data might be hidden     Patient was called for follow up after his procedure on 12/23/2015. Patient reports that he is still experiencing LLQ abdominal discomfort, and it is the same discomfort that he had prior to the colonoscopy. I instructed the patient to go to the ER if he felt like the discomfort was significant, or if the discomfort continued he could call the office in the morning to see if he could be seen. The patient was also worried that he could have colon cancer and did not understand that the polyps were gone so he need not worry. I told the patient that he would receive a letter that would give him the recommendations as to when he needed to repeat the colonoscopy. He seemed to feel much better after speaking with him. Patient did not experience difficulty from his procedure.

## 2015-12-26 NOTE — Telephone Encounter (Signed)
  Follow up Call-  Call back number 12/23/2015  Post procedure Call Back phone  # [phone]  Permission to leave phone message Yes  Some recent data might be hidden    Patient was called for follow up after his procedure on 12/23/2015. No answer at the number given for follow up phone call. A message was left on the answering machine.

## 2015-12-27 DIAGNOSIS — M47816 Spondylosis without myelopathy or radiculopathy, lumbar region: Secondary | ICD-10-CM | POA: Diagnosis not present

## 2015-12-27 DIAGNOSIS — G8929 Other chronic pain: Secondary | ICD-10-CM | POA: Diagnosis not present

## 2015-12-27 DIAGNOSIS — Z79891 Long term (current) use of opiate analgesic: Secondary | ICD-10-CM | POA: Diagnosis not present

## 2015-12-28 ENCOUNTER — Telehealth: Payer: Self-pay | Admitting: Gastroenterology

## 2015-12-28 NOTE — Telephone Encounter (Signed)
Left message on machine to call back  

## 2015-12-29 ENCOUNTER — Encounter: Payer: Self-pay | Admitting: Internal Medicine

## 2015-12-29 ENCOUNTER — Ambulatory Visit (INDEPENDENT_AMBULATORY_CARE_PROVIDER_SITE_OTHER): Payer: Medicare Other | Admitting: Internal Medicine

## 2015-12-29 VITALS — BP 108/78 | HR 76 | Ht 69.0 in | Wt 294.0 lb

## 2015-12-29 DIAGNOSIS — R05 Cough: Secondary | ICD-10-CM | POA: Diagnosis not present

## 2015-12-29 DIAGNOSIS — R059 Cough, unspecified: Secondary | ICD-10-CM

## 2015-12-29 DIAGNOSIS — J45998 Other asthma: Secondary | ICD-10-CM | POA: Diagnosis not present

## 2015-12-29 MED ORDER — MOMETASONE FURO-FORMOTEROL FUM 100-5 MCG/ACT IN AERO: 2.0000 | INHALATION_SPRAY | Freq: Two times a day (BID) | RESPIRATORY_TRACT | 0 refills | Status: DC

## 2015-12-29 NOTE — Telephone Encounter (Signed)
Left message on machine to call back  

## 2015-12-29 NOTE — Patient Instructions (Addendum)
Work on inhaler technique:  relax and gently blow all the way out then take a nice smooth deep breath back in, triggering the inhaler at same time you start breathing in.  Hold for up to 5 seconds if you can. Blow out thru nose. Rinse and gargle with water when done     Take tussionex up to every 12 hours x 3 days  For drainage / throat tickle try take CHLORPHENIRAMINE  4 mg - take one every 4 hours as needed - available over the counter- may cause drowsiness so start with just a bedtime dose or two and see how you tolerate it before trying in daytime    Try prilosec otc 20mg   Take 30-60 min before first meal of the day and Pepcid ac (famotidine) 20 mg one @  bedtime until cough is completely gone for at least a week without the need for cough suppression  GERD (REFLUX)  is an extremely common cause of respiratory symptoms just like yours , many times with no obvious heartburn at all.    It can be treated with medication, but also with lifestyle changes including elevation of the head of your bed (ideally with 6 inch  bed blocks),  Smoking cessation, avoidance of late meals, excessive alcohol, and avoid fatty foods, chocolate, peppermint, colas, red wine, and acidic juices such as orange juice.  NO MINT OR MENTHOL PRODUCTS SO NO COUGH DROPS  USE SUGARLESS CANDY INSTEAD (Jolley ranchers or Stover's or Life Savers) or even ice chips will also do - the key is to swallow to prevent all throat clearing. NO OIL BASED VITAMINS - use powdered substitutes.  Follow up with Dr Halford Chessman,   Lynelle Smoke NP or me if not better in 2 weeks

## 2015-12-29 NOTE — Assessment & Plan Note (Signed)
FENO 12/05/15 while on dulera 100 2bid = 26 with  active cough  - 12/29/2015  After extensive coaching HFA effectiveness =    90%   Doubt this is an asthma cough>  All goals of chronic asthma control met including optimal function and elimination of symptoms with minimal need for rescue therapy.  Contingencies discussed in full including contacting this office immediately if not controlling the symptoms using the rule of two's.
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
## 2015-12-29 NOTE — Assessment & Plan Note (Signed)
Cough appears typical of Upper airway cough syndrome (previously labeled PNDS) , is  so named because it's frequently impossible to sort out how much is  CR/sinusitis with freq throat clearing (which can be related to primary GERD)   vs  causing  secondary (" extra esophageal")  GERD from wide swings in gastric pressure that occur with throat clearing, often  promoting self use of mint and menthol lozenges that reduce the lower esophageal sphincter tone and exacerbate the problem further in a cyclical fashion.   These are the same pts (now being labeled as having "irritable larynx syndrome" by some cough centers) who not infrequently have a history of having failed to tolerate ace inhibitors,  dry powder inhalers or biphosphonates or report having atypical/extraesophageal reflux symptoms that don't respond to standard doses of PPI  and are easily confused as having aecopd or asthma flares by even experienced allergists/ pulmonologists (myself included).  Of the three most common causes of chronic cough, only one (GERD)  can actually cause the other two (asthma and post nasal drip syndrome)  and perpetuate the cylce of cough inducing airway trauma, inflammation, heightened sensitivity to reflux which is prompted by the cough itself via a cyclical mechanism.    This may partially respond to steroids and look like asthma and post nasal drainage but never erradicated completely unless the cough and the secondary reflux are eliminated, preferably both at the same time.  While not intuitively obvious, many patients with chronic low grade reflux do not cough until there is a secondary insult that disturbs the protective epithelial barrier and exposes sensitive nerve endings.  This can be viral or direct physical injury such as with an endotracheal tube.   The point is that once this occurs, it is difficult to eliminate using anything but a maximally effective acid suppression regimen at least in the short run,  accompanied by an appropriate diet to address non acid GERD.   rec max gerd rx/ trial of 1st gen H1 and f/u with Dr Halford Chessman if not better.

## 2015-12-29 NOTE — Assessment & Plan Note (Signed)
Body mass index is 43.42 kg/m.  Lab Results  Component Value Date   TSH 1.014 11/28/2015     Contributing to gerd tendency/ doe/reviewed the need and the process to achieve and maintain neg calorie balance > defer f/u primary care including intermittently monitoring thyroid status

## 2015-12-30 DIAGNOSIS — M546 Pain in thoracic spine: Secondary | ICD-10-CM | POA: Diagnosis not present

## 2015-12-30 DIAGNOSIS — M791 Myalgia: Secondary | ICD-10-CM | POA: Diagnosis not present

## 2015-12-30 NOTE — Telephone Encounter (Signed)
Message left for pt to return call x3, I will wait for further communication from the pt

## 2016-01-02 ENCOUNTER — Encounter: Payer: Self-pay | Admitting: Gastroenterology

## 2016-01-04 DIAGNOSIS — M791 Myalgia: Secondary | ICD-10-CM | POA: Diagnosis not present

## 2016-01-04 DIAGNOSIS — M461 Sacroiliitis, not elsewhere classified: Secondary | ICD-10-CM | POA: Diagnosis not present

## 2016-01-11 DIAGNOSIS — M461 Sacroiliitis, not elsewhere classified: Secondary | ICD-10-CM | POA: Diagnosis not present

## 2016-01-11 DIAGNOSIS — M791 Myalgia: Secondary | ICD-10-CM | POA: Diagnosis not present

## 2016-01-12 ENCOUNTER — Encounter: Payer: Self-pay | Admitting: Physician Assistant

## 2016-01-13 ENCOUNTER — Encounter (INDEPENDENT_AMBULATORY_CARE_PROVIDER_SITE_OTHER): Payer: Self-pay

## 2016-01-13 ENCOUNTER — Encounter: Payer: Self-pay | Admitting: Physician Assistant

## 2016-01-13 ENCOUNTER — Ambulatory Visit (INDEPENDENT_AMBULATORY_CARE_PROVIDER_SITE_OTHER): Payer: Medicare Other | Admitting: Physician Assistant

## 2016-01-13 VITALS — BP 110/82 | HR 79 | Ht 69.0 in | Wt 294.2 lb

## 2016-01-13 DIAGNOSIS — R079 Chest pain, unspecified: Secondary | ICD-10-CM

## 2016-01-13 DIAGNOSIS — G4733 Obstructive sleep apnea (adult) (pediatric): Secondary | ICD-10-CM | POA: Diagnosis not present

## 2016-01-13 DIAGNOSIS — R000 Tachycardia, unspecified: Secondary | ICD-10-CM

## 2016-01-13 DIAGNOSIS — J45909 Unspecified asthma, uncomplicated: Secondary | ICD-10-CM | POA: Diagnosis not present
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
## 2016-01-13 NOTE — Patient Instructions (Addendum)
Medication Instructions:  Your physician recommends that you continue on your current medications as directed. Please refer to the Current Medication list given to you today.  Labwork: None ordered  Testing/Procedures: Your physician has recommended that you wear a holter monitor. Holter monitors are medical devices that record the heart's electrical activity. Doctors most often use these monitors to diagnose arrhythmias. Arrhythmias are problems with the speed or rhythm of the heartbeat. The monitor is a small, portable device. You can wear one while you do your normal daily activities. This is usually used to diagnose what is causing palpitations/syncope (passing out).   Follow-Up: Your physician recommends that you schedule a follow-up appointment in: 3 MONTHS WITH DR. Johnsie Cancel   Any Other Special Instructions Will Be Listed Below (If Applicable).   Holter Monitoring A Holter monitor is a small device that is used to detect abnormal heart rhythms. It clips to your clothing and is connected by wires to flat, sticky disks (electrodes) that attach to your chest. It is worn continuously for 24-48 hours. HOME CARE INSTRUCTIONS  Wear your Holter monitor at all times, even while exercising and sleeping, for as long as directed by your health care provider.  Make sure that the Holter monitor is safely clipped to your clothing or close to your body as recommended by your health care provider.  Do not get the monitor or wires wet.  Do not put body lotion or moisturizer on your chest.  Keep your skin clean.  Keep a diary of your daily activities, such as walking and doing chores. If you feel that your heartbeat is abnormal or that your heart is fluttering or skipping a beat:  Record what you are doing when it happens.  Record what time of day the symptoms occur.  Return your Holter monitor as directed by your health care provider.  Keep all follow-up visits as directed by your health  care provider. This is important. SEEK IMMEDIATE MEDICAL CARE IF:  You feel lightheaded or you faint.  You have trouble breathing.  You feel pain in your chest, upper arm, or jaw.  You feel sick to your stomach and your skin is pale, cool, or damp.  You heartbeat feels unusual or abnormal.   This information is not intended to replace advice given to you by your health care provider. Make sure you discuss any questions you have with your health care provider.   Document Released: 11/25/2003 Document Revised: 03/19/2014 Document Reviewed: 10/05/2013 Elsevier Interactive Patient Education [employer].    If you need a refill on your cardiac medications before your next appointment, please call your pharmacy.

## 2016-01-17 ENCOUNTER — Telehealth: Payer: Self-pay | Admitting: *Deleted

## 2016-01-17 NOTE — Telephone Encounter (Signed)
Called pts [facility] to request pts records to be faxed here from his last o/v with high heart rate.  Left a message on their machine.

## 2016-01-17 NOTE — Telephone Encounter (Signed)
-----   Message from Jeanann Lewandowsky, [place] sent at 01/13/2016  2:29 PM EDT ----- Call [facility] to get pts records from last week (high hr) Ph# [phone]

## 2016-01-18 DIAGNOSIS — M791 Myalgia: Secondary | ICD-10-CM | POA: Diagnosis not present

## 2016-01-18 DIAGNOSIS — M461 Sacroiliitis, not elsewhere classified: Secondary | ICD-10-CM | POA: Diagnosis not present

## 2016-01-18 DIAGNOSIS — M546 Pain in thoracic spine: Secondary | ICD-10-CM | POA: Diagnosis not present

## 2016-01-18 DIAGNOSIS — M5023 Other cervical disc displacement, cervicothoracic region: Secondary | ICD-10-CM | POA: Diagnosis not present

## 2016-03-22 DIAGNOSIS — M47816 Spondylosis without myelopathy or radiculopathy, lumbar region: Secondary | ICD-10-CM | POA: Diagnosis not present

## 2016-03-22 DIAGNOSIS — M5136 Other intervertebral disc degeneration, lumbar region: Secondary | ICD-10-CM | POA: Diagnosis not present

## 2016-03-22 DIAGNOSIS — Z79891 Long term (current) use of opiate analgesic: Secondary | ICD-10-CM | POA: Diagnosis not present

## 2016-03-22 DIAGNOSIS — G8929 Other chronic pain: Secondary | ICD-10-CM | POA: Diagnosis not present

## 2016-03-22 DIAGNOSIS — F329 Major depressive disorder, single episode, unspecified: Secondary | ICD-10-CM | POA: Diagnosis not present
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
## 2016-04-10 ENCOUNTER — Ambulatory Visit (INDEPENDENT_AMBULATORY_CARE_PROVIDER_SITE_OTHER): Payer: Medicare Other

## 2016-04-10 ENCOUNTER — Encounter (INDEPENDENT_AMBULATORY_CARE_PROVIDER_SITE_OTHER): Payer: Self-pay

## 2016-04-10 DIAGNOSIS — R000 Tachycardia, unspecified: Secondary | ICD-10-CM

## 2016-04-18 ENCOUNTER — Ambulatory Visit (INDEPENDENT_AMBULATORY_CARE_PROVIDER_SITE_OTHER): Payer: Medicare Other | Admitting: Cardiovascular Disease

## 2016-04-18 ENCOUNTER — Encounter: Payer: Self-pay | Admitting: Cardiovascular Disease

## 2016-04-18 VITALS — BP 96/62 | HR 78 | Ht 69.0 in | Wt 299.0 lb

## 2016-04-18 DIAGNOSIS — R079 Chest pain, unspecified: Secondary | ICD-10-CM

## 2016-04-18 DIAGNOSIS — R0600 Dyspnea, unspecified: Secondary | ICD-10-CM

## 2016-04-18 MED ORDER — DIAZEPAM 5 MG PO TABS: ORAL_TABLET | ORAL | 0 refills | Status: DC

## 2016-04-18 NOTE — Patient Instructions (Addendum)

## 2016-04-23 ENCOUNTER — Ambulatory Visit (HOSPITAL_COMMUNITY): Payer: Medicare Other

## 2016-04-24 ENCOUNTER — Ambulatory Visit (HOSPITAL_COMMUNITY): Payer: Medicare Other

## 2016-05-01 DIAGNOSIS — M25531 Pain in right wrist: Secondary | ICD-10-CM | POA: Diagnosis not present

## 2016-05-07 DIAGNOSIS — M25531 Pain in right wrist: Secondary | ICD-10-CM | POA: Diagnosis not present

## 2016-05-07 DIAGNOSIS — M654 Radial styloid tenosynovitis [de Quervain]: Secondary | ICD-10-CM | POA: Diagnosis not present

## 2016-05-31 DIAGNOSIS — M654 Radial styloid tenosynovitis [de Quervain]: Secondary | ICD-10-CM | POA: Diagnosis not present

## 2016-06-27 DIAGNOSIS — M654 Radial styloid tenosynovitis [de Quervain]: Secondary | ICD-10-CM | POA: Diagnosis not present

## 2016-06-27 DIAGNOSIS — M25531 Pain in right wrist: Secondary | ICD-10-CM | POA: Diagnosis not present

## 2016-07-27 ENCOUNTER — Telehealth (HOSPITAL_COMMUNITY): Payer: Self-pay | Admitting: Cardiovascular Disease

## 2016-07-27 NOTE — Telephone Encounter (Signed)
I called pt and spoke with him to get him scheduled for a myoview that has been requested by Dr. Johnsie Cancel. Pt is requesting that his prescription for Diazepam be refilled to administer prior to test due to his claustrophobia.    *STAT* If patient is at the pharmacy, call can be transferred to refill team.   1. Which medications need to be refilled? (please list name of each medication and dose if known) Diazepam 5mg      2. Which pharmacy/location (including street and city if local pharmacy) is medication to be sent to?[facility] on [address] [phone] phone)  3. Do they need a 30 day or 90 day supply? [place]

## 2016-07-27 NOTE — Telephone Encounter (Signed)
Called into patient's pharmacy for Valium 5 mg by mouth to take one hour before test. Patient only needs two tablets for two-day test, with no refills

## 2016-07-27 NOTE — Telephone Encounter (Signed)
*  STAT* If patient is at the pharmacy, call can be transferred to refill team.   1. Which medications need to be refilled? (please list name of each medication and dose if known) Diazepam 5mg   2. Which pharmacy/location (including street and city if local pharmacy) is medication to be sent to?[facility] on [address] [phone])  3. Do they need a 30 day or 90 day supply? 30  Needed for Claustophobia  He will be having a Nuclear test on 5/23

## 2016-07-27 NOTE — Telephone Encounter (Signed)
Called patient about his medication. Patient was given prescription before for Valium 5 mg by mouth once before procedure. Patient stated he lost his prescription. Since patient is having his test next week and Dr. Johnsie Cancel is out of town, Catherine (DOD) agreed to give patient Valium 5 mg by mouth to take before procedure 2 tablets no refills- patient is suppose to be two day protocol for myoview. Called in medication to patient's pharmacy of choice. Will leave instructions for test at front desk for patient to pick up on Monday. Patent verbalized understanding.

## 2016-07-30 ENCOUNTER — Telehealth (HOSPITAL_COMMUNITY): Payer: Self-pay | Admitting: *Deleted

## 2016-07-30 NOTE — Telephone Encounter (Signed)
Patient given detailed instructions per Myocardial Perfusion Study Information Sheet for the test on 08/01/16 Patient notified to arrive 15 minutes early and that it is imperative to arrive on time for appointment to keep from having the test rescheduled.  If you need to cancel or reschedule your appointment, please call the office within 24 hours of your appointment. . Patient verbalized understanding. Kirstie Peri

## 2016-07-31 ENCOUNTER — Telehealth: Payer: Self-pay | Admitting: Cardiology

## 2016-07-31 ENCOUNTER — Telehealth: Payer: Self-pay | Admitting: Pulmonary Disease

## 2016-07-31 NOTE — Telephone Encounter (Signed)
There are alternative cardiac test that wouldn't require him to walk on a treadmill.  I am not sure why pulmonary would need to determine this.  I will route message to Dr. Johnsie Cancel with cardiology to address whether additional information from pulmonary is needed to determine optimal cardiac test to assess his atypical chest pain.

## 2016-07-31 NOTE — Telephone Encounter (Signed)
Received page from patient.  He put calls in to his outpatient providers re: stress test tomorrow, because of concerns that he would not be able to exercise to target.  He has stable DOE at present.  I encouraged him to present to stress lab tomorrow and relay his concerns.  Of note, he last had a regadenoson nuclear MPI study in 2013 which he tolerated without difficulty.  Patient is in agreement with the plan.

## 2016-07-31 NOTE — Telephone Encounter (Signed)
Called and spoke to pt. Pt states he has a stress test with cards on 08/01/16 at [facility] and states it is scheduled to be on the treadmill but states the last time he had a stress test (pt states it was in 2013) with a treadmill he was unable to complete the test. Pt states he told his cardiologist this and they informed him to contact pulmonary. Pt is now requesting the recs of VS as to if he should have the stress test with the treadmill or without. Pt states he has been having an increase in intermittent SOB d/t seasonal allergies. Pt denies CP/tightness, f/c/s, and cough.   Dr. Halford Chessman please advise if pt can do stress test with treadmill or without. Thanks.

## 2016-08-01 ENCOUNTER — Ambulatory Visit (HOSPITAL_COMMUNITY): Payer: Medicare Other | Attending: Cardiovascular Disease

## 2016-08-01 DIAGNOSIS — Z8249 Family history of ischemic heart disease and other diseases of the circulatory system: Secondary | ICD-10-CM | POA: Diagnosis not present

## 2016-08-01 DIAGNOSIS — R0609 Other forms of dyspnea: Secondary | ICD-10-CM | POA: Insufficient documentation

## 2016-08-01 DIAGNOSIS — R0600 Dyspnea, unspecified: Secondary | ICD-10-CM | POA: Diagnosis not present

## 2016-08-01 DIAGNOSIS — R079 Chest pain, unspecified: Secondary | ICD-10-CM | POA: Diagnosis not present

## 2016-08-01 DIAGNOSIS — I2510 Atherosclerotic heart disease of native coronary artery without angina pectoris: Secondary | ICD-10-CM | POA: Diagnosis present

## 2016-08-01 LAB — MYOCARDIAL PERFUSION IMAGING
LV dias vol: 147 mL (ref 62–150)
LV sys vol: 68 mL
Peak HR: 99 {beats}/min
RATE: 0.31
Rest HR: 66 {beats}/min
SDS: 1
SRS: 3
SSS: 4
TID: 1.06

## 2016-08-01 MED ORDER — TECHNETIUM TC 99M TETROFOSMIN IV KIT
32.7000 | PACK | Freq: Once | INTRAVENOUS | Status: AC | PRN
  Administered 2016-08-01: 32.7 via INTRAVENOUS
  Filled 2016-08-01: qty 33

## 2016-08-01 MED ORDER — REGADENOSON 0.4 MG/5ML IV SOLN
0.4000 mg | Freq: Once | INTRAVENOUS | Status: AC
  Administered 2016-08-01: 0.4 mg via INTRAVENOUS

## 2016-08-01 MED ORDER — TECHNETIUM TC 99M TETROFOSMIN IV KIT
10.2000 | PACK | Freq: Once | INTRAVENOUS | Status: AC | PRN
  Administered 2016-08-01: 10.2 via INTRAVENOUS
  Filled 2016-08-01: qty 11

## 2016-08-07 NOTE — Telephone Encounter (Signed)
VS do we need to order this?  Please advise. thanks

## 2016-08-07 NOTE — Telephone Encounter (Signed)
Ok to change to [employer]

## 2016-08-07 NOTE — Telephone Encounter (Signed)
No, this is a test ordered by cardiology.

## 2016-08-08 NOTE — Telephone Encounter (Signed)
Called and spoke with pt and he stated that the treadmill test was not done and they ended up doing the test where the medication was given via IV.  Nothing further is needed.

## 2016-08-22 DIAGNOSIS — M791 Myalgia: Secondary | ICD-10-CM | POA: Diagnosis not present

## 2016-08-22 DIAGNOSIS — M545 Low back pain: Secondary | ICD-10-CM | POA: Diagnosis not present

## 2016-08-28 DIAGNOSIS — H52203 Unspecified astigmatism, bilateral: Secondary | ICD-10-CM | POA: Diagnosis not present

## 2016-08-28 DIAGNOSIS — H524 Presbyopia: Secondary | ICD-10-CM | POA: Diagnosis not present

## 2016-08-28 DIAGNOSIS — H5213 Myopia, bilateral: Secondary | ICD-10-CM | POA: Diagnosis not present

## 2016-08-30 DIAGNOSIS — M545 Low back pain: Secondary | ICD-10-CM | POA: Diagnosis not present

## 2016-08-30 DIAGNOSIS — M791 Myalgia: Secondary | ICD-10-CM | POA: Diagnosis not present

## 2016-09-05 DIAGNOSIS — M545 Low back pain: Secondary | ICD-10-CM | POA: Diagnosis not present

## 2016-09-05 DIAGNOSIS — M654 Radial styloid tenosynovitis [de Quervain]: Secondary | ICD-10-CM | POA: Diagnosis not present

## 2016-09-05 DIAGNOSIS — M791 Myalgia: Secondary | ICD-10-CM | POA: Diagnosis not present

## 2016-09-05 DIAGNOSIS — M25531 Pain in right wrist: Secondary | ICD-10-CM | POA: Diagnosis not present

## 2016-09-11 DIAGNOSIS — G8929 Other chronic pain: Secondary | ICD-10-CM | POA: Diagnosis not present

## 2016-09-11 DIAGNOSIS — M47814 Spondylosis without myelopathy or radiculopathy, thoracic region: Secondary | ICD-10-CM | POA: Diagnosis not present

## 2016-09-11 DIAGNOSIS — Z79891 Long term (current) use of opiate analgesic: Secondary | ICD-10-CM | POA: Diagnosis not present

## 2016-09-11 DIAGNOSIS — M47816 Spondylosis without myelopathy or radiculopathy, lumbar region: Secondary | ICD-10-CM | POA: Diagnosis not present

## 2016-09-13 DIAGNOSIS — M461 Sacroiliitis, not elsewhere classified: Secondary | ICD-10-CM | POA: Diagnosis not present

## 2016-09-13 DIAGNOSIS — R200 Anesthesia of skin: Secondary | ICD-10-CM | POA: Diagnosis not present

## 2016-09-13 DIAGNOSIS — M79601 Pain in right arm: Secondary | ICD-10-CM | POA: Diagnosis not present

## 2016-09-13 DIAGNOSIS — M545 Low back pain: Secondary | ICD-10-CM | POA: Diagnosis not present

## 2016-09-13 DIAGNOSIS — M791 Myalgia: Secondary | ICD-10-CM | POA: Diagnosis not present

## 2016-09-13 DIAGNOSIS — M25531 Pain in right wrist: Secondary | ICD-10-CM | POA: Diagnosis not present

## 2016-09-13 DIAGNOSIS — M5127 Other intervertebral disc displacement, lumbosacral region: Secondary | ICD-10-CM | POA: Diagnosis not present

## 2016-09-20 DIAGNOSIS — M791 Myalgia: Secondary | ICD-10-CM | POA: Diagnosis not present

## 2016-09-20 DIAGNOSIS — M546 Pain in thoracic spine: Secondary | ICD-10-CM | POA: Diagnosis not present

## 2016-09-25 DIAGNOSIS — M654 Radial styloid tenosynovitis [de Quervain]: Secondary | ICD-10-CM | POA: Diagnosis not present

## 2016-09-25 DIAGNOSIS — M25531 Pain in right wrist: Secondary | ICD-10-CM | POA: Diagnosis not present

## 2016-09-26 DIAGNOSIS — M791 Myalgia: Secondary | ICD-10-CM | POA: Diagnosis not present

## 2016-09-26 DIAGNOSIS — M546 Pain in thoracic spine: Secondary | ICD-10-CM | POA: Diagnosis not present

## 2016-10-03 DIAGNOSIS — M791 Myalgia: Secondary | ICD-10-CM | POA: Diagnosis not present

## 2016-10-03 DIAGNOSIS — M546 Pain in thoracic spine: Secondary | ICD-10-CM | POA: Diagnosis not present

## 2016-10-03 DIAGNOSIS — M25531 Pain in right wrist: Secondary | ICD-10-CM | POA: Diagnosis not present

## 2016-10-03 DIAGNOSIS — M654 Radial styloid tenosynovitis [de Quervain]: Secondary | ICD-10-CM | POA: Diagnosis not present

## 2016-10-10 DIAGNOSIS — M461 Sacroiliitis, not elsewhere classified: Secondary | ICD-10-CM | POA: Diagnosis not present

## 2016-10-10 DIAGNOSIS — M546 Pain in thoracic spine: Secondary | ICD-10-CM | POA: Diagnosis not present

## 2016-10-10 DIAGNOSIS — M5126 Other intervertebral disc displacement, lumbar region: Secondary | ICD-10-CM | POA: Diagnosis not present

## 2016-10-10 DIAGNOSIS — M791 Myalgia: Secondary | ICD-10-CM | POA: Diagnosis not present

## 2016-10-11 DIAGNOSIS — G8929 Other chronic pain: Secondary | ICD-10-CM | POA: Diagnosis not present

## 2016-10-11 DIAGNOSIS — M25552 Pain in left hip: Secondary | ICD-10-CM | POA: Diagnosis not present

## 2016-10-17 ENCOUNTER — Other Ambulatory Visit (HOSPITAL_COMMUNITY): Payer: Self-pay | Admitting: Orthopedic Surgery

## 2016-10-17 DIAGNOSIS — M25552 Pain in left hip: Secondary | ICD-10-CM

## 2016-11-06 DIAGNOSIS — S39012A Strain of muscle, fascia and tendon of lower back, initial encounter: Secondary | ICD-10-CM | POA: Diagnosis not present

## 2016-11-15 DIAGNOSIS — E7800 Pure hypercholesterolemia, unspecified: Secondary | ICD-10-CM | POA: Diagnosis not present

## 2016-11-15 DIAGNOSIS — G629 Polyneuropathy, unspecified: Secondary | ICD-10-CM | POA: Diagnosis not present

## 2016-11-15 DIAGNOSIS — G8929 Other chronic pain: Secondary | ICD-10-CM | POA: Diagnosis not present

## 2016-11-15 DIAGNOSIS — M5441 Lumbago with sciatica, right side: Secondary | ICD-10-CM | POA: Diagnosis not present

## 2016-11-15 DIAGNOSIS — M5442 Lumbago with sciatica, left side: Secondary | ICD-10-CM | POA: Diagnosis not present

## 2016-11-15 DIAGNOSIS — I10 Essential (primary) hypertension: Secondary | ICD-10-CM | POA: Diagnosis not present

## 2016-11-15 DIAGNOSIS — Z79899 Other long term (current) drug therapy: Secondary | ICD-10-CM | POA: Diagnosis not present

## 2016-11-21 DIAGNOSIS — M5440 Lumbago with sciatica, unspecified side: Secondary | ICD-10-CM | POA: Diagnosis not present

## 2016-11-21 DIAGNOSIS — G629 Polyneuropathy, unspecified: Secondary | ICD-10-CM | POA: Diagnosis not present

## 2016-11-21 DIAGNOSIS — M5442 Lumbago with sciatica, left side: Secondary | ICD-10-CM | POA: Diagnosis not present

## 2016-11-21 DIAGNOSIS — M5441 Lumbago with sciatica, right side: Secondary | ICD-10-CM | POA: Diagnosis not present

## 2016-12-11 DIAGNOSIS — G8929 Other chronic pain: Secondary | ICD-10-CM | POA: Diagnosis not present

## 2016-12-11 DIAGNOSIS — M25552 Pain in left hip: Secondary | ICD-10-CM | POA: Diagnosis not present

## 2016-12-18 DIAGNOSIS — G8929 Other chronic pain: Secondary | ICD-10-CM | POA: Diagnosis not present

## 2016-12-18 DIAGNOSIS — M25859 Other specified joint disorders, unspecified hip: Secondary | ICD-10-CM | POA: Diagnosis not present

## 2016-12-18 DIAGNOSIS — M25552 Pain in left hip: Secondary | ICD-10-CM | POA: Diagnosis not present

## 2016-12-18 DIAGNOSIS — M24852 Other specific joint derangements of left hip, not elsewhere classified: Secondary | ICD-10-CM | POA: Diagnosis not present

## 2016-12-24 DIAGNOSIS — M549 Dorsalgia, unspecified: Secondary | ICD-10-CM | POA: Diagnosis not present

## 2016-12-24 DIAGNOSIS — S3992XA Unspecified injury of lower back, initial encounter: Secondary | ICD-10-CM | POA: Diagnosis not present

## 2017-01-02 DIAGNOSIS — M5442 Lumbago with sciatica, left side: Secondary | ICD-10-CM | POA: Diagnosis not present

## 2017-01-03 DIAGNOSIS — M542 Cervicalgia: Secondary | ICD-10-CM | POA: Diagnosis not present

## 2017-01-03 DIAGNOSIS — M5442 Lumbago with sciatica, left side: Secondary | ICD-10-CM | POA: Diagnosis not present

## 2017-01-08 DIAGNOSIS — M542 Cervicalgia: Secondary | ICD-10-CM | POA: Diagnosis not present

## 2017-01-08 DIAGNOSIS — M5442 Lumbago with sciatica, left side: Secondary | ICD-10-CM | POA: Diagnosis not present

## 2017-01-15 DIAGNOSIS — M5442 Lumbago with sciatica, left side: Secondary | ICD-10-CM | POA: Diagnosis not present

## 2017-01-15 DIAGNOSIS — M542 Cervicalgia: Secondary | ICD-10-CM | POA: Diagnosis not present

## 2017-01-17 DIAGNOSIS — M1612 Unilateral primary osteoarthritis, left hip: Secondary | ICD-10-CM | POA: Diagnosis not present

## 2017-01-18 DIAGNOSIS — Z23 Encounter for immunization: Secondary | ICD-10-CM | POA: Diagnosis not present

## 2017-01-18 DIAGNOSIS — G8929 Other chronic pain: Secondary | ICD-10-CM | POA: Diagnosis not present

## 2017-01-18 DIAGNOSIS — M5442 Lumbago with sciatica, left side: Secondary | ICD-10-CM | POA: Diagnosis not present

## 2017-01-18 DIAGNOSIS — M5441 Lumbago with sciatica, right side: Secondary | ICD-10-CM | POA: Diagnosis not present

## 2017-01-18 DIAGNOSIS — Z79899 Other long term (current) drug therapy: Secondary | ICD-10-CM | POA: Diagnosis not present

## 2017-01-22 DIAGNOSIS — M542 Cervicalgia: Secondary | ICD-10-CM | POA: Diagnosis not present

## 2017-01-22 DIAGNOSIS — M5442 Lumbago with sciatica, left side: Secondary | ICD-10-CM | POA: Diagnosis not present

## 2017-01-29 DIAGNOSIS — M25852 Other specified joint disorders, left hip: Secondary | ICD-10-CM | POA: Diagnosis not present

## 2017-01-29 DIAGNOSIS — M169 Osteoarthritis of hip, unspecified: Secondary | ICD-10-CM | POA: Diagnosis not present

## 2017-01-29 DIAGNOSIS — M7062 Trochanteric bursitis, left hip: Secondary | ICD-10-CM | POA: Diagnosis not present

## 2017-02-04 ENCOUNTER — Emergency Department (HOSPITAL_COMMUNITY): Payer: Medicare Other

## 2017-02-04 ENCOUNTER — Encounter (HOSPITAL_COMMUNITY): Payer: Self-pay

## 2017-02-04 ENCOUNTER — Other Ambulatory Visit: Payer: Self-pay

## 2017-02-04 ENCOUNTER — Telehealth: Payer: Self-pay | Admitting: Cardiovascular Disease

## 2017-02-04 ENCOUNTER — Emergency Department (HOSPITAL_COMMUNITY)
Admission: EM | Admit: 2017-02-04 | Discharge: 2017-02-04 | Disposition: A | Discharge status: Home / Self Care | Payer: Medicare Other | Attending: Emergency Medicine | Admitting: Emergency Medicine

## 2017-02-04 DIAGNOSIS — F329 Major depressive disorder, single episode, unspecified: Secondary | ICD-10-CM | POA: Insufficient documentation

## 2017-02-04 DIAGNOSIS — J45909 Unspecified asthma, uncomplicated: Secondary | ICD-10-CM | POA: Insufficient documentation

## 2017-02-04 DIAGNOSIS — R0789 Other chest pain: Secondary | ICD-10-CM

## 2017-02-04 DIAGNOSIS — R079 Chest pain, unspecified: Secondary | ICD-10-CM | POA: Diagnosis not present

## 2017-02-04 DIAGNOSIS — F419 Anxiety disorder, unspecified: Secondary | ICD-10-CM | POA: Insufficient documentation

## 2017-02-04 LAB — I-STAT TROPONIN, ED
Troponin i, poc: 0 ng/mL (ref 0.00–0.08)
Troponin i, poc: 0 ng/mL (ref 0.00–0.08)

## 2017-02-04 LAB — BASIC METABOLIC PANEL
Anion gap: 6 (ref 5–15)
BUN: 10 mg/dL (ref 6–20)
CO2: 23 mmol/L (ref 22–32)
Calcium: 8.5 mg/dL — ABNORMAL LOW (ref 8.9–10.3)
Chloride: 108 mmol/L (ref 101–111)
Creatinine, Ser: 1.04 mg/dL (ref 0.61–1.24)
GFR calc Af Amer: >60 mL/min (ref >60)
GFR calc non Af Amer: >60 mL/min (ref >60)
Glucose, Bld: 90 mg/dL (ref 65–99)
Potassium: 3.7 mmol/L (ref 3.5–5.1)
Sodium: 137 mmol/L (ref 135–145)

## 2017-02-04 LAB — CBC
HCT: 42 % (ref 39.0–52.0)
Hemoglobin: 14.3 g/dL (ref 13.0–17.0)
MCH: 28.9 pg (ref 26.0–34.0)
MCHC: 34 g/dL (ref 30.0–36.0)
MCV: 85 fL (ref 78.0–100.0)
Platelets: 132 10*3/uL — ABNORMAL LOW (ref 150–400)
RBC: 4.94 MIL/uL (ref 4.22–5.81)
RDW: 14 % (ref 11.5–15.5)
WBC: 3.8 10*3/uL — ABNORMAL LOW (ref 4.0–10.5)

## 2017-02-04 MED ORDER — IBUPROFEN 200 MG PO TABS
600.0000 mg | ORAL_TABLET | Freq: Once | ORAL | Status: AC
  Administered 2017-02-04: 600 mg via ORAL
  Filled 2017-02-04: qty 1

## 2017-02-04 NOTE — Telephone Encounter (Signed)
New message    Pt c/o of Chest Pain: STAT if CP now or developed within 24 hours  1. Are you having CP right now? YES  2. Are you experiencing any other symptoms (ex. SOB, nausea, vomiting, sweating)? SOB  3. How long have you been experiencing CP? 1 week ago and went away returning this morning  4. Is your CP continuous or coming and going? Coming and Going  5. Have you taken Nitroglycerin? NO ?
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
## 2017-02-04 NOTE — ED Notes (Signed)
ED Provider at bedside. 

## 2017-02-04 NOTE — Telephone Encounter (Signed)
Received call transferred directly from operator and spoke with pt. He had chest pain about a week ago. Lasted a couple of hours. Relieved with aspirin. Today chest pain returned. Off and on since this morning. Took aspirin but no relief. Has no energy. Short of breath and states it is hard to talk.  I instructed pt to call 911 for transport to hospital.

## 2017-02-04 NOTE — Discharge Instructions (Signed)
Please read instructions below.  Follow up with your cardiologist, Dr. Johnsie Cancel, about your visit today.  You can take advil/ibuprofen as needed for pain. Return to the ER for new or worsening symptoms; including worsening chest pain, shortness of breath, pain that radiates to the arm or neck, pain or shortness of breath worsened with exertion.

## 2017-02-04 NOTE — ED Triage Notes (Signed)
Per Pt, Pt is coming from home with complaints of chest pain that started this morning when he woke up. Pt reports that he had it last week and it subsided with an ASA. Today, pt took aspirin and it didn't go away. Reports some nausea, lightheadedness, and SOB.

## 2017-02-04 NOTE — ED Notes (Signed)
Pt family to [facility] requesting update apologized for delay

## 2017-02-28 DIAGNOSIS — M5442 Lumbago with sciatica, left side: Secondary | ICD-10-CM | POA: Diagnosis not present

## 2017-02-28 DIAGNOSIS — M5441 Lumbago with sciatica, right side: Secondary | ICD-10-CM | POA: Diagnosis not present

## 2017-02-28 DIAGNOSIS — M62830 Muscle spasm of back: Secondary | ICD-10-CM | POA: Diagnosis not present

## 2017-02-28 DIAGNOSIS — G8929 Other chronic pain: Secondary | ICD-10-CM | POA: Diagnosis not present

## 2017-02-28 DIAGNOSIS — Z79899 Other long term (current) drug therapy: Secondary | ICD-10-CM | POA: Diagnosis not present

## 2017-03-19 DIAGNOSIS — M25552 Pain in left hip: Secondary | ICD-10-CM | POA: Diagnosis not present

## 2017-03-19 DIAGNOSIS — Z6841 Body Mass Index (BMI) 40.0 and over, adult: Secondary | ICD-10-CM | POA: Diagnosis not present

## 2017-03-19 DIAGNOSIS — M1612 Unilateral primary osteoarthritis, left hip: Secondary | ICD-10-CM | POA: Diagnosis not present

## 2017-03-19 DIAGNOSIS — M76892 Other specified enthesopathies of left lower limb, excluding foot: Secondary | ICD-10-CM | POA: Diagnosis not present

## 2017-04-02 DIAGNOSIS — M7062 Trochanteric bursitis, left hip: Secondary | ICD-10-CM | POA: Diagnosis not present

## 2017-04-02 DIAGNOSIS — Z79899 Other long term (current) drug therapy: Secondary | ICD-10-CM | POA: Diagnosis not present

## 2017-04-02 DIAGNOSIS — M24852 Other specific joint derangements of left hip, not elsewhere classified: Secondary | ICD-10-CM | POA: Diagnosis not present

## 2017-04-02 DIAGNOSIS — M5441 Lumbago with sciatica, right side: Secondary | ICD-10-CM | POA: Diagnosis not present

## 2017-04-02 DIAGNOSIS — G8929 Other chronic pain: Secondary | ICD-10-CM | POA: Diagnosis not present

## 2017-04-02 DIAGNOSIS — M5442 Lumbago with sciatica, left side: Secondary | ICD-10-CM | POA: Diagnosis not present

## 2017-04-02 DIAGNOSIS — S43439A Superior glenoid labrum lesion of unspecified shoulder, initial encounter: Secondary | ICD-10-CM | POA: Insufficient documentation

## 2017-04-15 DIAGNOSIS — M7061 Trochanteric bursitis, right hip: Secondary | ICD-10-CM | POA: Insufficient documentation

## 2017-04-15 DIAGNOSIS — M7062 Trochanteric bursitis, left hip: Secondary | ICD-10-CM | POA: Diagnosis not present

## 2017-04-17 DIAGNOSIS — M7062 Trochanteric bursitis, left hip: Secondary | ICD-10-CM | POA: Diagnosis not present

## 2017-04-24 DIAGNOSIS — M7062 Trochanteric bursitis, left hip: Secondary | ICD-10-CM | POA: Diagnosis not present

## 2017-05-02 DIAGNOSIS — F4310 Post-traumatic stress disorder, unspecified: Secondary | ICD-10-CM | POA: Diagnosis not present

## 2017-05-06 DIAGNOSIS — G8929 Other chronic pain: Secondary | ICD-10-CM | POA: Diagnosis not present

## 2017-05-06 DIAGNOSIS — Z79899 Other long term (current) drug therapy: Secondary | ICD-10-CM | POA: Diagnosis not present

## 2017-05-06 DIAGNOSIS — M5442 Lumbago with sciatica, left side: Secondary | ICD-10-CM | POA: Diagnosis not present

## 2017-05-06 DIAGNOSIS — M5441 Lumbago with sciatica, right side: Secondary | ICD-10-CM | POA: Diagnosis not present

## 2017-05-06 DIAGNOSIS — M62830 Muscle spasm of back: Secondary | ICD-10-CM | POA: Diagnosis not present

## 2017-05-07 DIAGNOSIS — M7062 Trochanteric bursitis, left hip: Secondary | ICD-10-CM | POA: Diagnosis not present

## 2017-05-14 DIAGNOSIS — M24852 Other specific joint derangements of left hip, not elsewhere classified: Secondary | ICD-10-CM | POA: Diagnosis not present

## 2017-05-14 DIAGNOSIS — M7062 Trochanteric bursitis, left hip: Secondary | ICD-10-CM | POA: Diagnosis not present

## 2017-05-16 DIAGNOSIS — F4310 Post-traumatic stress disorder, unspecified: Secondary | ICD-10-CM | POA: Diagnosis not present

## 2017-06-03 DIAGNOSIS — Z79899 Other long term (current) drug therapy: Secondary | ICD-10-CM | POA: Diagnosis not present

## 2017-06-03 DIAGNOSIS — M5442 Lumbago with sciatica, left side: Secondary | ICD-10-CM | POA: Diagnosis not present

## 2017-06-03 DIAGNOSIS — M5441 Lumbago with sciatica, right side: Secondary | ICD-10-CM | POA: Diagnosis not present

## 2017-06-03 DIAGNOSIS — G629 Polyneuropathy, unspecified: Secondary | ICD-10-CM | POA: Diagnosis not present

## 2017-06-03 DIAGNOSIS — G8929 Other chronic pain: Secondary | ICD-10-CM | POA: Diagnosis not present

## 2017-06-04 DIAGNOSIS — Z0000 Encounter for general adult medical examination without abnormal findings: Secondary | ICD-10-CM | POA: Diagnosis not present

## 2017-06-04 DIAGNOSIS — R072 Precordial pain: Secondary | ICD-10-CM | POA: Diagnosis not present

## 2017-06-04 DIAGNOSIS — E559 Vitamin D deficiency, unspecified: Secondary | ICD-10-CM | POA: Diagnosis not present

## 2017-06-04 DIAGNOSIS — R5383 Other fatigue: Secondary | ICD-10-CM | POA: Diagnosis not present

## 2017-06-04 DIAGNOSIS — Z125 Encounter for screening for malignant neoplasm of prostate: Secondary | ICD-10-CM | POA: Diagnosis not present

## 2017-06-04 DIAGNOSIS — R0602 Shortness of breath: Secondary | ICD-10-CM | POA: Diagnosis not present

## 2017-06-05 DIAGNOSIS — F4310 Post-traumatic stress disorder, unspecified: Secondary | ICD-10-CM | POA: Diagnosis not present

## 2017-06-11 ENCOUNTER — Ambulatory Visit (INDEPENDENT_AMBULATORY_CARE_PROVIDER_SITE_OTHER): Payer: Medicare Other | Admitting: Psychiatry

## 2017-06-11 ENCOUNTER — Encounter (HOSPITAL_COMMUNITY): Payer: Self-pay | Admitting: Psychiatry

## 2017-06-11 DIAGNOSIS — M2550 Pain in unspecified joint: Secondary | ICD-10-CM

## 2017-06-11 DIAGNOSIS — F419 Anxiety disorder, unspecified: Secondary | ICD-10-CM | POA: Diagnosis not present

## 2017-06-11 DIAGNOSIS — G4700 Insomnia, unspecified: Secondary | ICD-10-CM

## 2017-06-11 DIAGNOSIS — G894 Chronic pain syndrome: Secondary | ICD-10-CM

## 2017-06-11 DIAGNOSIS — R450 Nervousness: Secondary | ICD-10-CM | POA: Diagnosis not present

## 2017-06-11 DIAGNOSIS — M549 Dorsalgia, unspecified: Secondary | ICD-10-CM

## 2017-06-11 MED ORDER — ARIPIPRAZOLE 2 MG PO TABS: 2.0000 mg | ORAL_TABLET | Freq: Every day | ORAL | 0 refills | Status: DC

## 2017-06-11 MED ORDER — DESVENLAFAXINE SUCCINATE ER 100 MG PO TB24: 100.0000 mg | ORAL_TABLET | Freq: Every day | ORAL | 0 refills | Status: DC

## 2017-06-11 MED ORDER — PREGABALIN 300 MG PO CAPS: 300.0000 mg | ORAL_CAPSULE | Freq: Two times a day (BID) | ORAL | 3 refills | Status: DC

## 2017-06-11 MED ORDER — PRAZOSIN HCL 5 MG PO CAPS: 5.0000 mg | ORAL_CAPSULE | Freq: Every day | ORAL | 0 refills | Status: DC

## 2017-06-11 NOTE — Patient Instructions (Signed)
INCREASE Pristiq to 100 mg daily (I sent the new 100 mg capsule)  Continue Abilify 2 mg in the MORNING  Continue Prazosin 5 mg at night  Continue Lyrica 300 mg twice a day (take the second dose right at bedtime)  Come back in 8 weeks to see me
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
## 2017-06-18 DIAGNOSIS — F419 Anxiety disorder, unspecified: Secondary | ICD-10-CM | POA: Diagnosis not present

## 2017-06-18 DIAGNOSIS — F329 Major depressive disorder, single episode, unspecified: Secondary | ICD-10-CM | POA: Diagnosis not present

## 2017-06-18 DIAGNOSIS — R5383 Other fatigue: Secondary | ICD-10-CM | POA: Diagnosis not present

## 2017-06-18 DIAGNOSIS — E559 Vitamin D deficiency, unspecified: Secondary | ICD-10-CM | POA: Diagnosis not present

## 2017-06-20 DIAGNOSIS — K635 Polyp of colon: Secondary | ICD-10-CM | POA: Diagnosis not present

## 2017-06-25 DIAGNOSIS — F4310 Post-traumatic stress disorder, unspecified: Secondary | ICD-10-CM | POA: Diagnosis not present

## 2017-06-26 DIAGNOSIS — F4001 Agoraphobia with panic disorder: Secondary | ICD-10-CM | POA: Diagnosis not present

## 2017-06-26 DIAGNOSIS — F329 Major depressive disorder, single episode, unspecified: Secondary | ICD-10-CM | POA: Diagnosis not present

## 2017-07-08 DIAGNOSIS — M5441 Lumbago with sciatica, right side: Secondary | ICD-10-CM | POA: Diagnosis not present

## 2017-07-08 DIAGNOSIS — G8929 Other chronic pain: Secondary | ICD-10-CM | POA: Diagnosis not present

## 2017-07-08 DIAGNOSIS — F419 Anxiety disorder, unspecified: Secondary | ICD-10-CM | POA: Diagnosis not present

## 2017-07-08 DIAGNOSIS — Z79899 Other long term (current) drug therapy: Secondary | ICD-10-CM | POA: Diagnosis not present

## 2017-07-08 DIAGNOSIS — M5442 Lumbago with sciatica, left side: Secondary | ICD-10-CM | POA: Diagnosis not present

## 2017-07-17 DIAGNOSIS — F4310 Post-traumatic stress disorder, unspecified: Secondary | ICD-10-CM | POA: Diagnosis not present

## 2017-07-22 DIAGNOSIS — M791 Myalgia, unspecified site: Secondary | ICD-10-CM | POA: Diagnosis not present

## 2017-07-22 DIAGNOSIS — M461 Sacroiliitis, not elsewhere classified: Secondary | ICD-10-CM | POA: Diagnosis not present

## 2017-07-22 DIAGNOSIS — M5124 Other intervertebral disc displacement, thoracic region: Secondary | ICD-10-CM | POA: Diagnosis not present

## 2017-07-22 DIAGNOSIS — M545 Low back pain: Secondary | ICD-10-CM | POA: Diagnosis not present

## 2017-07-22 DIAGNOSIS — M5127 Other intervertebral disc displacement, lumbosacral region: Secondary | ICD-10-CM | POA: Diagnosis not present

## 2017-07-24 DIAGNOSIS — Z01818 Encounter for other preprocedural examination: Secondary | ICD-10-CM | POA: Diagnosis not present

## 2017-07-24 DIAGNOSIS — Z1211 Encounter for screening for malignant neoplasm of colon: Secondary | ICD-10-CM | POA: Diagnosis not present

## 2017-07-29 DIAGNOSIS — M5124 Other intervertebral disc displacement, thoracic region: Secondary | ICD-10-CM | POA: Diagnosis not present

## 2017-07-29 DIAGNOSIS — M546 Pain in thoracic spine: Secondary | ICD-10-CM | POA: Diagnosis not present

## 2017-07-29 DIAGNOSIS — M9901 Segmental and somatic dysfunction of cervical region: Secondary | ICD-10-CM | POA: Diagnosis not present

## 2017-07-29 DIAGNOSIS — M5127 Other intervertebral disc displacement, lumbosacral region: Secondary | ICD-10-CM | POA: Diagnosis not present

## 2017-07-29 DIAGNOSIS — M6240 Contracture of muscle, unspecified site: Secondary | ICD-10-CM | POA: Diagnosis not present

## 2017-07-29 DIAGNOSIS — M545 Low back pain: Secondary | ICD-10-CM | POA: Diagnosis not present

## 2017-07-29 DIAGNOSIS — M9903 Segmental and somatic dysfunction of lumbar region: Secondary | ICD-10-CM | POA: Diagnosis not present

## 2017-07-29 DIAGNOSIS — M461 Sacroiliitis, not elsewhere classified: Secondary | ICD-10-CM | POA: Diagnosis not present

## 2017-07-29 DIAGNOSIS — M791 Myalgia, unspecified site: Secondary | ICD-10-CM | POA: Diagnosis not present

## 2017-08-07 DIAGNOSIS — G8929 Other chronic pain: Secondary | ICD-10-CM | POA: Diagnosis not present

## 2017-08-07 DIAGNOSIS — M5442 Lumbago with sciatica, left side: Secondary | ICD-10-CM | POA: Diagnosis not present

## 2017-08-07 DIAGNOSIS — M5441 Lumbago with sciatica, right side: Secondary | ICD-10-CM | POA: Diagnosis not present

## 2017-08-07 DIAGNOSIS — M5137 Other intervertebral disc degeneration, lumbosacral region: Secondary | ICD-10-CM | POA: Diagnosis not present

## 2017-08-07 DIAGNOSIS — Z79899 Other long term (current) drug therapy: Secondary | ICD-10-CM | POA: Diagnosis not present

## 2017-08-08 ENCOUNTER — Ambulatory Visit (HOSPITAL_COMMUNITY): Payer: Self-pay | Admitting: Psychiatry

## 2017-08-08 ENCOUNTER — Ambulatory Visit (INDEPENDENT_AMBULATORY_CARE_PROVIDER_SITE_OTHER): Payer: Medicare Other | Admitting: Psychiatry

## 2017-08-08 ENCOUNTER — Encounter (HOSPITAL_COMMUNITY): Payer: Self-pay | Admitting: Psychiatry

## 2017-08-08 VITALS — BP 119/76 | HR 80 | Ht 69.0 in | Wt 298.4 lb

## 2017-08-08 DIAGNOSIS — M6240 Contracture of muscle, unspecified site: Secondary | ICD-10-CM | POA: Diagnosis not present

## 2017-08-08 DIAGNOSIS — G894 Chronic pain syndrome: Secondary | ICD-10-CM

## 2017-08-08 DIAGNOSIS — M9903 Segmental and somatic dysfunction of lumbar region: Secondary | ICD-10-CM | POA: Diagnosis not present

## 2017-08-08 DIAGNOSIS — M546 Pain in thoracic spine: Secondary | ICD-10-CM | POA: Diagnosis not present

## 2017-08-08 DIAGNOSIS — M545 Low back pain: Secondary | ICD-10-CM | POA: Diagnosis not present

## 2017-08-08 DIAGNOSIS — M791 Myalgia, unspecified site: Secondary | ICD-10-CM | POA: Diagnosis not present

## 2017-08-08 DIAGNOSIS — F4312 Post-traumatic stress disorder, chronic: Secondary | ICD-10-CM | POA: Diagnosis not present

## 2017-08-08 DIAGNOSIS — F331 Major depressive disorder, recurrent, moderate: Secondary | ICD-10-CM

## 2017-08-08 DIAGNOSIS — M9901 Segmental and somatic dysfunction of cervical region: Secondary | ICD-10-CM | POA: Diagnosis not present

## 2017-08-08 MED ORDER — MIRTAZAPINE 15 MG PO TABS: 15.0000 mg | ORAL_TABLET | Freq: Every day | ORAL | 0 refills | Status: DC

## 2017-08-08 MED ORDER — PRAZOSIN HCL 5 MG PO CAPS: 5.0000 mg | ORAL_CAPSULE | Freq: Every day | ORAL | 0 refills | Status: DC

## 2017-08-08 MED ORDER — ARIPIPRAZOLE 2 MG PO TABS: 2.0000 mg | ORAL_TABLET | Freq: Every day | ORAL | 0 refills | Status: DC

## 2017-08-08 MED ORDER — DESVENLAFAXINE SUCCINATE ER 100 MG PO TB24: 100.0000 mg | ORAL_TABLET | Freq: Every day | ORAL | 0 refills | Status: DC

## 2017-08-08 NOTE — Patient Instructions (Signed)
Start Remeron 15 mg nightly for sleep  Continue Abilify, pristiq, and prazosin
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
## 2017-08-19 DIAGNOSIS — M546 Pain in thoracic spine: Secondary | ICD-10-CM | POA: Diagnosis not present

## 2017-08-19 DIAGNOSIS — M542 Cervicalgia: Secondary | ICD-10-CM | POA: Diagnosis not present

## 2017-08-19 DIAGNOSIS — M25551 Pain in right hip: Secondary | ICD-10-CM | POA: Diagnosis not present

## 2017-08-19 DIAGNOSIS — M545 Low back pain: Secondary | ICD-10-CM | POA: Diagnosis not present

## 2017-08-20 DIAGNOSIS — M9903 Segmental and somatic dysfunction of lumbar region: Secondary | ICD-10-CM | POA: Diagnosis not present

## 2017-08-20 DIAGNOSIS — M545 Low back pain: Secondary | ICD-10-CM | POA: Diagnosis not present

## 2017-08-20 DIAGNOSIS — M25551 Pain in right hip: Secondary | ICD-10-CM | POA: Diagnosis not present

## 2017-08-20 DIAGNOSIS — M542 Cervicalgia: Secondary | ICD-10-CM | POA: Diagnosis not present

## 2017-08-20 DIAGNOSIS — M791 Myalgia, unspecified site: Secondary | ICD-10-CM | POA: Diagnosis not present

## 2017-08-20 DIAGNOSIS — M6240 Contracture of muscle, unspecified site: Secondary | ICD-10-CM | POA: Diagnosis not present

## 2017-08-20 DIAGNOSIS — M9901 Segmental and somatic dysfunction of cervical region: Secondary | ICD-10-CM | POA: Diagnosis not present

## 2017-08-20 DIAGNOSIS — M546 Pain in thoracic spine: Secondary | ICD-10-CM | POA: Diagnosis not present

## 2017-08-22 DIAGNOSIS — M791 Myalgia, unspecified site: Secondary | ICD-10-CM | POA: Diagnosis not present

## 2017-08-22 DIAGNOSIS — M9901 Segmental and somatic dysfunction of cervical region: Secondary | ICD-10-CM | POA: Diagnosis not present

## 2017-08-22 DIAGNOSIS — M6240 Contracture of muscle, unspecified site: Secondary | ICD-10-CM | POA: Diagnosis not present

## 2017-08-22 DIAGNOSIS — M9903 Segmental and somatic dysfunction of lumbar region: Secondary | ICD-10-CM | POA: Diagnosis not present

## 2017-08-22 DIAGNOSIS — M461 Sacroiliitis, not elsewhere classified: Secondary | ICD-10-CM | POA: Diagnosis not present

## 2017-08-22 DIAGNOSIS — M5127 Other intervertebral disc displacement, lumbosacral region: Secondary | ICD-10-CM | POA: Diagnosis not present

## 2017-08-22 DIAGNOSIS — M546 Pain in thoracic spine: Secondary | ICD-10-CM | POA: Diagnosis not present

## 2017-08-22 DIAGNOSIS — M545 Low back pain: Secondary | ICD-10-CM | POA: Diagnosis not present

## 2017-08-22 DIAGNOSIS — M5124 Other intervertebral disc displacement, thoracic region: Secondary | ICD-10-CM | POA: Diagnosis not present

## 2017-08-26 DIAGNOSIS — R05 Cough: Secondary | ICD-10-CM | POA: Diagnosis not present

## 2017-08-26 DIAGNOSIS — J209 Acute bronchitis, unspecified: Secondary | ICD-10-CM | POA: Diagnosis not present

## 2017-09-04 DIAGNOSIS — M9901 Segmental and somatic dysfunction of cervical region: Secondary | ICD-10-CM | POA: Diagnosis not present

## 2017-09-04 DIAGNOSIS — M546 Pain in thoracic spine: Secondary | ICD-10-CM | POA: Diagnosis not present

## 2017-09-04 DIAGNOSIS — M6240 Contracture of muscle, unspecified site: Secondary | ICD-10-CM | POA: Diagnosis not present

## 2017-09-04 DIAGNOSIS — M9903 Segmental and somatic dysfunction of lumbar region: Secondary | ICD-10-CM | POA: Diagnosis not present

## 2017-09-04 DIAGNOSIS — F419 Anxiety disorder, unspecified: Secondary | ICD-10-CM | POA: Diagnosis not present

## 2017-09-04 DIAGNOSIS — F329 Major depressive disorder, single episode, unspecified: Secondary | ICD-10-CM | POA: Diagnosis not present

## 2017-09-04 DIAGNOSIS — F4310 Post-traumatic stress disorder, unspecified: Secondary | ICD-10-CM | POA: Diagnosis not present

## 2017-09-04 DIAGNOSIS — M791 Myalgia, unspecified site: Secondary | ICD-10-CM | POA: Diagnosis not present

## 2017-09-05 DIAGNOSIS — M9901 Segmental and somatic dysfunction of cervical region: Secondary | ICD-10-CM | POA: Diagnosis not present

## 2017-09-05 DIAGNOSIS — F419 Anxiety disorder, unspecified: Secondary | ICD-10-CM | POA: Diagnosis not present

## 2017-09-05 DIAGNOSIS — M546 Pain in thoracic spine: Secondary | ICD-10-CM | POA: Diagnosis not present

## 2017-09-05 DIAGNOSIS — M5137 Other intervertebral disc degeneration, lumbosacral region: Secondary | ICD-10-CM | POA: Diagnosis not present

## 2017-09-05 DIAGNOSIS — M791 Myalgia, unspecified site: Secondary | ICD-10-CM | POA: Diagnosis not present

## 2017-09-05 DIAGNOSIS — M6240 Contracture of muscle, unspecified site: Secondary | ICD-10-CM | POA: Diagnosis not present

## 2017-09-05 DIAGNOSIS — Z79899 Other long term (current) drug therapy: Secondary | ICD-10-CM | POA: Diagnosis not present

## 2017-09-05 DIAGNOSIS — M9903 Segmental and somatic dysfunction of lumbar region: Secondary | ICD-10-CM | POA: Diagnosis not present

## 2017-09-16 DIAGNOSIS — M9903 Segmental and somatic dysfunction of lumbar region: Secondary | ICD-10-CM | POA: Diagnosis not present

## 2017-09-16 DIAGNOSIS — M791 Myalgia, unspecified site: Secondary | ICD-10-CM | POA: Diagnosis not present

## 2017-09-16 DIAGNOSIS — M9901 Segmental and somatic dysfunction of cervical region: Secondary | ICD-10-CM | POA: Diagnosis not present

## 2017-09-16 DIAGNOSIS — M546 Pain in thoracic spine: Secondary | ICD-10-CM | POA: Diagnosis not present

## 2017-09-16 DIAGNOSIS — M6240 Contracture of muscle, unspecified site: Secondary | ICD-10-CM | POA: Diagnosis not present

## 2017-09-25 DIAGNOSIS — M5126 Other intervertebral disc displacement, lumbar region: Secondary | ICD-10-CM | POA: Diagnosis not present

## 2017-09-25 DIAGNOSIS — M546 Pain in thoracic spine: Secondary | ICD-10-CM | POA: Diagnosis not present

## 2017-09-25 DIAGNOSIS — M9901 Segmental and somatic dysfunction of cervical region: Secondary | ICD-10-CM | POA: Diagnosis not present

## 2017-09-25 DIAGNOSIS — M791 Myalgia, unspecified site: Secondary | ICD-10-CM | POA: Diagnosis not present

## 2017-09-25 DIAGNOSIS — M6240 Contracture of muscle, unspecified site: Secondary | ICD-10-CM | POA: Diagnosis not present

## 2017-09-25 DIAGNOSIS — M9903 Segmental and somatic dysfunction of lumbar region: Secondary | ICD-10-CM | POA: Diagnosis not present

## 2017-09-25 DIAGNOSIS — M5124 Other intervertebral disc displacement, thoracic region: Secondary | ICD-10-CM | POA: Diagnosis not present

## 2017-09-26 ENCOUNTER — Ambulatory Visit (HOSPITAL_COMMUNITY): Payer: Medicare Other | Admitting: Psychiatry

## 2017-10-01 DIAGNOSIS — G629 Polyneuropathy, unspecified: Secondary | ICD-10-CM | POA: Diagnosis not present

## 2017-10-01 DIAGNOSIS — Z79899 Other long term (current) drug therapy: Secondary | ICD-10-CM | POA: Diagnosis not present

## 2017-10-01 DIAGNOSIS — M5137 Other intervertebral disc degeneration, lumbosacral region: Secondary | ICD-10-CM | POA: Diagnosis not present

## 2017-10-01 DIAGNOSIS — M62830 Muscle spasm of back: Secondary | ICD-10-CM | POA: Diagnosis not present

## 2017-10-02 ENCOUNTER — Other Ambulatory Visit: Payer: Self-pay | Admitting: Pulmonary Disease

## 2017-10-02 ENCOUNTER — Ambulatory Visit (INDEPENDENT_AMBULATORY_CARE_PROVIDER_SITE_OTHER): Payer: Medicare Other | Admitting: Pulmonary Disease

## 2017-10-02 ENCOUNTER — Encounter: Payer: Self-pay | Admitting: Pulmonary Disease

## 2017-10-02 VITALS — BP 134/76 | HR 86 | Ht 69.0 in | Wt 301.4 lb

## 2017-10-02 DIAGNOSIS — J45998 Other asthma: Secondary | ICD-10-CM

## 2017-10-02 DIAGNOSIS — G4733 Obstructive sleep apnea (adult) (pediatric): Secondary | ICD-10-CM

## 2017-10-02 DIAGNOSIS — R0609 Other forms of dyspnea: Secondary | ICD-10-CM | POA: Diagnosis not present

## 2017-10-02 DIAGNOSIS — R0600 Dyspnea, unspecified: Secondary | ICD-10-CM

## 2017-10-02 MED ORDER — ALBUTEROL SULFATE HFA 108 (90 BASE) MCG/ACT IN AERS: 2.0000 | INHALATION_SPRAY | RESPIRATORY_TRACT | 5 refills | Status: DC | PRN

## 2017-10-02 MED ORDER — MOMETASONE FURO-FORMOTEROL FUM 200-5 MCG/ACT IN AERO: 2.0000 | INHALATION_SPRAY | Freq: Two times a day (BID) | RESPIRATORY_TRACT | 11 refills | Status: DC

## 2017-10-02 NOTE — Patient Instructions (Signed)
Chest xray and lab tests today Will get copy of CPAP report and arrange for new CPAP supplies Dulera two puffs twice per day Ventolin two puffs as needed for cough, wheeze, or chest congestion Will arrange for pulmonary function test  Follow up with Dr. Halford Chessman in 1 month
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
## 2017-10-03 ENCOUNTER — Telehealth: Payer: Self-pay | Admitting: Pulmonary Disease

## 2017-10-03 NOTE — Telephone Encounter (Signed)
[facility] stated that the pt had a break in therapy more than a year----they are unable to provide the pt cpap supplies until he is re-qualified----VS please advise on how you want to proceed.  Thanks

## 2017-10-03 NOTE — Telephone Encounter (Signed)
Patient's insurance will not cover [employer].Will cover Qvar and Asmanex.Wanting to know if we can send over RX for Qvar or Asmanex  Please advise thanks

## 2017-10-04 ENCOUNTER — Telehealth: Payer: Self-pay | Admitting: Pulmonary Disease

## 2017-10-04 NOTE — Telephone Encounter (Signed)
Spoke with the pharmacy and they stated that the breo, symbicort or advair would be covered by his insurance.  VS please advise which medication you would like to send to the pharmacy for the pt.  Thanks

## 2017-10-04 NOTE — Telephone Encounter (Signed)
rec'd pt's cpap DL for review Placed in VS green to do folder for review

## 2017-10-04 NOTE — Telephone Encounter (Signed)
Attempted to call patient today regarding rx recommendations. I did not receive an answer at time of call. I have left a voicemail message for pt to return call. X1

## 2017-10-04 NOTE — Telephone Encounter (Signed)
Attempted to call patient today regarding pt's formulary and VS recommendations below. I did not receive an answer at time of call. I have left a voicemail message for pt to return call. X1

## 2017-10-04 NOTE — Telephone Encounter (Signed)
Attempted to call patient today regarding VS recommendations below. I did not receive an answer at time of call. I have left a voicemail message for pt to return call. X1  

## 2017-10-04 NOTE — Telephone Encounter (Signed)
Please let pt know that for insurance coverage of CPAP therapy he needs to have a repeat sleep study.  If he is okay with this, then please schedule a home sleep study.

## 2017-10-04 NOTE — Telephone Encounter (Signed)
VS please advise if ok to change dulera to qvar or asmanex as the dulera is not covered by the pts insurance per the pharmacy

## 2017-10-04 NOTE — Telephone Encounter (Signed)
Patient returned call, CB is [phone] °

## 2017-10-04 NOTE — Telephone Encounter (Signed)
Can you check if advair, breo, wixela, or symbicort are on formulary?  Qvar and asmanex are not equivalent substitutions for dulera.

## 2017-10-04 NOTE — Telephone Encounter (Signed)
Asmanex and Qvar are not suitable substitutes for dulera.  Please check if breo, symbicort, advair, or wixela are on his formulary.

## 2017-10-07 ENCOUNTER — Other Ambulatory Visit: Payer: Self-pay | Admitting: Pulmonary Disease

## 2017-10-07 MED ORDER — BUDESONIDE-FORMOTEROL FUMARATE 160-4.5 MCG/ACT IN AERO: 2.0000 | INHALATION_SPRAY | Freq: Two times a day (BID) | RESPIRATORY_TRACT | 11 refills | Status: DC

## 2017-10-07 NOTE — Telephone Encounter (Signed)
Attempted to call patient today regarding VS recommendations below. I did not receive an answer at time of call. I have left a voicemail message forptto return call. X2

## 2017-10-07 NOTE — Telephone Encounter (Signed)
Can change to symbicort 160 strength, two puffs bid.

## 2017-10-07 NOTE — Telephone Encounter (Signed)
Sent Rx of Symbicort 160 to pt's preferred pharmacy and discontinued Dulera off of pt's med list as it is not a covered inhaler per pt's insurance.  Called pt to let him know this. Pt expressed understanding. Nothing further needed.

## 2017-10-08 NOTE — Telephone Encounter (Signed)
lmtcb x2 for pt. 

## 2017-10-09 NOTE — Telephone Encounter (Signed)
Auto CPAP 09/03/17 to 10/02/17 >> used on 28 of 30 nights with average 6 hrs 27 min.  Average AHI 0.6 with median CPAP 7 and 95 th percentile CPAP 9 cm H2O.   Please let him know that CPAP report shows good control of sleep apnea with current settings.

## 2017-10-09 NOTE — Telephone Encounter (Signed)
Called and spoke with patient regarding results.  Informed the patient of results and recommendations today. Pt verbalized understanding and denied any questions or concerns at this time.  Nothing further needed.  

## 2017-10-10 NOTE — Telephone Encounter (Signed)
Called and spoke with patient regarding results.  Informed the patient of results and recommendations today. Pt verbalized understanding and denied any questions or concerns at this time.  Nothing further needed.  

## 2017-10-30 ENCOUNTER — Other Ambulatory Visit (INDEPENDENT_AMBULATORY_CARE_PROVIDER_SITE_OTHER): Payer: Medicare Other

## 2017-10-30 ENCOUNTER — Ambulatory Visit (INDEPENDENT_AMBULATORY_CARE_PROVIDER_SITE_OTHER)
Admission: RE | Admit: 2017-10-30 | Discharge: 2017-10-30 | Disposition: A | Discharge status: Home / Self Care | Payer: Medicare Other | Source: Ambulatory Visit | Attending: Pulmonary Disease | Admitting: Pulmonary Disease

## 2017-10-30 DIAGNOSIS — R0609 Other forms of dyspnea: Secondary | ICD-10-CM | POA: Diagnosis not present

## 2017-10-30 DIAGNOSIS — R0600 Dyspnea, unspecified: Secondary | ICD-10-CM

## 2017-10-30 DIAGNOSIS — R0602 Shortness of breath: Secondary | ICD-10-CM | POA: Diagnosis not present

## 2017-10-30 LAB — COMPREHENSIVE METABOLIC PANEL
ALT: 18 U/L (ref 0–53)
AST: 15 U/L (ref 0–37)
Albumin: 4.3 g/dL (ref 3.5–5.2)
Alkaline Phosphatase: 60 U/L (ref 39–117)
BUN: 14 mg/dL (ref 6–23)
CO2: 25 mEq/L (ref 19–32)
Calcium: 9.4 mg/dL (ref 8.4–10.5)
Chloride: 106 mEq/L (ref 96–112)
Creatinine, Ser: 1.08 mg/dL (ref 0.40–1.50)
GFR: 93.18 mL/min (ref >60.00)
Glucose, Bld: 89 mg/dL (ref 70–99)
Potassium: 4.2 mEq/L (ref 3.5–5.1)
Sodium: 138 mEq/L (ref 135–145)
Total Bilirubin: 0.4 mg/dL (ref 0.2–1.2)
Total Protein: 7.7 g/dL (ref 6.0–8.3)

## 2017-10-30 LAB — CBC WITH DIFFERENTIAL/PLATELET
Basophils Absolute: 0.1 10*3/uL (ref 0.0–0.1)
Basophils Relative: 1.7 % (ref 0.0–3.0)
Eosinophils Absolute: 0.1 10*3/uL (ref 0.0–0.7)
Eosinophils Relative: 3.2 % (ref 0.0–5.0)
HCT: 46 % (ref 39.0–52.0)
Hemoglobin: 15.3 g/dL (ref 13.0–17.0)
Lymphocytes Relative: 38.2 % (ref 12.0–46.0)
Lymphs Abs: 1.5 10*3/uL (ref 0.7–4.0)
MCHC: 33.2 g/dL (ref 30.0–36.0)
MCV: 85.2 fl (ref 78.0–100.0)
Monocytes Absolute: 0.4 10*3/uL (ref 0.1–1.0)
Monocytes Relative: 10.9 % (ref 3.0–12.0)
Neutro Abs: 1.8 10*3/uL (ref 1.4–7.7)
Neutrophils Relative %: 46 % (ref 43.0–77.0)
Platelets: 149 10*3/uL — ABNORMAL LOW (ref 150.0–400.0)
RBC: 5.4 Mil/uL (ref 4.22–5.81)
RDW: 14.6 % (ref 11.5–15.5)
WBC: 4 10*3/uL (ref 4.0–10.5)

## 2017-10-31 ENCOUNTER — Telehealth: Payer: Self-pay | Admitting: Pulmonary Disease

## 2017-10-31 NOTE — Telephone Encounter (Signed)
Dg Chest 2 View  Result Date: 10/30/2017 CLINICAL DATA:  Shortness of breath, dyspnea on exertion for 4 weeks EXAM: CHEST - 2 VIEW COMPARISON:  02/04/2017 FINDINGS: Normal heart size, mediastinal contours, and pulmonary vascularity. Lungs clear. No pulmonary infiltrate, pleural effusion or pneumothorax. Osseous mineralization normal. Multilevel endplate spur formation thoracic spine. IMPRESSION: No acute abnormalities. Electronically Signed   By: Lavonia Dana M.D.   On: 10/30/2017 14:06     CBC Latest Ref Rng & Units 10/30/2017 02/04/2017 11/28/2015  WBC 4.0 - 10.5 K/uL 4.0 3.8(L) 3.6(L)  Hemoglobin 13.0 - 17.0 g/dL 15.3 14.3 15.5  Hematocrit 39.0 - 52.0 % 46.0 42.0 45.2  Platelets 150.0 - 400.0 K/uL 149.0(L) 132(L) 144(L)    CMP Latest Ref Rng & Units 10/30/2017 02/04/2017 11/28/2015  Glucose 70 - 99 mg/dL 89 90 88  BUN 6 - 23 mg/dL 14 10 9   Creatinine 0.40 - 1.50 mg/dL 1.08 1.04 0.95  Sodium 135 - 145 mEq/L 138 137 138  Potassium 3.5 - 5.1 mEq/L 4.2 3.7 3.8  Chloride 96 - 112 mEq/L 106 108 105  CO2 19 - 32 mEq/L 25 23 26   Calcium 8.4 - 10.5 mg/dL 9.4 8.5(L) 8.9  Total Protein 6.0 - 8.3 g/dL 7.7 - 7.8  Total Bilirubin 0.2 - 1.2 mg/dL 0.4 - 0.4  Alkaline Phos 39 - 117 U/L 60 - 58  AST 0 - 37 U/L 15 - 22  ALT 0 - 53 U/L 18 - 23     Please let him know his labs and CXR were normal.

## 2017-11-04 DIAGNOSIS — M62830 Muscle spasm of back: Secondary | ICD-10-CM | POA: Diagnosis not present

## 2017-11-04 DIAGNOSIS — F4310 Post-traumatic stress disorder, unspecified: Secondary | ICD-10-CM | POA: Diagnosis not present

## 2017-11-04 DIAGNOSIS — Z79899 Other long term (current) drug therapy: Secondary | ICD-10-CM | POA: Diagnosis not present

## 2017-11-04 DIAGNOSIS — M5137 Other intervertebral disc degeneration, lumbosacral region: Secondary | ICD-10-CM | POA: Diagnosis not present

## 2017-11-04 DIAGNOSIS — G629 Polyneuropathy, unspecified: Secondary | ICD-10-CM | POA: Diagnosis not present

## 2017-11-04 NOTE — Telephone Encounter (Signed)
Attempted to call patient today regarding results. I did not receive an answer at time of call. I have left a voicemail message for pt to return call. X1  

## 2017-11-05 NOTE — Telephone Encounter (Signed)
Attempted to call patient today regarding results, VM is full no message left. X2

## 2017-11-06 ENCOUNTER — Ambulatory Visit (INDEPENDENT_AMBULATORY_CARE_PROVIDER_SITE_OTHER): Payer: Medicare Other | Admitting: Pulmonary Disease

## 2017-11-06 ENCOUNTER — Encounter: Payer: Self-pay | Admitting: Pulmonary Disease

## 2017-11-06 VITALS — BP 114/68 | HR 78 | Ht 68.5 in | Wt 298.0 lb

## 2017-11-06 DIAGNOSIS — R0600 Dyspnea, unspecified: Secondary | ICD-10-CM

## 2017-11-06 DIAGNOSIS — J45998 Other asthma: Secondary | ICD-10-CM

## 2017-11-06 DIAGNOSIS — R0609 Other forms of dyspnea: Secondary | ICD-10-CM

## 2017-11-06 DIAGNOSIS — Z9989 Dependence on other enabling machines and devices: Secondary | ICD-10-CM

## 2017-11-06 DIAGNOSIS — J984 Other disorders of lung: Secondary | ICD-10-CM | POA: Diagnosis not present

## 2017-11-06 DIAGNOSIS — G4733 Obstructive sleep apnea (adult) (pediatric): Secondary | ICD-10-CM | POA: Diagnosis not present

## 2017-11-06 LAB — PULMONARY FUNCTION TEST
DL/VA % pred: 120 %
DL/VA: 5.5 ml/min/mmHg/L
DLCO cor % pred: 96 %
DLCO cor: 29.26 ml/min/mmHg
DLCO unc % pred: 98 %
DLCO unc: 29.82 ml/min/mmHg
FEF 25-75 Post: 4.54 L/sec
FEF 25-75 Pre: 4.11 L/sec
FEF2575-%Change-Post: 10 %
FEF2575-%Pred-Post: 141 %
FEF2575-%Pred-Pre: 127 %
FEV1-%Change-Post: 6 %
FEV1-%Pred-Post: 106 %
FEV1-%Pred-Pre: 99 %
FEV1-Post: 3.39 L
FEV1-Pre: 3.17 L
FEV1FVC-%Change-Post: 1 %
FEV1FVC-%Pred-Pre: 110 %
FEV6-%Change-Post: 5 %
FEV6-%Pred-Post: 97 %
FEV6-%Pred-Pre: 93 %
FEV6-Post: 3.79 L
FEV6-Pre: 3.6 L
FEV6FVC-%Change-Post: 0 %
FEV6FVC-%Pred-Post: 102 %
FEV6FVC-%Pred-Pre: 103 %
FVC-%Change-Post: 5 %
FVC-%Pred-Post: 95 %
FVC-%Pred-Pre: 90 %
FVC-Post: 3.79 L
FVC-Pre: 3.6 L
Post FEV1/FVC ratio: 89 %
Post FEV6/FVC ratio: 100 %
Pre FEV1/FVC ratio: 88 %
Pre FEV6/FVC Ratio: 100 %

## 2017-11-06 MED ORDER — BUDESONIDE-FORMOTEROL FUMARATE 160-4.5 MCG/ACT IN AERO: 2.0000 | INHALATION_SPRAY | Freq: Two times a day (BID) | RESPIRATORY_TRACT | 0 refills | Status: DC

## 2017-11-06 MED ORDER — ALPRAZOLAM 0.5 MG PO TABS: ORAL_TABLET | ORAL | 0 refills | Status: DC
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
## 2017-11-06 NOTE — Telephone Encounter (Signed)
Pt was in office for OV and PFT Informed the patient of results and recommendations today. Pt verbalized understanding and denied any questions or concerns at this time.  Nothing further needed.

## 2017-11-06 NOTE — Patient Instructions (Signed)
Will arrange for refitting of your CPAP mask Will arrange for overnight oxygen test with you using CPAP at home Will schedule high resolution CT chest You can take alprazolam (xanax) 0.5 mg as needed prior to having CT chest done  Follow up in 6 weeks with Dr. Halford Chessman or Nurse Practitioner

## 2017-11-06 NOTE — Progress Notes (Signed)
Patient completed full PFT today. Patient was not able to complete Pleth, therefore completed Nitrogen Washout today.

## 2017-11-14 DIAGNOSIS — M1712 Unilateral primary osteoarthritis, left knee: Secondary | ICD-10-CM | POA: Diagnosis not present

## 2017-11-14 DIAGNOSIS — M25562 Pain in left knee: Secondary | ICD-10-CM | POA: Diagnosis not present

## 2017-11-15 DIAGNOSIS — S43431D Superior glenoid labrum lesion of right shoulder, subsequent encounter: Secondary | ICD-10-CM | POA: Diagnosis not present

## 2017-11-15 DIAGNOSIS — M7062 Trochanteric bursitis, left hip: Secondary | ICD-10-CM | POA: Diagnosis not present

## 2017-11-15 DIAGNOSIS — M25551 Pain in right hip: Secondary | ICD-10-CM | POA: Diagnosis not present

## 2017-11-15 DIAGNOSIS — M25562 Pain in left knee: Secondary | ICD-10-CM | POA: Diagnosis not present

## 2017-11-15 DIAGNOSIS — Z6841 Body Mass Index (BMI) 40.0 and over, adult: Secondary | ICD-10-CM | POA: Diagnosis not present

## 2017-11-20 ENCOUNTER — Telehealth: Payer: Self-pay | Admitting: Pulmonary Disease

## 2017-11-20 NOTE — Telephone Encounter (Signed)
[name] has the paperwork. Will route message to her for follow up.

## 2017-11-21 ENCOUNTER — Ambulatory Visit (INDEPENDENT_AMBULATORY_CARE_PROVIDER_SITE_OTHER)
Admission: RE | Admit: 2017-11-21 | Discharge: 2017-11-21 | Disposition: A | Discharge status: Home / Self Care | Payer: Medicare Other | Source: Ambulatory Visit | Attending: Pulmonary Disease | Admitting: Pulmonary Disease

## 2017-11-21 DIAGNOSIS — J984 Other disorders of lung: Secondary | ICD-10-CM

## 2017-11-21 DIAGNOSIS — R0609 Other forms of dyspnea: Secondary | ICD-10-CM

## 2017-11-21 DIAGNOSIS — R0600 Dyspnea, unspecified: Secondary | ICD-10-CM

## 2017-11-21 DIAGNOSIS — R942 Abnormal results of pulmonary function studies: Secondary | ICD-10-CM | POA: Diagnosis not present

## 2017-11-22 NOTE — Telephone Encounter (Signed)
Paperwork is in VS green to do folder for review. VS is out of the office until 11/25/17, will be completed at that time

## 2017-11-26 NOTE — Telephone Encounter (Signed)
VS please advise when paperwork has been reviewed, completed and signed. Thank you.

## 2017-11-27 NOTE — Telephone Encounter (Signed)
Form has been faxed to [facility] phone [phone]; fax [phone] Nothing further needed.  Attempted to call patient today regarding paperwork faxed. I did not receive an answer at time of call. I have left a voicemail message for pt to return call. X1

## 2017-11-27 NOTE — Telephone Encounter (Signed)
Form completed.

## 2017-11-28 DIAGNOSIS — F4310 Post-traumatic stress disorder, unspecified: Secondary | ICD-10-CM | POA: Diagnosis not present

## 2017-11-28 NOTE — Telephone Encounter (Signed)
Informed patient, that we had faxed his paperwork. Patient also asked about his CT scan results. Informed patient we were waiting for VS to review. Voiced understanding.   VS please advise of CT results. Thanks.

## 2017-11-28 NOTE — Telephone Encounter (Signed)
Attempted to call pt. I did not receive an answer. I have left a message for pt to return our call.  

## 2017-11-29 NOTE — Telephone Encounter (Signed)
Patient returned call, CB is [phone] °

## 2017-11-29 NOTE — Telephone Encounter (Signed)
HRCT chest 11/21/17 >> minimal air trapping, b/l renal stones, Rt adrenal adenoma   Please let him know his CT chest shows expected changes with history of asthma.  No other worrisome findings.  He should continue symbicort and pnr albuterol.

## 2017-11-29 NOTE — Telephone Encounter (Signed)
LMTCB

## 2017-11-29 NOTE — Telephone Encounter (Signed)
Pt is aware of results and no further questions at this time.

## 2017-12-03 DIAGNOSIS — Z79899 Other long term (current) drug therapy: Secondary | ICD-10-CM | POA: Diagnosis not present

## 2017-12-03 DIAGNOSIS — M5137 Other intervertebral disc degeneration, lumbosacral region: Secondary | ICD-10-CM | POA: Diagnosis not present

## 2017-12-03 DIAGNOSIS — M62830 Muscle spasm of back: Secondary | ICD-10-CM | POA: Diagnosis not present

## 2017-12-18 ENCOUNTER — Encounter: Payer: Self-pay | Admitting: Pulmonary Disease

## 2017-12-18 ENCOUNTER — Ambulatory Visit (INDEPENDENT_AMBULATORY_CARE_PROVIDER_SITE_OTHER): Payer: Medicare Other | Admitting: Pulmonary Disease

## 2017-12-18 VITALS — BP 112/70 | HR 94 | Ht 69.0 in | Wt 303.0 lb

## 2017-12-18 DIAGNOSIS — E669 Obesity, unspecified: Secondary | ICD-10-CM | POA: Diagnosis not present

## 2017-12-18 DIAGNOSIS — J45998 Other asthma: Secondary | ICD-10-CM

## 2017-12-18 DIAGNOSIS — Z9989 Dependence on other enabling machines and devices: Secondary | ICD-10-CM

## 2017-12-18 DIAGNOSIS — G4730 Sleep apnea, unspecified: Secondary | ICD-10-CM

## 2017-12-18 DIAGNOSIS — G4733 Obstructive sleep apnea (adult) (pediatric): Secondary | ICD-10-CM | POA: Diagnosis not present

## 2017-12-18 NOTE — Patient Instructions (Signed)
Will have you talk to patient care coordinators about scheduling overnight oxygen test with CPAP and getting CPAP mask refitted  Flu shot today  Follow up in 6 months
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
## 2017-12-19 ENCOUNTER — Other Ambulatory Visit: Payer: Self-pay | Admitting: Orthopedic Surgery

## 2017-12-19 DIAGNOSIS — Z6841 Body Mass Index (BMI) 40.0 and over, adult: Secondary | ICD-10-CM | POA: Diagnosis not present

## 2017-12-19 DIAGNOSIS — M25571 Pain in right ankle and joints of right foot: Secondary | ICD-10-CM

## 2017-12-26 ENCOUNTER — Ambulatory Visit
Admission: RE | Admit: 2017-12-26 | Discharge: 2017-12-26 | Disposition: A | Discharge status: Home / Self Care | Payer: Medicare Other | Source: Ambulatory Visit | Attending: Orthopedic Surgery | Admitting: Orthopedic Surgery

## 2017-12-26 DIAGNOSIS — M19071 Primary osteoarthritis, right ankle and foot: Secondary | ICD-10-CM | POA: Diagnosis not present

## 2017-12-26 DIAGNOSIS — F4310 Post-traumatic stress disorder, unspecified: Secondary | ICD-10-CM | POA: Diagnosis not present

## 2017-12-26 DIAGNOSIS — M25571 Pain in right ankle and joints of right foot: Secondary | ICD-10-CM

## 2017-12-31 DIAGNOSIS — Z6841 Body Mass Index (BMI) 40.0 and over, adult: Secondary | ICD-10-CM | POA: Diagnosis not present

## 2017-12-31 DIAGNOSIS — M25571 Pain in right ankle and joints of right foot: Secondary | ICD-10-CM | POA: Diagnosis not present

## 2018-01-03 DIAGNOSIS — M549 Dorsalgia, unspecified: Secondary | ICD-10-CM | POA: Diagnosis not present

## 2018-01-03 DIAGNOSIS — Z79899 Other long term (current) drug therapy: Secondary | ICD-10-CM | POA: Diagnosis not present

## 2018-01-09 DIAGNOSIS — M25562 Pain in left knee: Secondary | ICD-10-CM | POA: Diagnosis not present

## 2018-01-13 DIAGNOSIS — M1712 Unilateral primary osteoarthritis, left knee: Secondary | ICD-10-CM | POA: Diagnosis not present

## 2018-01-13 DIAGNOSIS — M25562 Pain in left knee: Secondary | ICD-10-CM | POA: Diagnosis not present

## 2018-01-28 ENCOUNTER — Telehealth: Payer: Self-pay | Admitting: Pulmonary Disease

## 2018-01-28 DIAGNOSIS — G4733 Obstructive sleep apnea (adult) (pediatric): Secondary | ICD-10-CM

## 2018-01-28 NOTE — Telephone Encounter (Signed)
ONO with CPAP 01/21/18 >> tet time 9 hrs 21 min.  Basal SpO2 94%, low SpO2 81%.  Spent 14 min with SpO2 < 88%.   Please let him know that ONO shows low oxygen at night even though he is using CPAP.  He needs to have an in lab CPAP titration study.  If he is okay with this plan, then please schedule CPAP titration study.

## 2018-01-29 NOTE — Telephone Encounter (Signed)
Attempted to call patient today regarding results. I did not receive an answer at time of call. I have left a voicemail message for pt to return call. X1  

## 2018-01-31 NOTE — Telephone Encounter (Signed)
Patient returned call, CB is [phone]

## 2018-01-31 NOTE — Telephone Encounter (Signed)
atc pt, line rang several times and then was ended.  atc back, line rang to fast busy signal.  Wcb.

## 2018-01-31 NOTE — Telephone Encounter (Signed)
lmtcb x2 for pt. 

## 2018-02-03 DIAGNOSIS — Z79899 Other long term (current) drug therapy: Secondary | ICD-10-CM | POA: Diagnosis not present

## 2018-02-03 DIAGNOSIS — M549 Dorsalgia, unspecified: Secondary | ICD-10-CM | POA: Diagnosis not present

## 2018-02-03 NOTE — Telephone Encounter (Signed)
Called and spoke with Patient. Dr. Juanetta Gosling ONO results and recommendations given.  Patient stated understanding. CPAP titration study ordered.  Nothing further at this time.

## 2018-02-04 ENCOUNTER — Ambulatory Visit (INDEPENDENT_AMBULATORY_CARE_PROVIDER_SITE_OTHER): Payer: Medicare Other | Admitting: Adult Health

## 2018-02-04 ENCOUNTER — Encounter: Payer: Self-pay | Admitting: Adult Health

## 2018-02-04 DIAGNOSIS — R0600 Dyspnea, unspecified: Secondary | ICD-10-CM

## 2018-02-04 DIAGNOSIS — J45998 Other asthma: Secondary | ICD-10-CM

## 2018-02-04 NOTE — Assessment & Plan Note (Signed)
Wt loss  

## 2018-02-04 NOTE — Assessment & Plan Note (Signed)
Appears controlled without flare  Suspect dyspnea is multifactoral , will need CPAP titration . Suspect weight and deconditioning are contributing factors as well.   Plan  Patient Instructions  Oxgyen test with CPAP 01/21/18 >> te st time 9 hrs 21 min.   Average oxygen was 94%, lowest was 81%.  Spent 14 min with  < 88%. (normal oxygen level is >88-90%) .  Continue on Symbicort 2 puffs Twice daily   Go for CPAP titration study .  Activity as tolerated.  Follow up with Dr. Halford Chessman  6-8 weeks and As needed    Please contact office for sooner follow up if symptoms do not improve or worsen or seek emergency care

## 2018-02-04 NOTE — Patient Instructions (Addendum)
Oxgyen test with CPAP 01/21/18 >> test time 9 hrs 21 min.  Average oxygen was 94%, lowest was 81%.  Spent 14 min with  < 88%. (normal oxygen level is >88-90%) .  Continue on Symbicort 2 puffs Twice daily   Go for CPAP titration study .  Activity as tolerated.  Follow up with Dr. Halford Chessman  6-8 weeks and As needed    Please contact office for sooner follow up if symptoms do not improve or worsen or seek emergency care
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
## 2018-02-04 NOTE — Assessment & Plan Note (Signed)
Set up CPAP titration  Wt loss  Activity as tolerated.   Plan  Patient Instructions  Oxgyen test with CPAP 01/21/18 >> te st time 9 hrs 21 min.   Average oxygen was 94%, lowest was 81%.  Spent 14 min with  < 88%. (normal oxygen level is >88-90%) .  Continue on Symbicort 2 puffs Twice daily   Go for CPAP titration study .  Activity as tolerated.  Follow up with Dr. Halford Chessman  6-8 weeks and As needed    Please contact office for sooner follow up if symptoms do not improve or worsen or seek emergency care

## 2018-02-18 NOTE — Progress Notes (Signed)
Reviewed and agree with assessment/plan.   Shariq Puig, MD [facility] 03/07/2016, 12:24 PM Pager:  [phone]  

## 2018-02-20 DIAGNOSIS — M6240 Contracture of muscle, unspecified site: Secondary | ICD-10-CM | POA: Diagnosis not present

## 2018-02-20 DIAGNOSIS — M546 Pain in thoracic spine: Secondary | ICD-10-CM | POA: Diagnosis not present

## 2018-02-20 DIAGNOSIS — M5126 Other intervertebral disc displacement, lumbar region: Secondary | ICD-10-CM | POA: Diagnosis not present

## 2018-02-20 DIAGNOSIS — M9901 Segmental and somatic dysfunction of cervical region: Secondary | ICD-10-CM | POA: Diagnosis not present

## 2018-02-20 DIAGNOSIS — M791 Myalgia, unspecified site: Secondary | ICD-10-CM | POA: Diagnosis not present

## 2018-02-20 DIAGNOSIS — M5124 Other intervertebral disc displacement, thoracic region: Secondary | ICD-10-CM | POA: Diagnosis not present

## 2018-02-20 DIAGNOSIS — M9903 Segmental and somatic dysfunction of lumbar region: Secondary | ICD-10-CM | POA: Diagnosis not present

## 2018-02-25 DIAGNOSIS — M9901 Segmental and somatic dysfunction of cervical region: Secondary | ICD-10-CM | POA: Diagnosis not present

## 2018-02-25 DIAGNOSIS — M546 Pain in thoracic spine: Secondary | ICD-10-CM | POA: Diagnosis not present

## 2018-02-25 DIAGNOSIS — M6240 Contracture of muscle, unspecified site: Secondary | ICD-10-CM | POA: Diagnosis not present

## 2018-02-25 DIAGNOSIS — M9903 Segmental and somatic dysfunction of lumbar region: Secondary | ICD-10-CM | POA: Diagnosis not present

## 2018-02-25 DIAGNOSIS — M791 Myalgia, unspecified site: Secondary | ICD-10-CM | POA: Diagnosis not present

## 2018-02-27 DIAGNOSIS — M549 Dorsalgia, unspecified: Secondary | ICD-10-CM | POA: Diagnosis not present

## 2018-02-27 DIAGNOSIS — M25511 Pain in right shoulder: Secondary | ICD-10-CM | POA: Diagnosis not present

## 2018-02-27 DIAGNOSIS — M542 Cervicalgia: Secondary | ICD-10-CM | POA: Diagnosis not present

## 2018-02-27 DIAGNOSIS — G629 Polyneuropathy, unspecified: Secondary | ICD-10-CM | POA: Diagnosis not present

## 2018-02-27 DIAGNOSIS — M25519 Pain in unspecified shoulder: Secondary | ICD-10-CM | POA: Diagnosis not present

## 2018-02-27 DIAGNOSIS — Z79899 Other long term (current) drug therapy: Secondary | ICD-10-CM | POA: Diagnosis not present

## 2018-03-11 DIAGNOSIS — M9903 Segmental and somatic dysfunction of lumbar region: Secondary | ICD-10-CM | POA: Diagnosis not present

## 2018-03-11 DIAGNOSIS — M546 Pain in thoracic spine: Secondary | ICD-10-CM | POA: Diagnosis not present

## 2018-03-11 DIAGNOSIS — M791 Myalgia, unspecified site: Secondary | ICD-10-CM | POA: Diagnosis not present

## 2018-03-11 DIAGNOSIS — M9901 Segmental and somatic dysfunction of cervical region: Secondary | ICD-10-CM | POA: Diagnosis not present

## 2018-03-11 DIAGNOSIS — M6240 Contracture of muscle, unspecified site: Secondary | ICD-10-CM | POA: Diagnosis not present

## 2018-03-18 ENCOUNTER — Encounter: Payer: Self-pay | Admitting: Pulmonary Disease

## 2018-03-18 ENCOUNTER — Ambulatory Visit (INDEPENDENT_AMBULATORY_CARE_PROVIDER_SITE_OTHER): Payer: Medicare Other | Admitting: Pulmonary Disease

## 2018-03-18 VITALS — BP 120/68 | HR 80 | Ht 69.0 in | Wt 311.4 lb

## 2018-03-18 DIAGNOSIS — G4733 Obstructive sleep apnea (adult) (pediatric): Secondary | ICD-10-CM

## 2018-03-18 DIAGNOSIS — J45998 Other asthma: Secondary | ICD-10-CM | POA: Diagnosis not present

## 2018-03-18 DIAGNOSIS — E669 Obesity, unspecified: Secondary | ICD-10-CM

## 2018-03-18 DIAGNOSIS — G4730 Sleep apnea, unspecified: Secondary | ICD-10-CM

## 2018-03-18 MED ORDER — ZOLPIDEM TARTRATE 10 MG PO TABS: 10.0000 mg | ORAL_TABLET | Freq: Every evening | ORAL | 0 refills | Status: DC | PRN

## 2018-03-18 MED ORDER — ZOLPIDEM TARTRATE 5 MG PO TABS: 5.0000 mg | ORAL_TABLET | Freq: Every evening | ORAL | 0 refills | Status: DC | PRN
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
## 2018-03-18 NOTE — Patient Instructions (Signed)
Will call with results of CPAP study  Follow up in 4 months

## 2018-03-24 ENCOUNTER — Telehealth: Payer: Self-pay

## 2018-03-24 NOTE — Telephone Encounter (Signed)
Spoke with libby in [facility] regarding below message from VS In regards to insurance not covering this She advised that she will call them this week. Will f/u with [facility] later date

## 2018-03-24 NOTE — Telephone Encounter (Signed)
-----   Message from [name], MD sent at 03/21/2018  8:32 AM EST ----- These people are truly clueless.    He is already on auto CPAP!  He had overnight oximetry on auto CPAP and had low oxygen level.  He needs to have in lab titration study to determine if he qualifies for home oxygen with auto CPAP, or if he needs to change to Bipap.  These can not be determined by home testing.  Please provide this information to [facility].   V ----- Message ----- From: Joellen Jersey Sent: 03/20/2018   8:31 AM EST To: [name], MD, Georjean Mode, [place] denied titration study pt will have to try auto cpap first Joellen Jersey

## 2018-03-25 ENCOUNTER — Ambulatory Visit (HOSPITAL_BASED_OUTPATIENT_CLINIC_OR_DEPARTMENT_OTHER): Payer: Medicare Other | Attending: Pulmonary Disease | Admitting: Pulmonary Disease

## 2018-03-25 VITALS — Ht 69.0 in | Wt 289.0 lb

## 2018-03-25 DIAGNOSIS — G4733 Obstructive sleep apnea (adult) (pediatric): Secondary | ICD-10-CM | POA: Diagnosis not present

## 2018-03-31 NOTE — Telephone Encounter (Signed)
Tried to call [facility] at [phone] regarding patient's in lab titration.  There office is closed today due to Holiday today. Will try tomorrow Pt had is already on auto CPAP!  He had overnight oximetry on auto CPAP and had low oxygen level.   He needs to have in lab titration study to determine if he qualifies for home oxygen with auto CPAP,  or if he needs to change to Bipap.  These can not be determined by home testing

## 2018-04-01 NOTE — Telephone Encounter (Signed)
LVM with [employer] general VM box at [phone] regarding patient's in lab titration.  Pt is already on auto CPAP! He had overnight oximetry on auto CPAP and had low oxygen level.  He needs to have in lab titration study to determine if he qualifies for home oxygen with auto CPAP,  or if he needs to change to Bipap. These can not be determined by home testing

## 2018-04-02 NOTE — Telephone Encounter (Addendum)
Called and spoke with Amy with [facility] in the denial dept at phone [phone] Patient has been denied by insurance to have in lab titration that was ordered by VS She is faxing copy of the denial letter; she states the in lab titration study is not medically necessary. I explained to her that Pt isalready on auto CPAP! He had overnight oximetry on auto CPAP and had low oxygen level.  He needs to have in lab titration study to determine if he qualifies for home oxygen with auto CPAP,  or if he needs to change to Bipap. These can not be determined by home testing She stated that VS needs to call her at [phone] and schedule a peer to peer.  VS, please call [employer] to conduct a peer to peer for this patient at phone [phone]. Case number is [id number]

## 2018-04-02 NOTE — Telephone Encounter (Signed)
Spoke with coordinator.  Have scheduled peer to peer at 12:20 pm with Dr. Fabio Bering

## 2018-04-03 NOTE — Telephone Encounter (Signed)
I completed Peer to Peer with Dr. Fabio Bering.  I explained that patient had recent weight gain, has been on auto CPAP, and had recent overnight oximetry with CPAP that showed oxygen desaturation.  He has approved CPAP titration study.  He has requested that most recent 30 days CPAP download be faxed to him.  Please fax to [phone], Attention Dr. Fabio Bering.

## 2018-04-04 ENCOUNTER — Telehealth: Payer: Self-pay | Admitting: Pulmonary Disease

## 2018-04-04 NOTE — Telephone Encounter (Signed)
atc the phone was answered but no one responded x2 wcb

## 2018-04-04 NOTE — Telephone Encounter (Signed)
VS completed peer to peer with Dr. Fabio Bering MD; it's approved.  He has requested that most recent 30 days CPAP download be faxed to him. Please fax to [phone], Attention Dr. Fabio Bering. Faxed the DL today to above fax and attn. Nothing further needed.

## 2018-04-07 ENCOUNTER — Ambulatory Visit (INDEPENDENT_AMBULATORY_CARE_PROVIDER_SITE_OTHER): Payer: Medicare Other | Admitting: Pulmonary Disease

## 2018-04-07 ENCOUNTER — Encounter: Payer: Self-pay | Admitting: Pulmonary Disease

## 2018-04-07 VITALS — BP 128/72 | HR 89 | Ht 69.0 in | Wt 307.4 lb

## 2018-04-07 DIAGNOSIS — Z9989 Dependence on other enabling machines and devices: Secondary | ICD-10-CM

## 2018-04-07 DIAGNOSIS — G4733 Obstructive sleep apnea (adult) (pediatric): Secondary | ICD-10-CM

## 2018-04-07 DIAGNOSIS — J45998 Other asthma: Secondary | ICD-10-CM | POA: Diagnosis not present

## 2018-04-07 DIAGNOSIS — J984 Other disorders of lung: Secondary | ICD-10-CM

## 2018-04-07 NOTE — Patient Instructions (Signed)
Will change your auto CPAP to 7 - 15 cm H2O  Follow up in 4 months

## 2018-04-07 NOTE — Telephone Encounter (Signed)
He is in office for ROV right now.

## 2018-04-07 NOTE — Procedures (Signed)
    Patient Name: [name], [name] Date: 03/25/2018   Gender: Male  D.O.B: Apr 20, 1965  Age (years): 42  Referring Provider: [name] MD, ABSM  Height (inches): 69  Interpreting Physician: [name] MD, ABSM  Weight (lbs): 289  RPSGT: Laren Everts  BMI: 43  MRN: [id number]  Neck Size: 19.00  <br> <br>  CLINICAL INFORMATION  The patient is referred for a CPAP titration to treat sleep apnea. He has history of sleep apnea on CPAP. Had recent weight gain. Overnight oximetry with CPAP showed low oxygen.  SLEEP STUDY TECHNIQUE  As per the AASM Manual for the Scoring of Sleep and Associated Events v2.3 (April 2016) with a hypopnea requiring 4% desaturations. The channels recorded and monitored were frontal, central and occipital EEG, electrooculogram (EOG), submentalis EMG (chin), nasal and oral airflow, thoracic and abdominal wall motion, anterior tibialis EMG, snore microphone, electrocardiogram, and pulse oximetry. Continuous positive airway pressure (CPAP) was initiated at the beginning of the study and titrated to treat sleep-disordered breathing. MEDICATIONS  Medications self-administered by patient taken the night of the study : AMBIEN, MELOXICAM, GABAPENTIN TECHNICIAN COMMENTS  Comments added by technician: Patient had difficulty initiating sleep. Patient was restless all through the night. Comments added by scorer: N/A  RESPIRATORY PARAMETERS  Optimal PAP Pressure (cm): 8 AHI at Optimal Pressure (/hr): 0.9  Overall Minimal O2 (%): 87.0 Supine % at Optimal Pressure (%): 69  Minimal O2 at Optimal Pressure (%): 92.0      SLEEP ARCHITECTURE  The study was initiated at 9:12:34 PM and ended at 4:46:08 AM. Sleep onset time was 95.3 minutes and the sleep efficiency was 70.4%%. The total sleep time was 319.5 minutes. The patient spent 7.2%% of the night in stage N1 sleep, 79.0%% in stage N2 sleep, 0.0%% in stage N3 and 13.8% in REM.Stage REM latency was 114.5 minutes Wake  after sleep onset was 38.7. Alpha intrusion was absent. Supine sleep was 64.49%. CARDIAC DATA  The 2 lead EKG demonstrated sinus rhythm. The mean heart rate was 85.5 beats per minute. Other EKG findings include: None.  LEG MOVEMENT DATA  The total Periodic Limb Movements of Sleep (PLMS) were 0. The PLMS index was 0.0. A PLMS index of <15 is considered normal in adults. IMPRESSIONS  - The optimal PAP pressure was 8 cm of water. - He did not require the use of supplemenal oxygen during this study. DIAGNOSIS  - Obstructive Sleep Apnea (327.23 [G47.33 ICD-10]) RECOMMENDATIONS  - Trial of CPAP therapy on 8 cm H2O with a Large size Resmed Nasal Pillow Mask AirFit P30 mask and heated humidification. [Electronically signed] 04/07/2018 11:48 AM [name] MD, ABSM  Diplomate, American Board of Sleep Medicine  NPI: [id number]
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
## 2018-04-07 NOTE — Telephone Encounter (Signed)
Pt aware that I will send message to VS and nurse to advise on results of sleep study.    VS Please advise thanks.

## 2018-04-07 NOTE — Telephone Encounter (Signed)
LMTCB x1 for pt.  

## 2018-04-07 NOTE — Telephone Encounter (Signed)
Pt is calling back [phone]

## 2018-04-08 ENCOUNTER — Encounter (HOSPITAL_BASED_OUTPATIENT_CLINIC_OR_DEPARTMENT_OTHER): Payer: Self-pay | Admitting: *Deleted

## 2018-04-08 ENCOUNTER — Other Ambulatory Visit: Payer: Self-pay

## 2018-04-08 ENCOUNTER — Emergency Department (HOSPITAL_BASED_OUTPATIENT_CLINIC_OR_DEPARTMENT_OTHER): Payer: Medicare Other

## 2018-04-08 ENCOUNTER — Emergency Department (HOSPITAL_BASED_OUTPATIENT_CLINIC_OR_DEPARTMENT_OTHER)
Admission: EM | Admit: 2018-04-08 | Discharge: 2018-04-08 | Disposition: A | Discharge status: Home / Self Care | Payer: Medicare Other | Attending: Emergency Medicine | Admitting: Emergency Medicine

## 2018-04-08 DIAGNOSIS — Z79899 Other long term (current) drug therapy: Secondary | ICD-10-CM | POA: Diagnosis not present

## 2018-04-08 DIAGNOSIS — J45909 Unspecified asthma, uncomplicated: Secondary | ICD-10-CM | POA: Insufficient documentation

## 2018-04-08 DIAGNOSIS — R1030 Lower abdominal pain, unspecified: Secondary | ICD-10-CM | POA: Diagnosis not present

## 2018-04-08 DIAGNOSIS — Y939 Activity, unspecified: Secondary | ICD-10-CM | POA: Insufficient documentation

## 2018-04-08 DIAGNOSIS — M25551 Pain in right hip: Secondary | ICD-10-CM | POA: Diagnosis not present

## 2018-04-08 DIAGNOSIS — Y92019 Unspecified place in single-family (private) house as the place of occurrence of the external cause: Secondary | ICD-10-CM | POA: Diagnosis not present

## 2018-04-08 DIAGNOSIS — S76219A Strain of adductor muscle, fascia and tendon of unspecified thigh, initial encounter: Secondary | ICD-10-CM

## 2018-04-08 DIAGNOSIS — M25552 Pain in left hip: Secondary | ICD-10-CM | POA: Insufficient documentation

## 2018-04-08 DIAGNOSIS — G8929 Other chronic pain: Secondary | ICD-10-CM | POA: Insufficient documentation

## 2018-04-08 DIAGNOSIS — W19XXXA Unspecified fall, initial encounter: Secondary | ICD-10-CM | POA: Insufficient documentation

## 2018-04-08 DIAGNOSIS — Y998 Other external cause status: Secondary | ICD-10-CM | POA: Diagnosis not present

## 2018-04-08 DIAGNOSIS — S79929A Unspecified injury of unspecified thigh, initial encounter: Secondary | ICD-10-CM | POA: Diagnosis present

## 2018-04-08 LAB — CBC
HCT: 44.3 % (ref 39.0–52.0)
Hemoglobin: 14.2 g/dL (ref 13.0–17.0)
MCH: 28 pg (ref 26.0–34.0)
MCHC: 32.1 g/dL (ref 30.0–36.0)
MCV: 87.4 fL (ref 80.0–100.0)
Platelets: 133 10*3/uL — ABNORMAL LOW (ref 150–400)
RBC: 5.07 MIL/uL (ref 4.22–5.81)
RDW: 14 % (ref 11.5–15.5)
WBC: 3.7 10*3/uL — ABNORMAL LOW (ref 4.0–10.5)
nRBC: 0 % (ref 0.0–0.2)

## 2018-04-08 LAB — COMPREHENSIVE METABOLIC PANEL
ALT: 19 U/L (ref 0–44)
AST: 18 U/L (ref 15–41)
Albumin: 4 g/dL (ref 3.5–5.0)
Alkaline Phosphatase: 62 U/L (ref 38–126)
Anion gap: 7 (ref 5–15)
BUN: 10 mg/dL (ref 6–20)
CO2: 24 mmol/L (ref 22–32)
Calcium: 8.7 mg/dL — ABNORMAL LOW (ref 8.9–10.3)
Chloride: 106 mmol/L (ref 98–111)
Creatinine, Ser: 1.07 mg/dL (ref 0.61–1.24)
GFR calc Af Amer: >60 mL/min (ref >60)
GFR calc non Af Amer: >60 mL/min (ref >60)
Glucose, Bld: 100 mg/dL — ABNORMAL HIGH (ref 70–99)
Potassium: 3.8 mmol/L (ref 3.5–5.1)
Sodium: 137 mmol/L (ref 135–145)
Total Bilirubin: 0.5 mg/dL (ref 0.3–1.2)
Total Protein: 7.3 g/dL (ref 6.5–8.1)

## 2018-04-08 LAB — URINALYSIS, MICROSCOPIC (REFLEX)

## 2018-04-08 LAB — URINALYSIS, ROUTINE W REFLEX MICROSCOPIC
Bilirubin Urine: NEGATIVE
Glucose, UA: NEGATIVE mg/dL
Ketones, ur: NEGATIVE mg/dL
Leukocytes, UA: NEGATIVE
Nitrite: NEGATIVE
Protein, ur: NEGATIVE mg/dL
Specific Gravity, Urine: 1.025 (ref 1.005–1.030)
pH: 6 (ref 5.0–8.0)

## 2018-04-08 LAB — LIPASE, BLOOD: Lipase: 28 U/L (ref 11–51)

## 2018-04-08 MED ORDER — HYDROCODONE-ACETAMINOPHEN 5-325 MG PO TABS: 1.0000 | ORAL_TABLET | ORAL | 0 refills | Status: DC | PRN

## 2018-04-08 MED ORDER — IBUPROFEN 400 MG PO TABS
ORAL_TABLET | ORAL | Status: AC
  Filled 2018-04-08: qty 1

## 2018-04-08 MED ORDER — OXYCODONE-ACETAMINOPHEN 5-325 MG PO TABS
1.0000 | ORAL_TABLET | Freq: Once | ORAL | Status: DC
  Filled 2018-04-08: qty 1

## 2018-04-08 MED ORDER — IBUPROFEN 400 MG PO TABS
400.0000 mg | ORAL_TABLET | Freq: Once | ORAL | Status: AC | PRN
  Administered 2018-04-08: 400 mg via ORAL

## 2018-04-08 NOTE — ED Notes (Signed)
Pt declined pain medication at this time- wanting to hear back from wife to make sure he has a ride.

## 2018-04-08 NOTE — ED Notes (Signed)
ED Provider at bedside. 
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
## 2018-04-08 NOTE — ED Notes (Signed)
Pt reports falling in December "doing a split" and pain to bilat hips. Pt reports he was not seen by doctor because with ibuprofen/tylenol he felt better. This weekend pt slipped on ramp and heard both hips popped. Pt reports increased pain with any movement. No meds taken today

## 2018-04-08 NOTE — Discharge Instructions (Addendum)
Please rest and apply heat to the area Take norco for pain as needed Please follow up with orthopedics

## 2018-04-08 NOTE — ED Triage Notes (Signed)
Abdominal pain in his left lower quadrant and pain in both hips x 3 weeks. He has been taking Mobic with temporary relief.

## 2018-04-08 NOTE — ED Notes (Signed)
Patient transported to X-ray 

## 2018-04-08 NOTE — ED Notes (Signed)
Pt provided snack per request. Reports pain still 8/10- declined medication at this time.

## 2018-04-19 IMAGING — CR DG CHEST 2V
2 series · 2 of 2 positions shown · non-contrast
Comparison: Chest radiographs 01/13/2013. Thoracic spine
radiographs 02/10/2014.

CLINICAL DATA: Cough, weakness.

EXAM:
CHEST  2 VIEW

[w chest pa]
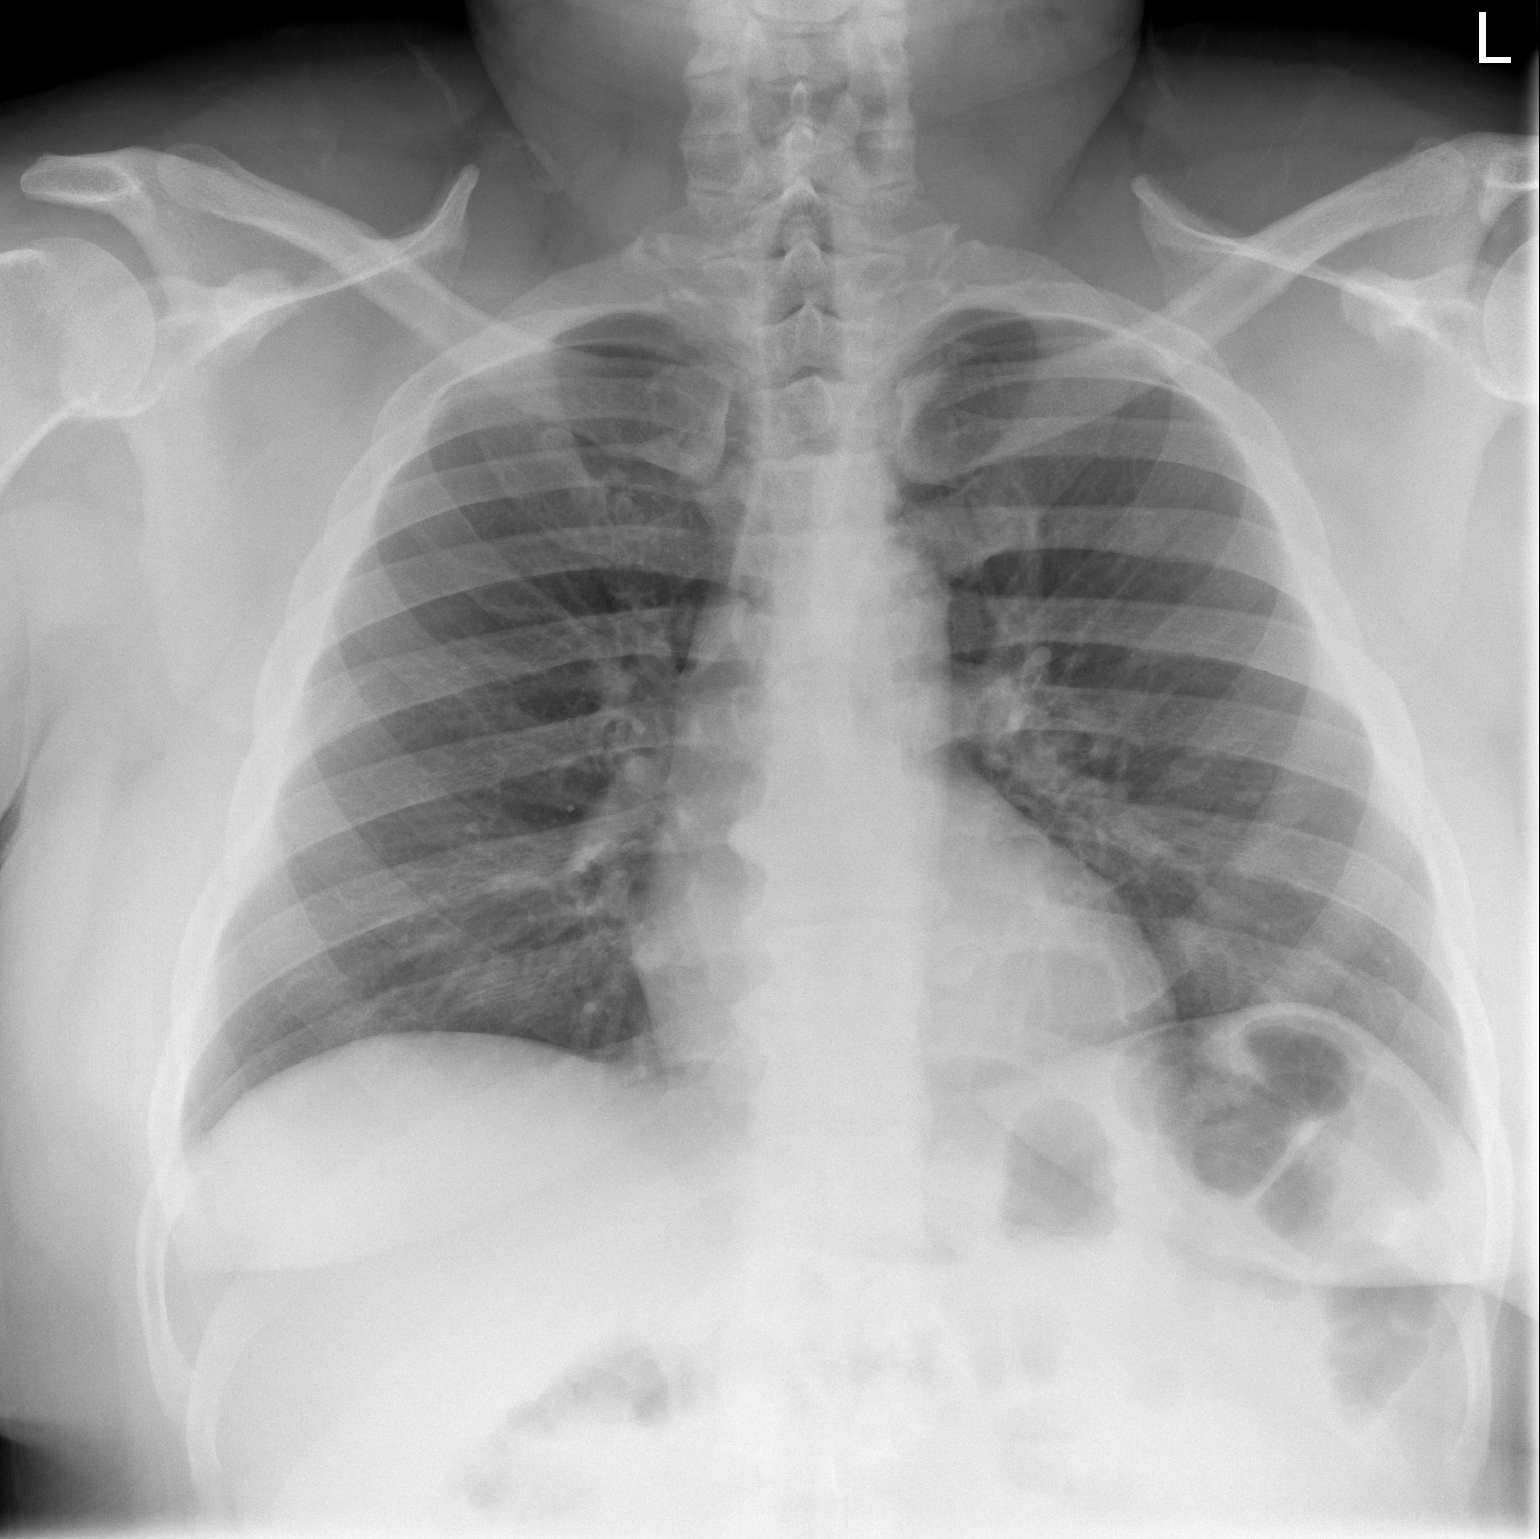

[w chest lat]
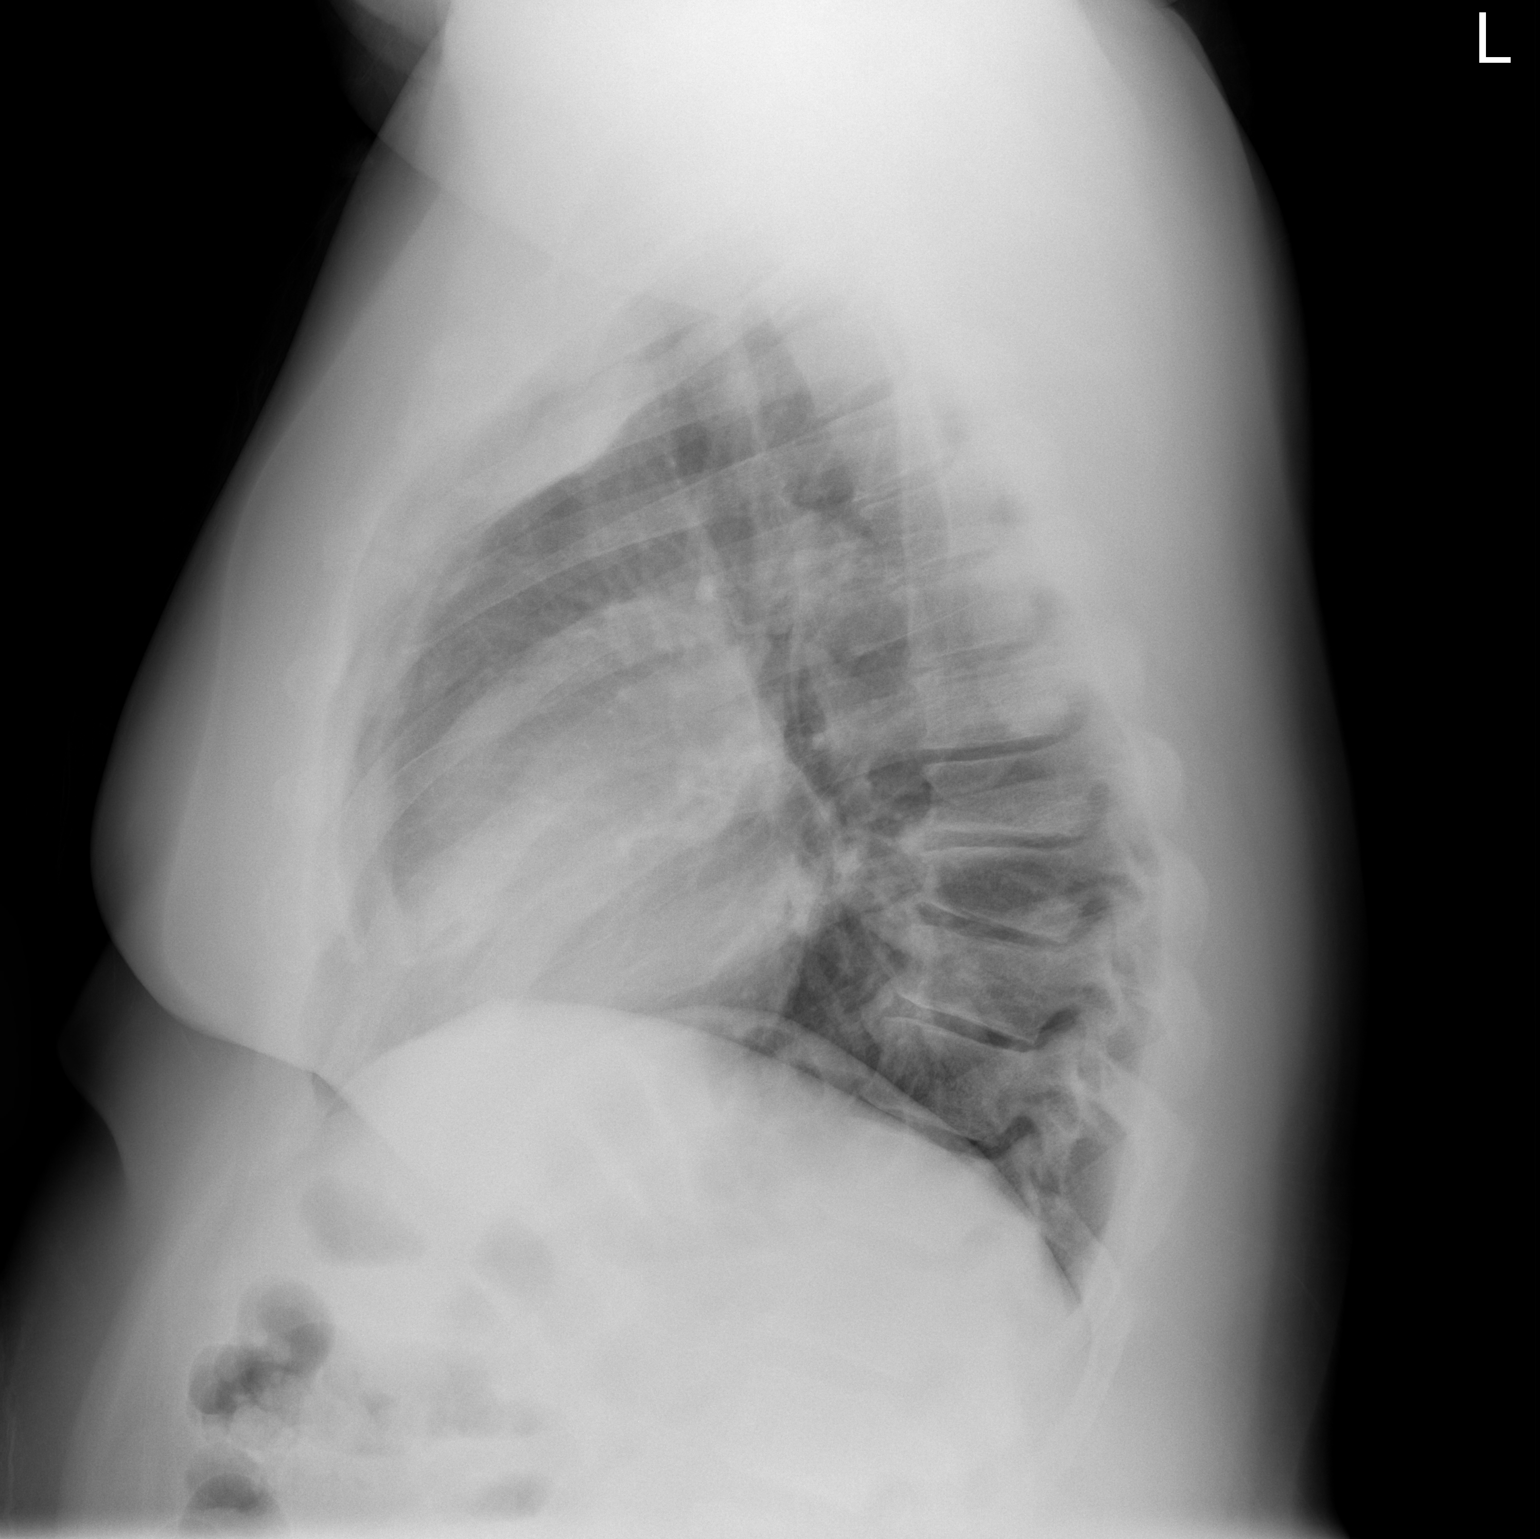

[2 of 2 positions shown; findings below may reference images not displayed]

FINDINGS: The heart size and mediastinal contours are normal. The lungs are
clear. There is no pleural effusion or pneumothorax. No acute
osseous findings are identified. There are stable paraspinal
osteophytes throughout the thoracic spine.
IMPRESSION: Stable chest.  No active cardiopulmonary process.

## 2018-05-14 ENCOUNTER — Other Ambulatory Visit: Payer: Self-pay | Admitting: Orthopedic Surgery

## 2018-05-14 DIAGNOSIS — G8929 Other chronic pain: Secondary | ICD-10-CM

## 2018-05-14 DIAGNOSIS — M545 Low back pain, unspecified: Secondary | ICD-10-CM

## 2018-05-23 ENCOUNTER — Ambulatory Visit
Admission: RE | Admit: 2018-05-23 | Discharge: 2018-05-23 | Disposition: A | Discharge status: Home / Self Care | Payer: 59 | Source: Ambulatory Visit | Attending: Orthopedic Surgery | Admitting: Orthopedic Surgery

## 2018-05-23 ENCOUNTER — Other Ambulatory Visit: Payer: Self-pay

## 2018-05-23 DIAGNOSIS — M545 Low back pain: Principal | ICD-10-CM

## 2018-05-23 DIAGNOSIS — G8929 Other chronic pain: Secondary | ICD-10-CM

## 2018-06-03 ENCOUNTER — Encounter (INDEPENDENT_AMBULATORY_CARE_PROVIDER_SITE_OTHER): Payer: Self-pay

## 2018-06-18 ENCOUNTER — Ambulatory Visit (INDEPENDENT_AMBULATORY_CARE_PROVIDER_SITE_OTHER): Payer: Self-pay | Admitting: Bariatrics

## 2018-07-02 ENCOUNTER — Ambulatory Visit (INDEPENDENT_AMBULATORY_CARE_PROVIDER_SITE_OTHER): Payer: Self-pay | Admitting: Bariatrics

## 2018-07-18 ENCOUNTER — Encounter: Payer: Self-pay | Admitting: Pulmonary Disease

## 2018-07-18 ENCOUNTER — Ambulatory Visit: Payer: Self-pay | Admitting: Pulmonary Disease

## 2018-07-18 ENCOUNTER — Ambulatory Visit (INDEPENDENT_AMBULATORY_CARE_PROVIDER_SITE_OTHER): Payer: Medicare Other | Admitting: Pulmonary Disease

## 2018-07-18 ENCOUNTER — Other Ambulatory Visit: Payer: Self-pay

## 2018-07-18 VITALS — BP 110/62 | HR 72 | Temp 98.1°F | Ht 69.0 in | Wt 306.2 lb

## 2018-07-18 DIAGNOSIS — J309 Allergic rhinitis, unspecified: Secondary | ICD-10-CM

## 2018-07-18 DIAGNOSIS — J45998 Other asthma: Secondary | ICD-10-CM

## 2018-07-18 DIAGNOSIS — Z9989 Dependence on other enabling machines and devices: Secondary | ICD-10-CM | POA: Diagnosis not present

## 2018-07-18 DIAGNOSIS — G4733 Obstructive sleep apnea (adult) (pediatric): Secondary | ICD-10-CM | POA: Diagnosis not present

## 2018-07-18 MED ORDER — LEVOCETIRIZINE DIHYDROCHLORIDE 5 MG PO TABS: 5.0000 mg | ORAL_TABLET | Freq: Every evening | ORAL | 6 refills | Status: DC

## 2018-07-18 NOTE — Assessment & Plan Note (Addendum)
Assessment: Moderate obstructive sleep apnea on home sleep study CPAP compliance report shows excellent compliance  Plan: Continue CPAP therapy Follow-up in 6 months

## 2018-07-18 NOTE — Patient Instructions (Addendum)
Continue Symbicort 160 >>> 2 puffs in the morning right when you wake up, rinse out your mouth after use, 12 hours later 2 puffs, rinse after use >>> Take this daily, no matter what >>> This is not a rescue inhaler   Start Xyzal daily Stop Zyrtec daily   Continue Flonase 1 spray each nostril daily   Start nasal saline rinses 1-2 times daily    We recommend that you continue using your CPAP daily >>>Keep up the hard work using your device >>> Goal should be wearing this for the entire night that you are sleeping, at least 4 to 6 hours  Remember:  . Do not drive or operate heavy machinery if tired or drowsy.  . Please notify the supply company and office if you are unable to use your device regularly due to missing supplies or machine being broken.  . Work on maintaining a healthy weight and following your recommended nutrition plan  . Maintain proper daily exercise and movement  . Maintaining proper use of your device can also help improve management of other chronic illnesses such as: Blood pressure, blood sugars, and weight management.   BiPAP/ CPAP Cleaning:  >>>Clean weekly, with Dawn soap, and bottle brush.  Set up to air dry.    No follow-ups on file.   Coronavirus (COVID-19) Are you at risk?  Are you at risk for the Coronavirus (COVID-19)?  To be considered HIGH RISK for Coronavirus (COVID-19), you have to meet the following criteria:  . Traveled to [place], [place], [place], [place] or [place]; or in the [place] to [place], [place], [place], or [place]; and have fever, cough, and shortness of breath within the last 2 weeks of travel OR . Been in close contact with a person diagnosed with COVID-19 within the last 2 weeks and have fever, cough, and shortness of breath . IF YOU DO NOT MEET THESE CRITERIA, YOU ARE CONSIDERED LOW RISK FOR COVID-19.  What to do if you are HIGH RISK for COVID-19?  Marland Kitchen If you are having a medical emergency, call 911. . Seek  medical care right away. Before you go to a doctor's office, urgent care or emergency department, call ahead and tell them about your recent travel, contact with someone diagnosed with COVID-19, and your symptoms. You should receive instructions from your physician's office regarding next steps of care.  . When you arrive at healthcare provider, tell the healthcare staff immediately you have returned from visiting [place], [place], [place], [place] or [place]; or traveled in the [place] to [place], [place], [place], or [place]; in the last two weeks or you have been in close contact with a person diagnosed with COVID-19 in the last 2 weeks.   . Tell the health care staff about your symptoms: fever, cough and shortness of breath. . After you have been seen by a medical provider, you will be either: o Tested for (COVID-19) and discharged home on quarantine except to seek medical care if symptoms worsen, and asked to  - Stay home and avoid contact with others until you get your results (4-5 days)  - Avoid travel on public transportation if possible (such as bus, train, or airplane) or o Sent to the Emergency Department by EMS for evaluation, COVID-19 testing, and possible admission depending on your condition and test results.  What to do if you are LOW RISK for COVID-19?  Reduce your risk of any infection by using the same precautions  used for avoiding the common cold or flu:  Marland Kitchen Wash your hands often with soap and warm water for at least 20 seconds.  If soap and water are not readily available, use an alcohol-based hand sanitizer with at least 60% alcohol.  . If coughing or sneezing, cover your mouth and nose by coughing or sneezing into the elbow areas of your shirt or coat, into a tissue or into your sleeve (not your hands). . Avoid shaking hands with others and consider head nods or verbal greetings only. . Avoid touching your eyes, nose, or mouth with unwashed hands.  . Avoid close  contact with people who are sick. . Avoid places or events with large numbers of people in one location, like concerts or sporting events. . Carefully consider travel plans you have or are making. . If you are planning any travel outside or inside the [place], visit the CDC's Travelers' Health webpage for the latest health notices. . If you have some symptoms but not all symptoms, continue to monitor at home and seek medical attention if your symptoms worsen. . If you are having a medical emergency, call 911.   [place] / e-Visit: [email or web address]         [facility]: [facility]: [phone]                   [facility]: [phone]           It is flu season:   >>> Best ways to protect herself from the flu: Receive the yearly flu vaccine, practice good hand hygiene washing with soap and also using hand sanitizer when available, eat a nutritious meals, get adequate rest, hydrate appropriately   Please contact the office if your symptoms worsen or you have concerns that you are not improving.   Thank you for choosing [facility] for your healthcare, and for allowing [place] to partner with you on your healthcare journey. I am thankful to be able to provide care to you today.   Wyn Quaker FNP-C

## 2018-07-18 NOTE — Assessment & Plan Note (Signed)
Assessment: Rhinorrhea Postnasal drip  Plan: Stop Zyrtec Start Xyzal Continue Flonase 1 spray each nostril as needed for nasal congestion Start nasal saline rinses 1-2 times daily

## 2018-07-18 NOTE — Assessment & Plan Note (Signed)
Plan: Continue Symbicort 160 Continue rescue inhaler as needed Start Xyzal, stop Zyrtec Flonase as needed Start nasal saline rinses Follow-up with our office in 6 months
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
## 2018-10-21 NOTE — Progress Notes (Signed)
Reviewed and agree with assessment/plan.   Mekel Haverstock, MD [facility] 03/07/2016, 12:24 PM Pager:  [phone]  

## 2018-10-23 ENCOUNTER — Other Ambulatory Visit: Payer: Self-pay | Admitting: Pulmonary Disease

## 2018-10-23 ENCOUNTER — Telehealth: Payer: Self-pay | Admitting: Pulmonary Disease

## 2018-10-23 MED ORDER — BUDESONIDE-FORMOTEROL FUMARATE 160-4.5 MCG/ACT IN AERO: 2.0000 | INHALATION_SPRAY | Freq: Two times a day (BID) | RESPIRATORY_TRACT | 11 refills | Status: DC

## 2018-10-23 NOTE — Telephone Encounter (Signed)
Refill for symbicort has been sent to preferred pharmacy for pt. Called and spoke with pt letting him know this had been done and pt verbalized understanding. Nothing further needed.

## 2018-11-03 ENCOUNTER — Other Ambulatory Visit: Payer: Self-pay

## 2018-11-03 DIAGNOSIS — Z20822 Contact with and (suspected) exposure to covid-19: Secondary | ICD-10-CM

## 2018-11-04 LAB — NOVEL CORONAVIRUS, NAA: SARS-CoV-2, NAA: NOT DETECTED

## 2018-11-18 ENCOUNTER — Ambulatory Visit: Payer: Medicare Other | Admitting: Gastroenterology

## 2018-11-19 ENCOUNTER — Encounter: Payer: Self-pay | Admitting: Gastroenterology

## 2018-11-19 ENCOUNTER — Encounter

## 2018-11-19 ENCOUNTER — Other Ambulatory Visit: Payer: Self-pay

## 2018-11-19 ENCOUNTER — Ambulatory Visit (INDEPENDENT_AMBULATORY_CARE_PROVIDER_SITE_OTHER): Payer: Medicare Other | Admitting: Gastroenterology

## 2018-11-19 VITALS — BP 112/70 | HR 80 | Temp 97.0°F | Ht 69.0 in | Wt 301.4 lb

## 2018-11-19 DIAGNOSIS — R110 Nausea: Secondary | ICD-10-CM | POA: Diagnosis not present
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
## 2018-11-19 NOTE — Patient Instructions (Signed)
You have been scheduled for an endoscopy. Please follow written instructions given to you at your visit today. If you use inhalers (even only as needed), please bring them with you on the day of your procedure. Your physician has requested that you go to www.[email or web address].com and enter the access code given to you at your visit today. This web site gives a general overview about your procedure. However, you should still follow specific instructions given to you by our office regarding your preparation for the procedure.  Thank you for entrusting me with your care and choosing [facility].  Dr Ardis Hughs

## 2018-11-22 ENCOUNTER — Ambulatory Visit (AMBULATORY_SURGERY_CENTER): Payer: Medicare Other | Admitting: Gastroenterology

## 2018-11-22 ENCOUNTER — Other Ambulatory Visit: Payer: Self-pay

## 2018-11-22 ENCOUNTER — Encounter: Payer: Self-pay | Admitting: Gastroenterology

## 2018-11-22 VITALS — BP 95/54 | HR 69 | Temp 98.3°F | Resp 18 | Ht 69.0 in | Wt 301.0 lb

## 2018-11-22 DIAGNOSIS — K2970 Gastritis, unspecified, without bleeding: Secondary | ICD-10-CM

## 2018-11-22 DIAGNOSIS — R110 Nausea: Secondary | ICD-10-CM

## 2018-11-22 MED ORDER — SODIUM CHLORIDE 0.9 % IV SOLN: 500.0000 mL | INTRAVENOUS | Status: DC

## 2018-11-22 NOTE — Patient Instructions (Signed)
YOU HAD AN ENDOSCOPIC PROCEDURE TODAY AT THE [facility]:   Refer to the procedure report that was given to you for any specific questions about what was found during the examination.  If the procedure report does not answer your questions, please call your gastroenterologist to clarify.  If you requested that your care partner not be given the details of your procedure findings, then the procedure report has been included in a sealed envelope for you to review at your convenience later.  YOU SHOULD EXPECT: Some feelings of bloating in the abdomen. Passage of more gas than usual.  Walking can help get rid of the air that was put into your GI tract during the procedure and reduce the bloating. If you had a lower endoscopy (such as a colonoscopy or flexible sigmoidoscopy) you may notice spotting of blood in your stool or on the toilet paper. If you underwent a bowel prep for your procedure, you may not have a normal bowel movement for a few days.  Please Note:  You might notice some irritation and congestion in your nose or some drainage.  This is from the oxygen used during your procedure.  There is no need for concern and it should clear up in a day or so.  SYMPTOMS TO REPORT IMMEDIATELY:   Following upper endoscopy (EGD)  Vomiting of blood or coffee ground material  New chest pain or pain under the shoulder blades  Painful or persistently difficult swallowing  New shortness of breath  Fever of 100F or higher  Black, tarry-looking stools  For urgent or emergent issues, a gastroenterologist can be reached at any hour by calling [phone].   DIET:  We do recommend a small meal at first, but then you may proceed to your regular diet.  Drink plenty of fluids but you should avoid alcoholic beverages for 24 hours.  ACTIVITY:  You should plan to take it easy for the rest of today and you should NOT DRIVE or use heavy machinery until tomorrow (because of the sedation medicines used  during the test).    FOLLOW UP: Our staff will call the number listed on your records 48-72 hours following your procedure to check on you and address any questions or concerns that you may have regarding the information given to you following your procedure. If we do not reach you, we will leave a message.  We will attempt to reach you two times.  During this call, we will ask if you have developed any symptoms of COVID 19. If you develop any symptoms (ie: fever, flu-like symptoms, shortness of breath, cough etc.) before then, please call [phone].  If you test positive for Covid 19 in the 2 weeks post procedure, please call and report this information to us.    If any biopsies were taken you will be contacted by phone or by letter within the next 1-3 weeks.  Please call us at [phone] if you have not heard about the biopsies in 3 weeks.    SIGNATURES/CONFIDENTIALITY: You and/or your care partner have signed paperwork which will be entered into your electronic medical record.  These signatures attest to the fact that that the information above on your After Visit Summary has been reviewed and is understood.  Full responsibility of the confidentiality of this discharge information lies with you and/or your care-partner. 

## 2018-11-22 NOTE — Op Note (Signed)
[facility] Patient Name: [name] Procedure Date: 11/22/2018 10:11 AM MRN: [id number] Endoscopist: Milus Banister , MD Age: 53 Referring MD:  Date of Birth: Aug 20, 1965 Gender: Male Account #: [id number] Procedure:                Upper GI endoscopy Indications:              Abdominal pain, Nausea, dark stools one month ago;                            GM had gastric cancer Medicines:                Monitored Anesthesia Care Procedure:                Pre-Anesthesia Assessment:                           - Prior to the procedure, a History and Physical                            was performed, and patient medications and                            allergies were reviewed. The patient's tolerance of                            previous anesthesia was also reviewed. The risks                            and benefits of the procedure and the sedation                            options and risks were discussed with the patient.                            All questions were answered, and informed consent                            was obtained. Prior Anticoagulants: The patient has                            taken no previous anticoagulant or antiplatelet                            agents. ASA Grade Assessment: III - A patient with                            severe systemic disease. After reviewing the risks                            and benefits, the patient was deemed in                            satisfactory condition to undergo the procedure.  After obtaining informed consent, the endoscope was                            passed under direct vision. Throughout the                            procedure, the patient's blood pressure, pulse, and                            oxygen saturations were monitored continuously. The                            Endoscope was introduced through the mouth, and                            advanced to the second part of  duodenum. The upper                            GI endoscopy was accomplished without difficulty.                            The patient tolerated the procedure well. Scope In: Scope Out: Findings:                 Mild inflammation characterized by erythema,                            friability and granularity was found in the gastric                            antrum. Biopsies were taken with a cold forceps for                            histology.                           The exam was otherwise without abnormality. Complications:            No immediate complications. Estimated blood loss:                            None. Estimated Blood Loss:     Estimated blood loss: none. Impression:               - Mild gastritis, biopsied to check for H. pylori.                           - The examination was otherwise normal. Recommendation:           - Patient has a contact number available for                            emergencies. The signs and symptoms of potential                            delayed complications were discussed with the  patient. Return to normal activities tomorrow.                            Written discharge instructions were provided to the                            patient.                           - Resume previous diet.                           - Continue present medications. Nausea and stomach                            irritation are common side effects of Mobic.                            Obesity (BMI 44) may contribute to nausea as well.                           - Await pathology results. If biopsies are + for H.                            pylori you will be started on appropriate                            antibiotics. Milus Banister, MD 11/22/2018 10:30:10 AM This report has been signed electronically.

## 2018-11-22 NOTE — Progress Notes (Signed)
Called to room to assist during endoscopic procedure.  Patient ID and intended procedure confirmed with present staff. Received instructions for my participation in the procedure from the performing physician.  

## 2018-11-22 NOTE — Progress Notes (Signed)
Report given to PACU, vss 

## 2018-11-25 ENCOUNTER — Telehealth: Payer: Self-pay | Admitting: *Deleted

## 2018-11-25 ENCOUNTER — Telehealth: Payer: Self-pay

## 2018-11-25 NOTE — Telephone Encounter (Signed)
LM on VM for pt second call back attempt.

## 2018-11-25 NOTE — Telephone Encounter (Signed)
First attempt follow up call to pt, LM on VM
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
## 2018-11-25 NOTE — Telephone Encounter (Signed)
     Spoke with patient and consented to do telephone visit along with patient mentioned he does not have portable BP machine and can visit local drug store and get most recent there.During verifying medications patient call dropped not for sure if lost signal. Patient was ordering or collecting a order for hotdogs during the call .Attempted to call patient back no answer.    TELEPHONE CALL NOTE  [name] has been deemed a candidate for a follow-up tele-health visit to limit community exposure during the Covid-19 pandemic. I spoke with the patient via phone to ensure availability of phone/video source, confirm preferred email & phone number, and discuss instructions and expectations.  I reminded [name] to be prepared with any vital sign and/or heart rhythm information that could potentially be obtained via home monitoring, at the time of his visit. I reminded [name] to expect a phone call prior to his visit.  [name], [place] 11/25/2018 2:24 PM   Confirm consent - "In the setting of the current Covid19 crisis, you are scheduled for a (phone or video) visit with your provider on (date) at (time).  Just as we do with many in-office visits, in order for you to participate in this visit, we must obtain consent.  If you'd like, I can send this to your mychart (if signed up) or email for you to review.  Otherwise, I can obtain your verbal consent now.  All virtual visits are billed to your insurance company just like a normal visit would be.  By agreeing to a virtual visit, we'd like you to understand that the technology does not allow for your provider to perform an examination, and thus may limit your provider's ability to fully assess your condition. If your provider identifies any concerns that need to be evaluated in person, we will make arrangements to do so.  Finally, though the technology is pretty good, we cannot assure that it will always work on either your or our end, and  in the setting of a video visit, we may have to convert it to a phone-only visit.  In either situation, we cannot ensure that we have a secure connection.  Are you willing to proceed?" STAFF: Did the patient verbally acknowledge consent to telehealth visit? Document YES/NO here: YES

## 2018-11-26 ENCOUNTER — Encounter: Payer: Self-pay | Admitting: Cardiovascular Disease

## 2018-11-26 ENCOUNTER — Telehealth (INDEPENDENT_AMBULATORY_CARE_PROVIDER_SITE_OTHER): Payer: Medicare Other | Admitting: Cardiovascular Disease

## 2018-11-26 ENCOUNTER — Other Ambulatory Visit: Payer: Self-pay

## 2018-11-26 ENCOUNTER — Encounter: Payer: Self-pay | Admitting: *Deleted

## 2018-11-26 VITALS — Ht 69.0 in | Wt 289.0 lb

## 2018-11-26 DIAGNOSIS — R0600 Dyspnea, unspecified: Secondary | ICD-10-CM | POA: Diagnosis not present

## 2018-11-26 DIAGNOSIS — R079 Chest pain, unspecified: Secondary | ICD-10-CM | POA: Diagnosis not present

## 2018-11-26 NOTE — Patient Instructions (Addendum)
Your physician recommends that you continue on your current medications as directed. Please refer to the Current Medication list given to you today.   Your physician has requested that you have an echocardiogram. Echocardiography is a painless test that uses sound waves to create images of your heart. It provides your doctor with information about the size and shape of your heart and how well your heart's chambers and valves are working. This procedure takes approximately one hour. There are no restrictions for this procedure.   Your physician has requested that you have a lexiscan myoview. For further information please visit [email or web address]. Please follow instruction sheet, as given.  Your physician wants you to follow-up in: [place] will receive a reminder letter in the mail two months in advance. If you don't receive a letter, please call our office to schedule the follow-up appointment.

## 2018-11-26 NOTE — Addendum Note (Signed)
Addended by: Devra Dopp E on: 11/26/2018 09:10 AM   Modules accepted: Orders

## 2018-11-27 ENCOUNTER — Telehealth: Payer: Self-pay | Admitting: Gastroenterology

## 2018-11-27 NOTE — Telephone Encounter (Signed)
The pt has been advised that the path has not been reviewed as of today and that we will contact him by phone or letter with results

## 2018-11-28 ENCOUNTER — Telehealth: Payer: Self-pay

## 2018-11-28 ENCOUNTER — Encounter: Payer: Self-pay | Admitting: Gastroenterology

## 2018-11-28 ENCOUNTER — Telehealth (HOSPITAL_COMMUNITY): Payer: Self-pay | Admitting: *Deleted

## 2018-11-28 MED ORDER — DIAZEPAM 5 MG PO TABS: ORAL_TABLET | ORAL | 0 refills | Status: DC

## 2018-11-28 NOTE — Telephone Encounter (Signed)
Patient has issues with claustrophobia and he is scheduled for myoview on Monday. Patient will need medication to complete test. Will forward to Dr. Johnsie Cancel for request for anxiety medication and dose.

## 2018-11-28 NOTE — Telephone Encounter (Signed)
Shouldn't need valium for myovue I discussed this with him If he has to have can give him 5 mg valium morning of

## 2018-11-28 NOTE — Telephone Encounter (Signed)
Patient given detailed instructions per Myocardial Perfusion Study Information Sheet for the test on 12/01/18 at 1:15. Patient notified to arrive 15 minutes early and that it is imperative to arrive on time for appointment to keep from having the test rescheduled.  If you need to cancel or reschedule your appointment, please call the office within 24 hours of your appointment. . Patient verbalized understanding.[name]

## 2018-11-28 NOTE — Telephone Encounter (Signed)
Patient stated he would not be able to do test without premedications. Called into [facility] valium for patient to take for 2 day myoview test next week.

## 2018-12-01 ENCOUNTER — Other Ambulatory Visit (HOSPITAL_COMMUNITY): Payer: Medicare Other

## 2018-12-01 ENCOUNTER — Ambulatory Visit (HOSPITAL_COMMUNITY): Payer: Medicare Other

## 2018-12-02 ENCOUNTER — Ambulatory Visit (HOSPITAL_COMMUNITY): Payer: Medicare Other

## 2018-12-02 ENCOUNTER — Telehealth: Payer: Self-pay | Admitting: Gastroenterology

## 2018-12-02 NOTE — Telephone Encounter (Signed)
Left message on machine to call back     November 28, 2018   OSBY MICHAELSON [address] [place] [address]   Dear Mr. Lipsett,  The biopsies taken during your recent upper endoscopy showed no sign of infection or cancer.   You should continue to follow the recommendations that we discussed at the time of your procedure.   If you have any questions or concerns, please don't hesitate to call.  Sincerely,    Milus Banister, MD  [place], [id number]                         1

## 2018-12-03 ENCOUNTER — Telehealth: Payer: Self-pay | Admitting: Gastroenterology

## 2018-12-03 DIAGNOSIS — R197 Diarrhea, unspecified: Secondary | ICD-10-CM

## 2018-12-03 DIAGNOSIS — R110 Nausea: Secondary | ICD-10-CM

## 2018-12-03 NOTE — Telephone Encounter (Signed)
He's had diarrhea, bloody at times for the past several days.  Associated with abd cramping.   No fevers.  No orthostatic symptoms, no vomiting.  I recommended he come in for labs in the morning, push fluids for now.    Can you please order cbc, cmet, sed rate, gi pathogen panel.  He's probably coming in early Thursday AM (around 8;30).

## 2018-12-03 NOTE — Telephone Encounter (Signed)
Left message on machine to call back unable to reach pt by phone.  Sent a message to the pt via My Chart to call or message if he has continued problems

## 2018-12-04 ENCOUNTER — Other Ambulatory Visit (INDEPENDENT_AMBULATORY_CARE_PROVIDER_SITE_OTHER): Payer: Medicare Other

## 2018-12-04 DIAGNOSIS — R197 Diarrhea, unspecified: Secondary | ICD-10-CM | POA: Diagnosis not present

## 2018-12-04 DIAGNOSIS — R110 Nausea: Secondary | ICD-10-CM | POA: Diagnosis not present

## 2018-12-04 LAB — CBC WITH DIFFERENTIAL/PLATELET
Basophils Absolute: 0.1 10*3/uL (ref 0.0–0.1)
Basophils Relative: 1.4 % (ref 0.0–3.0)
Eosinophils Absolute: 0.2 10*3/uL (ref 0.0–0.7)
Eosinophils Relative: 3.6 % (ref 0.0–5.0)
HCT: 43.2 % (ref 39.0–52.0)
Hemoglobin: 14.2 g/dL (ref 13.0–17.0)
Lymphocytes Relative: 30.3 % (ref 12.0–46.0)
Lymphs Abs: 1.4 10*3/uL (ref 0.7–4.0)
MCHC: 32.9 g/dL (ref 30.0–36.0)
MCV: 85.6 fl (ref 78.0–100.0)
Monocytes Absolute: 0.6 10*3/uL (ref 0.1–1.0)
Monocytes Relative: 14 % — ABNORMAL HIGH (ref 3.0–12.0)
Neutro Abs: 2.3 10*3/uL (ref 1.4–7.7)
Neutrophils Relative %: 50.7 % (ref 43.0–77.0)
Platelets: 119 10*3/uL — ABNORMAL LOW (ref 150.0–400.0)
RBC: 5.04 Mil/uL (ref 4.22–5.81)
RDW: 15.2 % (ref 11.5–15.5)
WBC: 4.6 10*3/uL (ref 4.0–10.5)

## 2018-12-04 LAB — COMPREHENSIVE METABOLIC PANEL
ALT: 23 U/L (ref 0–53)
AST: 22 U/L (ref 0–37)
Albumin: 4.3 g/dL (ref 3.5–5.2)
Alkaline Phosphatase: 54 U/L (ref 39–117)
BUN: 10 mg/dL (ref 6–23)
CO2: 25 mEq/L (ref 19–32)
Calcium: 8.8 mg/dL (ref 8.4–10.5)
Chloride: 106 mEq/L (ref 96–112)
Creatinine, Ser: 1.08 mg/dL (ref 0.40–1.50)
GFR: 87.28 mL/min (ref >60.00)
Glucose, Bld: 93 mg/dL (ref 70–99)
Potassium: 3.5 mEq/L (ref 3.5–5.1)
Sodium: 138 mEq/L (ref 135–145)
Total Bilirubin: 0.6 mg/dL (ref 0.2–1.2)
Total Protein: 7.2 g/dL (ref 6.0–8.3)

## 2018-12-04 LAB — SEDIMENTATION RATE: Sed Rate: 20 mm/hr (ref 0–20)

## 2018-12-04 NOTE — Telephone Encounter (Signed)
Lab order in Epic and pt is here for labs now.

## 2018-12-05 ENCOUNTER — Telehealth: Payer: Self-pay | Admitting: Gastroenterology

## 2018-12-05 ENCOUNTER — Encounter (HOSPITAL_COMMUNITY): Payer: Self-pay | Admitting: Cardiovascular Disease

## 2018-12-05 NOTE — Telephone Encounter (Signed)
See results note. 

## 2018-12-08 ENCOUNTER — Other Ambulatory Visit: Payer: 59

## 2018-12-08 DIAGNOSIS — R197 Diarrhea, unspecified: Secondary | ICD-10-CM

## 2018-12-08 DIAGNOSIS — R110 Nausea: Secondary | ICD-10-CM

## 2018-12-10 LAB — GASTROINTESTINAL PATHOGEN PANEL PCR
C. difficile Tox A/B, PCR: NOT DETECTED
Campylobacter, PCR: NOT DETECTED
Cryptosporidium, PCR: NOT DETECTED
E coli (ETEC) LT/ST PCR: NOT DETECTED
E coli (STEC) stx1/stx2, PCR: NOT DETECTED
E coli 0157, PCR: NOT DETECTED
Giardia lamblia, PCR: NOT DETECTED
Norovirus, PCR: NOT DETECTED
Rotavirus A, PCR: NOT DETECTED
Salmonella, PCR: NOT DETECTED
Shigella, PCR: NOT DETECTED

## 2019-01-06 ENCOUNTER — Encounter: Payer: Self-pay | Admitting: Gastroenterology

## 2019-01-06 ENCOUNTER — Ambulatory Visit (INDEPENDENT_AMBULATORY_CARE_PROVIDER_SITE_OTHER): Payer: Medicare Other | Admitting: Gastroenterology

## 2019-01-06 VITALS — BP 104/70 | HR 84 | Temp 98.1°F | Ht 68.0 in | Wt 292.0 lb

## 2019-01-06 DIAGNOSIS — R110 Nausea: Secondary | ICD-10-CM | POA: Diagnosis not present

## 2019-01-06 NOTE — Patient Instructions (Signed)
If you are age 51 or older, your body mass index should be between 23-30. Your Body mass index is 44.4 kg/m. If this is out of the aforementioned range listed, please consider follow up with your Primary Care Provider.  If you are age 41 or younger, your body mass index should be between 19-25. Your Body mass index is 44.4 kg/m. If this is out of the aformentioned range listed, please consider follow up with your Primary Care Provider.   Stop taking Ibuprofen and take Tylenol for any aches and pains that you may have   call us in 1 month to report how you are doing  Thank you for entrusting me with your care and choosing [facility].  Dr Ardis Hughs
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
## 2019-03-10 ENCOUNTER — Ambulatory Visit (INDEPENDENT_AMBULATORY_CARE_PROVIDER_SITE_OTHER): Payer: 59 | Admitting: Podiatry

## 2019-03-10 DIAGNOSIS — M722 Plantar fascial fibromatosis: Secondary | ICD-10-CM

## 2019-03-11 ENCOUNTER — Other Ambulatory Visit: Payer: Self-pay

## 2019-03-11 ENCOUNTER — Encounter: Payer: Self-pay | Admitting: Primary Care

## 2019-03-11 ENCOUNTER — Ambulatory Visit (INDEPENDENT_AMBULATORY_CARE_PROVIDER_SITE_OTHER): Payer: Medicare Other | Admitting: Primary Care

## 2019-03-11 DIAGNOSIS — J4531 Mild persistent asthma with (acute) exacerbation: Secondary | ICD-10-CM | POA: Diagnosis not present

## 2019-03-11 DIAGNOSIS — G4733 Obstructive sleep apnea (adult) (pediatric): Secondary | ICD-10-CM | POA: Diagnosis not present

## 2019-03-11 DIAGNOSIS — Z9989 Dependence on other enabling machines and devices: Secondary | ICD-10-CM | POA: Diagnosis not present

## 2019-03-11 MED ORDER — PREDNISONE 10 MG PO TABS: ORAL_TABLET | ORAL | 0 refills | Status: DC

## 2019-03-11 MED ORDER — ALBUTEROL SULFATE HFA 108 (90 BASE) MCG/ACT IN AERS: 2.0000 | INHALATION_SPRAY | RESPIRATORY_TRACT | 5 refills | Status: DC | PRN

## 2019-03-11 MED ORDER — BUDESONIDE-FORMOTEROL FUMARATE 160-4.5 MCG/ACT IN AERO: 2.0000 | INHALATION_SPRAY | Freq: Two times a day (BID) | RESPIRATORY_TRACT | 11 refills | Status: DC

## 2019-03-11 NOTE — Progress Notes (Signed)
Virtual Visit via Telephone Note  I connected with [name] on 03/11/19 at  4:00 PM EST by telephone and verified that I am speaking with the correct person using two identifiers.  Location: Patient: Home Provider: Office   I discussed the limitations, risks, security and privacy concerns of performing an evaluation and management service by telephone and the availability of in person appointments. I also discussed with the patient that there may be a patient responsible charge related to this service. The patient expressed understanding and agreed to proceed.   History of Present Illness: 53 year old male, never smoked. PMH significant for OSA on CPAP, persistent asthma, allergic rhinitis, cough, obesity. Patient of Dr. Halford Chessman, last seen on 07/18/18. Maintained on CPAP, pressure changed to 7-15cm h20 at last visit.  03/11/2019 Patient contacted today for acute televisit. Reports increased shortness of breath x 1 week. He gets winded with activity and when lying flat. Experiences occasional wheezing. He has been using Symbicort but ran out 1 week ago. He continues to wear CPAP at night, feels pressure may not be enough. Denies sick contact, fever, chills, chest tightness or cough.   [facility] 11/30-12/29/20: - Usage 30/30 days - Pressure 5-15cm h20 - AHI 0.8  Observations/Objective:  - No significant shortness of breath, wheezing or cough noted during phone conversation  Assessment and Plan:  Asthma with acute exacerbation: - Increased shortness of breath and wheezing x 1 week after running out of Symbicort  - Resume Symbicort 160 two puffs twice daily; albuterol 2 puffs every 4 hours for sob.wheezing (refills sent) - Rx Prednisone 20mg  x 5 days  Sleep apnea: - 100% compliant with use - Change pressure 7-15cm h2O (feels pressure support is not enough); AHI 0.8 - Continue to wear CPAP every night for 4-6 hours or more - Do not drive if experiencing excessive daytime fatigue or  somnolence   Follow Up Instructions:  - Return/call office if shortness of breath does not improve with above plan   I discussed the assessment and treatment plan with the patient. The patient was provided an opportunity to ask questions and all were answered. The patient agreed with the plan and demonstrated an understanding of the instructions.   The patient was advised to call back or seek an in-person evaluation if the symptoms worsen or if the condition fails to improve as anticipated.  I provided 18 minutes of non-face-to-face time during this encounter.   Martyn Ehrich, NP

## 2019-03-11 NOTE — Patient Instructions (Addendum)
Recommendations: Adjust CPAP pressure 7-15cm H20  Resume Symbicort 160 two puffs twice daily  RX prendisone 20mg  x 5 days   Follow-up: If shortness of breath does not improve in 5-7 days with above plan

## 2019-03-16 NOTE — Progress Notes (Signed)
Reviewed and agree with assessment/plan.   Salsabeel Gorelick, MD [facility] 03/07/2016, 12:24 PM Pager:  [phone]  

## 2019-03-25 ENCOUNTER — Ambulatory Visit (INDEPENDENT_AMBULATORY_CARE_PROVIDER_SITE_OTHER): Payer: Medicare Other | Admitting: Pulmonary Disease

## 2019-03-25 ENCOUNTER — Other Ambulatory Visit: Payer: Self-pay

## 2019-03-25 ENCOUNTER — Encounter: Payer: Self-pay | Admitting: Pulmonary Disease

## 2019-03-25 ENCOUNTER — Telehealth: Payer: Self-pay | Admitting: General Surgery

## 2019-03-25 VITALS — BP 128/74 | HR 80 | Temp 98.2°F | Ht 68.0 in | Wt 300.0 lb

## 2019-03-25 DIAGNOSIS — J45998 Other asthma: Secondary | ICD-10-CM

## 2019-03-25 MED ORDER — TRELEGY ELLIPTA 200-62.5-25 MCG/INH IN AEPB: 1.0000 | INHALATION_SPRAY | Freq: Every day | RESPIRATORY_TRACT | 0 refills | Status: DC
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
## 2019-03-25 NOTE — Patient Instructions (Signed)
Trelegy one puff daily and rinse mouth after each use  Stop using symbicort while using Trelegy; if trelegy is working better for you, then call to get a refill  Follow up in 2 months

## 2019-03-25 NOTE — Telephone Encounter (Signed)
I called the patient to make him aware per response received by text from Dr. Halford Chessman, that the letter he requested during his office visit would be provided by the end of the week. Patient voiced understanding. Advised he would be called when ready, since he wants to come to clinic to pick it up. Nothing further needed at this time.

## 2019-04-03 ENCOUNTER — Other Ambulatory Visit: Payer: Self-pay | Admitting: Orthopedic Surgery

## 2019-04-03 DIAGNOSIS — M1611 Unilateral primary osteoarthritis, right hip: Secondary | ICD-10-CM

## 2019-04-13 ENCOUNTER — Other Ambulatory Visit: Payer: Self-pay

## 2019-04-13 ENCOUNTER — Ambulatory Visit
Admission: RE | Admit: 2019-04-13 | Discharge: 2019-04-13 | Disposition: A | Discharge status: Home / Self Care | Payer: 59 | Source: Ambulatory Visit | Attending: Orthopedic Surgery | Admitting: Orthopedic Surgery

## 2019-04-13 ENCOUNTER — Telehealth: Payer: Self-pay | Admitting: Pulmonary Disease

## 2019-04-13 DIAGNOSIS — M1611 Unilateral primary osteoarthritis, right hip: Secondary | ICD-10-CM

## 2019-04-13 NOTE — Telephone Encounter (Signed)
Attempted to call pt but unable to reach. Left message for pt to return call. 

## 2019-04-14 NOTE — Telephone Encounter (Signed)
LMTCB x2 for pt 

## 2019-04-15 NOTE — Telephone Encounter (Signed)
LMTCB x3 for pt. We have attempted to contact pt several times with no success or call back from pt. Per triage protocol, message will be closed.   

## 2019-04-30 ENCOUNTER — Ambulatory Visit: Payer: Self-pay

## 2019-05-04 ENCOUNTER — Ambulatory Visit: Payer: 59 | Attending: Family

## 2019-05-04 DIAGNOSIS — Z23 Encounter for immunization: Secondary | ICD-10-CM | POA: Insufficient documentation

## 2019-05-04 NOTE — Progress Notes (Signed)
   Covid-19 Vaccination Clinic  Name:  [name]    MRN: [id number] DOB: 1966-01-07  05/04/2019  [name] was observed post Covid-19 immunization for 15 minutes without incidence. He was provided with Vaccine Information Sheet and instruction to access the V-Safe system.   [name] was instructed to call 911 with any severe reactions post vaccine: Marland Kitchen Difficulty breathing  . Swelling of your face and throat  . A fast heartbeat  . A bad rash all over your body  . Dizziness and weakness    Immunizations Administered    Name Date Dose VIS Date Route   Moderna COVID-19 Vaccine 05/04/2019  4:28 PM 0.5 mL 02/10/2019 Intramuscular   Manufacturer: Moderna   Lot: [id number]   [id number]

## 2019-05-08 ENCOUNTER — Ambulatory Visit (INDEPENDENT_AMBULATORY_CARE_PROVIDER_SITE_OTHER): Payer: Medicare Other | Admitting: Pulmonary Disease

## 2019-05-08 ENCOUNTER — Encounter: Payer: Self-pay | Admitting: Pulmonary Disease

## 2019-05-08 ENCOUNTER — Other Ambulatory Visit: Payer: Self-pay

## 2019-05-08 VITALS — HR 78

## 2019-05-08 DIAGNOSIS — G4733 Obstructive sleep apnea (adult) (pediatric): Secondary | ICD-10-CM

## 2019-05-08 DIAGNOSIS — J45998 Other asthma: Secondary | ICD-10-CM

## 2019-05-08 DIAGNOSIS — Z9989 Dependence on other enabling machines and devices: Secondary | ICD-10-CM | POA: Diagnosis not present

## 2019-05-08 DIAGNOSIS — R0609 Other forms of dyspnea: Secondary | ICD-10-CM

## 2019-05-08 DIAGNOSIS — R0600 Dyspnea, unspecified: Secondary | ICD-10-CM
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
## 2019-05-08 NOTE — Patient Instructions (Signed)
Follow up in 3 months

## 2019-05-12 ENCOUNTER — Telehealth: Payer: Self-pay | Admitting: Pulmonary Disease

## 2019-05-12 NOTE — Telephone Encounter (Signed)
I called and spoke with the patient and made him aware of recommendations. He verbalized understating.

## 2019-05-12 NOTE — Telephone Encounter (Signed)
I called and spoke with the patient and he states that he wants to know if he should go to an outdoor even, because he was told by Dr. Halford Chessman that he should not gather at church inside, but he wanted to know about outside. I advised him that it is ultimately his decision. I advised him that he has a lung condition and we are in a pandemic and to use his best judgement. Please advise if there is anything to add as Dr. Halford Chessman is out of the office.

## 2019-05-12 NOTE — Telephone Encounter (Signed)
Outdoor activities are generally safer than indoor gatherings. Keep mask on at all times and stay 6 feet away from others. Do not be near others that are not wearing mask. Truck or car fumes would trigger an asthma exacerbation so be careful and take any of his inhalers with him.

## 2019-05-26 DIAGNOSIS — H9113 Presbycusis, bilateral: Secondary | ICD-10-CM | POA: Insufficient documentation

## 2019-05-26 DIAGNOSIS — H9191 Unspecified hearing loss, right ear: Secondary | ICD-10-CM | POA: Insufficient documentation

## 2019-05-28 ENCOUNTER — Encounter (HOSPITAL_COMMUNITY): Payer: Self-pay | Admitting: Cardiovascular Disease

## 2019-06-11 ENCOUNTER — Telehealth (HOSPITAL_COMMUNITY): Payer: Self-pay | Admitting: Cardiovascular Disease

## 2019-06-11 NOTE — Telephone Encounter (Signed)
Just an FYI. We have made several attempts to contact this patient including sending a letter to schedule or reschedule their Echocardiogram and Myoview We will be removing the patient from the echo WQ.   Mailed letter 05/28/19  05/28/19 Called and unable to leave VM due to full/LBW @ 12:56  10/19 spoke with patient - will call back - gesila  9.25.20 mail reminder letter  9/21 & 9/22 - No show for echo & nuclear         Thank you

## 2019-06-16 ENCOUNTER — Ambulatory Visit: Payer: 59 | Attending: Family

## 2019-06-16 DIAGNOSIS — Z23 Encounter for immunization: Secondary | ICD-10-CM

## 2019-06-16 NOTE — Progress Notes (Signed)
   Covid-19 Vaccination Clinic  Name:  [name]    MRN: [id number] DOB: December 25, 1965  06/16/2019  [name] was observed post Covid-19 immunization for 15 minutes without incident. He was provided with Vaccine Information Sheet and instruction to access the V-Safe system.   [name] was instructed to call 911 with any severe reactions post vaccine: Marland Kitchen Difficulty breathing  . Swelling of face and throat  . A fast heartbeat  . A bad rash all over body  . Dizziness and weakness   Immunizations Administered    Name Date Dose VIS Date Route   Moderna COVID-19 Vaccine 06/16/2019 12:09 PM 0.5 mL 02/10/2019 Intramuscular   Manufacturer: Moderna   Lot: [id number]   [id number]

## 2019-07-14 ENCOUNTER — Ambulatory Visit (INDEPENDENT_AMBULATORY_CARE_PROVIDER_SITE_OTHER): Payer: Medicare Other | Admitting: Pulmonary Disease

## 2019-07-14 ENCOUNTER — Encounter: Payer: Self-pay | Admitting: Pulmonary Disease

## 2019-07-14 ENCOUNTER — Other Ambulatory Visit: Payer: Self-pay

## 2019-07-14 VITALS — BP 118/76 | HR 80 | Temp 98.1°F | Ht 68.0 in | Wt 297.0 lb

## 2019-07-14 DIAGNOSIS — G4733 Obstructive sleep apnea (adult) (pediatric): Secondary | ICD-10-CM | POA: Diagnosis not present

## 2019-07-14 DIAGNOSIS — E669 Obesity, unspecified: Secondary | ICD-10-CM

## 2019-07-14 DIAGNOSIS — J45998 Other asthma: Secondary | ICD-10-CM | POA: Diagnosis not present

## 2019-07-14 DIAGNOSIS — G4730 Sleep apnea, unspecified: Secondary | ICD-10-CM | POA: Diagnosis not present

## 2019-07-14 DIAGNOSIS — Z9989 Dependence on other enabling machines and devices: Secondary | ICD-10-CM

## 2019-07-14 MED ORDER — ALBUTEROL SULFATE HFA 108 (90 BASE) MCG/ACT IN AERS: 2.0000 | INHALATION_SPRAY | RESPIRATORY_TRACT | 5 refills | Status: AC | PRN

## 2019-07-14 NOTE — Patient Instructions (Signed)
Will call you when your letter is ready and when your CPAP download is available  Will provide you with information to schedule follow up appointment with [facility] in [place]  Follow up in 3 months with [facility] in [place]
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
## 2019-07-23 ENCOUNTER — Telehealth: Payer: Self-pay | Admitting: Cardiovascular Disease

## 2019-07-23 NOTE — Telephone Encounter (Signed)
Should not need anything for myovue or echo

## 2019-07-23 NOTE — Telephone Encounter (Signed)
Will forward to Dr. Nishan for advisement. ?

## 2019-07-23 NOTE — Telephone Encounter (Signed)
   Pt said last time he had stress test and echo DR. Nishan prescribed him a pill that made him relax since he is claustrophobic. He wanted to know since he has echo and stress test coming soon if Dr. Johnsie Cancel prescribe him the same pill  Please advise

## 2019-07-29 ENCOUNTER — Telehealth: Payer: Self-pay | Admitting: Cardiovascular Disease

## 2019-07-29 NOTE — Telephone Encounter (Signed)
That's fine

## 2019-07-29 NOTE — Telephone Encounter (Signed)
Patient stated he was SOB with activity, extremely fatigued, and low HR 46 earlier before and while cutting the grass. Afterwards patient stated his HR is in the 90's and feels okay. Patient stated he has been feeling really fatigued the last 3 weeks. Patient is scheduled to get echo and stress test first week of June. Patient denies any chest pain. Encouraged patient to go to the ED if symptoms get worse. Patient has appointment with Cecilie Kicks NP on Tuesday, first available. Will forward to Dr. Johnsie Cancel for further advisement.

## 2019-07-29 NOTE — Telephone Encounter (Signed)
New Message   Patient is calling because he has been feeling extremely fatigued since Monday. He states that he cut his grass but it took him longer than usual. He spoke with his PCP and they wanted him to have the echo and stress test which has been scheduled. The PCP also suggested he follow up with his cardiologist. He has been scheduled for 5/25 with Cecilie Kicks and placed on the wait list. But he would like to speak with the nurse as well. Please call.

## 2019-07-29 NOTE — Telephone Encounter (Signed)
Called patient back with Dr. Nishan's message. Patient verbalized understanding. 
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
## 2019-08-04 ENCOUNTER — Telehealth (HOSPITAL_COMMUNITY): Payer: Self-pay | Admitting: *Deleted

## 2019-08-04 ENCOUNTER — Ambulatory Visit (INDEPENDENT_AMBULATORY_CARE_PROVIDER_SITE_OTHER): Payer: Medicare Other | Admitting: Cardiology

## 2019-08-04 ENCOUNTER — Other Ambulatory Visit: Payer: Self-pay

## 2019-08-04 ENCOUNTER — Encounter: Payer: Self-pay | Admitting: *Deleted

## 2019-08-04 ENCOUNTER — Telehealth: Payer: Self-pay | Admitting: Radiology

## 2019-08-04 ENCOUNTER — Encounter: Payer: Self-pay | Admitting: Cardiology

## 2019-08-04 VITALS — BP 118/78 | HR 75 | Ht 68.0 in | Wt 298.0 lb

## 2019-08-04 DIAGNOSIS — R5383 Other fatigue: Secondary | ICD-10-CM

## 2019-08-04 DIAGNOSIS — E7849 Other hyperlipidemia: Secondary | ICD-10-CM

## 2019-08-04 DIAGNOSIS — Z9989 Dependence on other enabling machines and devices: Secondary | ICD-10-CM

## 2019-08-04 DIAGNOSIS — R079 Chest pain, unspecified: Secondary | ICD-10-CM | POA: Diagnosis not present

## 2019-08-04 DIAGNOSIS — I495 Sick sinus syndrome: Secondary | ICD-10-CM | POA: Diagnosis not present

## 2019-08-04 DIAGNOSIS — G4733 Obstructive sleep apnea (adult) (pediatric): Secondary | ICD-10-CM

## 2019-08-04 LAB — T4, FREE: Free T4: 1.2 ng/dL (ref 0.82–1.77)

## 2019-08-04 LAB — TSH: TSH: 1.42 u[IU]/mL (ref 0.450–4.500)

## 2019-08-04 MED ORDER — ATORVASTATIN CALCIUM 10 MG PO TABS
10.0000 mg | ORAL_TABLET | Freq: Every day | ORAL | 3 refills | Status: DC
Start: 2019-08-04 — End: 2019-08-21

## 2019-08-04 MED ORDER — DIAZEPAM 5 MG PO TABS: ORAL_TABLET | ORAL | 0 refills | Status: DC

## 2019-08-04 NOTE — Telephone Encounter (Signed)
Patient given detailed instructions per Myocardial Perfusion Study Information Sheet for the test on 08/05/19. Patient notified to arrive 15 minutes early and that it is imperative to arrive on time for appointment to keep from having the test rescheduled.  If you need to cancel or reschedule your appointment, please call the office within 24 hours of your appointment. . Patient verbalized understanding. [place], Mary Jacqueline    

## 2019-08-04 NOTE — Telephone Encounter (Signed)
Enrolled patient for a 14 day Zio AT monitor to be mailed to patients home.  

## 2019-08-04 NOTE — Patient Instructions (Addendum)
Medication Instructions:  1) START Atorvastatin 10mg  once daily 2) You may take Valium 5mg  one hour prior to your stress test each day 3) START Aspirin 81mg  once daily  *If you need a refill on your cardiac medications before your next appointment, please call your pharmacy*   Lab Work: TSH and Free T4 today  Your physician recommends that you return for lab work in: 6 weeks.  You will need to be fasting for these labs (nothing to eat or drink after midnight except water and black coffee).  If you have labs (blood work) drawn today and your tests are completely normal, you will receive your results only by: Marland Kitchen MyChart Message (if you have MyChart) OR . A paper copy in the mail If you have any lab test that is abnormal or we need to change your treatment, we will call you to review the results.   Testing/Procedures: Your physician has requested that you have a lexiscan myoview (please move current appt up to this week if possible). For further information please visit [email or web address]. Please follow instruction sheet, as given.  Your provider recommends that you wear a heart monitor for 14 days.  This will be mailed to your home.     Follow-Up: At [facility], you and your health needs are our priority.  As part of our continuing mission to provide you with exceptional heart care, we have created designated Provider Care Teams.  These Care Teams include your primary Cardiologist (physician) and Advanced Practice Providers (APPs -  Physician Assistants and Nurse Practitioners) who all work together to provide you with the care you need, when you need it.  We recommend signing up for the patient portal called "MyChart".  Sign up information is provided on this After Visit Summary.  MyChart is used to connect with patients for Virtual Visits (Telemedicine).  Patients are able to view lab/test results, encounter notes, upcoming appointments, etc.  Non-urgent messages can be sent to your  provider as well.   To learn more about what you can do with MyChart, go to [email or web address].    Your next appointment:   2 week(s)  The format for your next appointment:   In Person  Provider:   You may see Dr. Jenkins Rouge or one of the following Advanced Practice Providers on your designated Care Team:    Truitt Merle, NP  Cecilie Kicks, NP  Kathyrn Drown, NP    Other Instructions

## 2019-08-05 ENCOUNTER — Ambulatory Visit (HOSPITAL_COMMUNITY): Payer: Medicare Other | Attending: Cardiology

## 2019-08-05 ENCOUNTER — Telehealth: Payer: Self-pay | Admitting: Cardiovascular Disease

## 2019-08-05 VITALS — Ht 68.0 in | Wt 298.0 lb

## 2019-08-05 DIAGNOSIS — R079 Chest pain, unspecified: Secondary | ICD-10-CM

## 2019-08-05 MED ORDER — TECHNETIUM TC 99M TETROFOSMIN IV KIT
31.4000 | PACK | Freq: Once | INTRAVENOUS | Status: AC | PRN
  Administered 2019-08-05: 31.4 via INTRAVENOUS
  Filled 2019-08-05: qty 32

## 2019-08-05 MED ORDER — REGADENOSON 0.4 MG/5ML IV SOLN
0.4000 mg | Freq: Once | INTRAVENOUS | Status: AC
  Administered 2019-08-05: 0.4 mg via INTRAVENOUS

## 2019-08-05 NOTE — Telephone Encounter (Signed)
   Went to chart to check who called pt. transferred call to [employer]

## 2019-08-06 ENCOUNTER — Other Ambulatory Visit: Payer: Self-pay

## 2019-08-06 ENCOUNTER — Ambulatory Visit (HOSPITAL_COMMUNITY): Payer: Medicare Other | Attending: Cardiology

## 2019-08-06 LAB — MYOCARDIAL PERFUSION IMAGING
LV dias vol: 109 mL (ref 62–150)
LV sys vol: 51 mL
Peak HR: 104 {beats}/min
Rest HR: 104 {beats}/min
SDS: 6
SRS: 0
SSS: 6
TID: 1.1

## 2019-08-06 MED ORDER — TECHNETIUM TC 99M TETROFOSMIN IV KIT
32.3000 | PACK | Freq: Once | INTRAVENOUS | Status: AC | PRN
  Administered 2019-08-06: 32.3 via INTRAVENOUS
  Filled 2019-08-06: qty 33

## 2019-08-11 ENCOUNTER — Ambulatory Visit (INDEPENDENT_AMBULATORY_CARE_PROVIDER_SITE_OTHER): Payer: Medicare Other

## 2019-08-11 DIAGNOSIS — I495 Sick sinus syndrome: Secondary | ICD-10-CM

## 2019-08-12 ENCOUNTER — Telehealth: Payer: Self-pay | Admitting: Cardiovascular Disease

## 2019-08-12 ENCOUNTER — Other Ambulatory Visit: Payer: Self-pay

## 2019-08-12 ENCOUNTER — Ambulatory Visit (HOSPITAL_COMMUNITY): Payer: Medicare Other | Attending: Cardiology

## 2019-08-12 DIAGNOSIS — R0600 Dyspnea, unspecified: Secondary | ICD-10-CM

## 2019-08-12 MED ORDER — PERFLUTREN LIPID MICROSPHERE
1.0000 mL | INTRAVENOUS | Status: AC | PRN
  Administered 2019-08-12: 2 mL via INTRAVENOUS

## 2019-08-12 NOTE — Telephone Encounter (Signed)
  Patient returned call from nurse. I transferred the call to her.

## 2019-08-14 ENCOUNTER — Other Ambulatory Visit (HOSPITAL_COMMUNITY): Payer: Medicare Other
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
## 2019-08-18 ENCOUNTER — Encounter: Payer: Self-pay | Admitting: Cardiovascular Disease

## 2019-08-18 NOTE — Telephone Encounter (Signed)
error 

## 2019-08-21 ENCOUNTER — Ambulatory Visit (INDEPENDENT_AMBULATORY_CARE_PROVIDER_SITE_OTHER): Payer: Medicare Other | Admitting: Cardiology

## 2019-08-21 ENCOUNTER — Encounter: Payer: Self-pay | Admitting: Cardiology

## 2019-08-21 ENCOUNTER — Other Ambulatory Visit: Payer: Self-pay

## 2019-08-21 VITALS — BP 110/78 | HR 72 | Ht 68.0 in | Wt 297.8 lb

## 2019-08-21 DIAGNOSIS — I208 Other forms of angina pectoris: Secondary | ICD-10-CM | POA: Diagnosis not present

## 2019-08-21 DIAGNOSIS — I10 Essential (primary) hypertension: Secondary | ICD-10-CM | POA: Diagnosis not present

## 2019-08-21 DIAGNOSIS — R931 Abnormal findings on diagnostic imaging of heart and coronary circulation: Secondary | ICD-10-CM

## 2019-08-21 DIAGNOSIS — R5383 Other fatigue: Secondary | ICD-10-CM | POA: Diagnosis not present

## 2019-08-21 MED ORDER — METOPROLOL TARTRATE 50 MG PO TABS
ORAL_TABLET | ORAL | 0 refills | Status: DC
Start: 2019-08-21 — End: 2019-10-26

## 2019-08-21 MED ORDER — DIAZEPAM 5 MG PO TABS
ORAL_TABLET | ORAL | 0 refills | Status: DC
Start: 2019-08-21 — End: 2019-10-26

## 2019-08-21 NOTE — Patient Instructions (Signed)
Medication Instructions:   Your physician has recommended you make the following change in your medication:   1) Start Crestor 10 mg, 1 tablet by mouth once a day 2) Take Valium 5 mg, 1 tablet by mouth 2 hours prior to test 3) Take Metoprolol 50 mg, 1 tablet by mouth 2 hours prior to test  *If you need a refill on your cardiac medications before your next appointment, please call your pharmacy*  Lab Work:  None ordered today  Testing/Procedures:  Your cardiac CT will be scheduled at one of the below locations:   [facility] [address] [place], [place] [address] [phone]  [place] [address] [place], [place] [address] [phone]  If scheduled at [facility], please arrive at the [facility] main entrance of [facility] 30 minutes prior to test start time. Proceed to the [facility] Radiology Department (first floor) to check-in and test prep.  If scheduled at [facility], please arrive 15 mins early for check-in and test prep.  Please follow these instructions carefully (unless otherwise directed):  On the Night Before the Test: . Be sure to Drink plenty of water. . Do not consume any caffeinated/decaffeinated beverages or chocolate 12 hours prior to your test. . Do not take any antihistamines 12 hours prior to your test.  On the Day of the Test: . Drink plenty of water. Do not drink any water within one hour of the test. . Do not eat any food 4 hours prior to the test. . You may take your regular medications prior to the test.  . Take metoprolol (Lopressor) two hours prior to test. . HOLD Furosemide/Hydrochlorothiazide morning of the test.      After the Test: . Drink plenty of water. . After receiving IV contrast, you may experience a mild flushed feeling. This is normal. . On occasion, you may experience a mild rash up to 24 hours after the  test. This is not dangerous. If this occurs, you can take Benadryl 25 mg and increase your fluid intake. . If you experience trouble breathing, this can be serious. If it is severe call 911 IMMEDIATELY. If it is mild, please call our office.  Once we have confirmed authorization from your insurance company, we will call you to set up a date and time for your test.   For non-scheduling related questions, please contact the cardiac imaging nurse navigator should you have any questions/concerns: Marchia Bond, Cardiac Imaging Nurse Navigator Burley Saver, Interim Cardiac Imaging Nurse [place] [facility] Direct Office Dial: [phone]   For scheduling needs, including cancellations and rescheduling, please call [phone].   Follow-Up:  On 10/26/19 at 8:00AM with Jenkins Rouge, MD

## 2019-08-24 ENCOUNTER — Encounter (HOSPITAL_COMMUNITY): Payer: Medicare Other

## 2019-08-25 ENCOUNTER — Ambulatory Visit (HOSPITAL_COMMUNITY): Payer: Medicare Other

## 2019-09-09 ENCOUNTER — Telehealth (HOSPITAL_COMMUNITY): Payer: Self-pay | Admitting: *Deleted

## 2019-09-09 NOTE — Telephone Encounter (Signed)
Attempted to call patient regarding upcoming cardiac CT appointment. Left message on voicemail with name and callback number  Yvonnia Tango Tai RN Navigator Cardiac Imaging [facility] [phone] Office [phone] Cell 

## 2019-09-10 ENCOUNTER — Other Ambulatory Visit: Payer: Self-pay

## 2019-09-10 ENCOUNTER — Ambulatory Visit (HOSPITAL_COMMUNITY): Payer: Medicare Other

## 2019-09-10 ENCOUNTER — Ambulatory Visit (HOSPITAL_COMMUNITY)
Admission: RE | Admit: 2019-09-10 | Discharge: 2019-09-10 | Disposition: A | Discharge status: Home / Self Care | Payer: Medicare Other | Source: Ambulatory Visit | Attending: Cardiology | Admitting: Cardiology

## 2019-09-10 DIAGNOSIS — I208 Other forms of angina pectoris: Secondary | ICD-10-CM | POA: Insufficient documentation

## 2019-09-10 MED ORDER — ACETAMINOPHEN 500 MG PO TABS
ORAL_TABLET | ORAL | Status: AC
  Filled 2019-09-10: qty 2

## 2019-09-10 MED ORDER — IOHEXOL 350 MG/ML SOLN
80.0000 mL | Freq: Once | INTRAVENOUS | Status: AC | PRN
  Administered 2019-09-10: 80 mL via INTRAVENOUS

## 2019-09-10 MED ORDER — NITROGLYCERIN 0.4 MG SL SUBL
SUBLINGUAL_TABLET | SUBLINGUAL | Status: AC
  Filled 2019-09-10: qty 2

## 2019-09-10 MED ORDER — ACETAMINOPHEN 500 MG PO TABS
1000.0000 mg | ORAL_TABLET | Freq: Once | ORAL | Status: AC
  Administered 2019-09-10: 1000 mg via ORAL
  Filled 2019-09-10: qty 2

## 2019-09-10 MED ORDER — NITROGLYCERIN 0.4 MG SL SUBL: 0.8000 mg | SUBLINGUAL_TABLET | Freq: Once | SUBLINGUAL | Status: DC

## 2019-09-18 ENCOUNTER — Other Ambulatory Visit: Payer: Medicare Other | Admitting: *Deleted

## 2019-09-18 ENCOUNTER — Other Ambulatory Visit: Payer: Self-pay

## 2019-09-18 DIAGNOSIS — E7849 Other hyperlipidemia: Secondary | ICD-10-CM

## 2019-09-18 LAB — LIPID PANEL
Chol/HDL Ratio: 4.4 ratio (ref 0.0–5.0)
Cholesterol, Total: 196 mg/dL (ref 100–199)
HDL: 45 mg/dL (ref >39)
LDL Chol Calc (NIH): 135 mg/dL — ABNORMAL HIGH (ref 0–99)
Triglycerides: 87 mg/dL (ref 0–149)
VLDL Cholesterol Cal: 16 mg/dL (ref 5–40)

## 2019-09-18 LAB — HEPATIC FUNCTION PANEL
ALT: 44 IU/L (ref 0–44)
AST: 40 IU/L (ref 0–40)
Albumin: 4.5 g/dL (ref 3.8–4.9)
Alkaline Phosphatase: 71 IU/L (ref 48–121)
Bilirubin Total: 0.4 mg/dL (ref 0.0–1.2)
Bilirubin, Direct: 0.1 mg/dL (ref 0.00–0.40)
Total Protein: 7.2 g/dL (ref 6.0–8.5)

## 2019-09-21 ENCOUNTER — Telehealth: Payer: Self-pay | Admitting: Cardiovascular Disease

## 2019-09-21 NOTE — Telephone Encounter (Signed)
-----   Message from Isaiah Serge, NP sent at 09/18/2019  4:57 PM EDT ----- Is pt taking the lipitor 10 ?  If so need to increase to 20 mg if not then he should begin the 10 mg.

## 2019-09-21 NOTE — Telephone Encounter (Signed)
Patient is requesting to discuss results from lab work completed on 09/18/19. Please call.

## 2019-09-21 NOTE — Telephone Encounter (Signed)
Returned call to pt .  See result note.

## 2019-09-22 ENCOUNTER — Telehealth: Payer: Self-pay | Admitting: *Deleted

## 2019-09-22 NOTE — Telephone Encounter (Signed)
-----   Message from Isaiah Serge, NP sent at 09/21/2019  5:06 PM EDT ----- It is his choice but the hope is that in 10 years he will still not have coronary artery disease.  It is used to prevent blockages.
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
## 2019-10-26 ENCOUNTER — Encounter: Payer: Self-pay | Admitting: Cardiovascular Disease

## 2019-10-26 ENCOUNTER — Other Ambulatory Visit: Payer: Self-pay

## 2019-10-26 ENCOUNTER — Ambulatory Visit (INDEPENDENT_AMBULATORY_CARE_PROVIDER_SITE_OTHER): Payer: Medicare Other | Admitting: Cardiovascular Disease

## 2019-10-26 VITALS — BP 118/78 | HR 68 | Ht 69.0 in | Wt 293.0 lb

## 2019-10-26 DIAGNOSIS — I208 Other forms of angina pectoris: Secondary | ICD-10-CM | POA: Diagnosis not present

## 2019-10-26 DIAGNOSIS — R079 Chest pain, unspecified: Secondary | ICD-10-CM

## 2019-10-26 NOTE — Patient Instructions (Addendum)
Medication Instructions:  *If you need a refill on your cardiac medications before your next appointment, please call your pharmacy*  Lab Work: If you have labs (blood work) drawn today and your tests are completely normal, you will receive your results only by: . MyChart Message (if you have MyChart) OR . A paper copy in the mail If you have any lab test that is abnormal or we need to change your treatment, we will call you to review the results.  Testing/Procedures: None ordered today.   Follow-Up: At [facility], you and your health needs are our priority.  As part of our continuing mission to provide you with exceptional heart care, we have created designated Provider Care Teams.  These Care Teams include your primary Cardiologist (physician) and Advanced Practice Providers (APPs -  Physician Assistants and Nurse Practitioners) who all work together to provide you with the care you need, when you need it.  We recommend signing up for the patient portal called "MyChart".  Sign up information is provided on this After Visit Summary.  MyChart is used to connect with patients for Virtual Visits (Telemedicine).  Patients are able to view lab/test results, encounter notes, upcoming appointments, etc.  Non-urgent messages can be sent to your provider as well.   To learn more about what you can do with MyChart, go to [email or web address].    Your next appointment:   1 year  The format for your next appointment:   In Person  Provider:   You may see Dr. Nishan or one of the following Advanced Practice Providers on your designated Care Team:    Lori Gerhardt, NP  Laura Ingold, NP  Jill McDaniel, NP    

## 2019-11-17 ENCOUNTER — Ambulatory Visit (INDEPENDENT_AMBULATORY_CARE_PROVIDER_SITE_OTHER): Payer: Medicare Other | Admitting: Pulmonary Disease

## 2019-11-17 ENCOUNTER — Other Ambulatory Visit: Payer: Self-pay

## 2019-11-17 ENCOUNTER — Encounter: Payer: Self-pay | Admitting: Pulmonary Disease

## 2019-11-17 VITALS — BP 120/74 | HR 70 | Temp 96.6°F | Ht 69.0 in | Wt 290.8 lb

## 2019-11-17 DIAGNOSIS — Z9989 Dependence on other enabling machines and devices: Secondary | ICD-10-CM

## 2019-11-17 DIAGNOSIS — G4733 Obstructive sleep apnea (adult) (pediatric): Secondary | ICD-10-CM

## 2019-11-17 DIAGNOSIS — Z7189 Other specified counseling: Secondary | ICD-10-CM

## 2019-11-17 DIAGNOSIS — E669 Obesity, unspecified: Secondary | ICD-10-CM

## 2019-11-17 DIAGNOSIS — I208 Other forms of angina pectoris: Secondary | ICD-10-CM

## 2019-11-17 DIAGNOSIS — J4531 Mild persistent asthma with (acute) exacerbation: Secondary | ICD-10-CM | POA: Diagnosis not present
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
## 2019-11-17 NOTE — Patient Instructions (Signed)
Will arrange for new CPAP machine  Follow up in 3 months in [place] office

## 2019-11-25 ENCOUNTER — Telehealth: Payer: Self-pay | Admitting: Pulmonary Disease

## 2019-11-25 NOTE — Telephone Encounter (Signed)
Spoke with the pt  He states [facility] did not receive our CPAP order  Forwarding to [facility] since order placed and sent  Pt would like an update thanks

## 2019-11-26 NOTE — Telephone Encounter (Signed)
Cm sent to [facility] we show they have the order but I have sent it again

## 2019-11-26 NOTE — Telephone Encounter (Signed)
[name]  [place], Clarene Critchley C; Eduardo Osier D Yes we have it. The Repap department is processing order and needed a new order. I have sent them the order from 9/7.   Left message for patient

## 2019-11-30 NOTE — Telephone Encounter (Signed)
[facility] has received all they need form [place] and pt was spoken to on 11/26/19 and informed that is is being worked on by the repap department Joellen [place]

## 2019-11-30 NOTE — Telephone Encounter (Signed)
Sent message to [facility] to see if there is anything else they need for this pt [name]

## 2019-12-15 ENCOUNTER — Ambulatory Visit: Payer: Medicare Other

## 2019-12-15 ENCOUNTER — Encounter: Payer: 59 | Admitting: Podiatry

## 2020-01-04 NOTE — Progress Notes (Signed)
This encounter was created in error - please disregard.

## 2020-01-28 ENCOUNTER — Other Ambulatory Visit (HOSPITAL_COMMUNITY): Payer: Self-pay | Admitting: Chiropractic Medicine

## 2020-01-28 DIAGNOSIS — M5414 Radiculopathy, thoracic region: Secondary | ICD-10-CM

## 2020-01-28 DIAGNOSIS — M546 Pain in thoracic spine: Secondary | ICD-10-CM

## 2020-02-01 ENCOUNTER — Telehealth: Payer: Self-pay | Admitting: Pulmonary Disease

## 2020-02-01 NOTE — Telephone Encounter (Signed)
Spoke with the pt  He is asking for ok from Dr Halford Chessman for sedation prior to MRI scheduled for 02/23/20  He is unsure what type of sedation will be used  He has appt with Dr Halford Chessman on 02/12/20 and will discuss this with him then  Nothing further needed

## 2020-02-12 ENCOUNTER — Ambulatory Visit: Payer: Medicare Other | Admitting: Pulmonary Disease

## 2020-02-12 ENCOUNTER — Other Ambulatory Visit: Payer: Self-pay

## 2020-02-12 ENCOUNTER — Encounter: Payer: Self-pay | Admitting: Pulmonary Disease

## 2020-02-12 ENCOUNTER — Ambulatory Visit (INDEPENDENT_AMBULATORY_CARE_PROVIDER_SITE_OTHER): Payer: Medicare Other | Admitting: Pulmonary Disease

## 2020-02-12 VITALS — BP 122/74 | HR 60 | Temp 97.3°F | Ht 69.0 in | Wt 283.3 lb

## 2020-02-12 DIAGNOSIS — J4531 Mild persistent asthma with (acute) exacerbation: Secondary | ICD-10-CM

## 2020-02-12 DIAGNOSIS — Z9989 Dependence on other enabling machines and devices: Secondary | ICD-10-CM

## 2020-02-12 DIAGNOSIS — G4733 Obstructive sleep apnea (adult) (pediatric): Secondary | ICD-10-CM | POA: Diagnosis not present

## 2020-02-12 DIAGNOSIS — I208 Other forms of angina pectoris: Secondary | ICD-10-CM

## 2020-02-12 NOTE — Patient Instructions (Signed)
Follow up in 6 months 
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
## 2020-02-20 ENCOUNTER — Ambulatory Visit (HOSPITAL_COMMUNITY)
Admission: RE | Admit: 2020-02-20 | Discharge: 2020-02-20 | Disposition: A | Discharge status: Home / Self Care | Payer: Medicare Other | Source: Ambulatory Visit | Attending: Chiropractic Medicine | Admitting: Chiropractic Medicine

## 2020-02-20 DIAGNOSIS — Z01812 Encounter for preprocedural laboratory examination: Secondary | ICD-10-CM | POA: Diagnosis not present

## 2020-02-20 DIAGNOSIS — Z20822 Contact with and (suspected) exposure to covid-19: Secondary | ICD-10-CM | POA: Insufficient documentation

## 2020-02-21 LAB — SARS CORONAVIRUS 2 (TAT 6-24 HRS): SARS Coronavirus 2: NEGATIVE

## 2020-02-22 ENCOUNTER — Encounter (HOSPITAL_COMMUNITY): Payer: Self-pay

## 2020-02-22 ENCOUNTER — Other Ambulatory Visit: Payer: Self-pay

## 2020-02-22 NOTE — Progress Notes (Signed)
PCP - Dr. Kathyrn Lass  Cardiologist - Dr. Johnsie Cancel  Chest x-ray - n/a EKG - 08/04/19 Stress Test - 08/05/19 ECHO - 08/12/19 Cardiac Cath - denies   Sleep Study - yes  CPAP - yes - 7  COVID TEST- 12/11 neg  Anesthesia review: n/a   -------------  SDW INSTRUCTIONS:  Your procedure is scheduled on 12/14.  Report to [facility] Main Entrance "A" at 0800 A.M., and check in at the Admitting office.  Call this number if you have problems the morning of surgery: [phone]   Remember: Do not eat or drink after midnight the night before your surgery  Take these medicines the morning of surgery with A SIP OF WATER: NONE  If needed: albuterol (VENTOLIN HFA) 108 (90 Base) budesonide-formoterol (SYMBICORT)   As of today, STOP taking any Aspirin (unless otherwise instructed by your surgeon), Aleve, Naproxen, Ibuprofen, Motrin, Advil, Goody's, BC's, all herbal medications, fish oil, and all vitamins.    The Morning of Surgery  Do not wear jewelry  Do not wear lotions, powders, colognes, or deodorant Men may shave face and neck.  Do not bring valuables to the hospital.  [facility] is not responsible for any belongings or valuables.  If you are a smoker, DO NOT Smoke 24 hours prior to surgery  If you wear a CPAP at night please bring your mask the morning of surgery   Remember that you must have someone to transport you home after your surgery, and remain with you for 24 hours if you are discharged the same day.  Please bring cases for contacts, glasses, hearing aids, dentures or bridgework because it cannot be worn into surgery.   Leave your suitcase in the car.  After surgery it may be brought to your room.  For patients admitted to the hospital, discharge time will be determined by your treatment team.  Patients discharged the day of surgery will not be allowed to drive home.    Special instructions:   [facility]- Preparing For Surgery  Oral Hygiene is also important to  reduce your risk of infection.  Remember - BRUSH YOUR TEETH THE MORNING OF SURGERY WITH YOUR REGULAR TOOTHPASTE  Please follow these instructions carefully.   1. Shower the NIGHT BEFORE SURGERY and the MORNING OF SURGERY with DIAL Soap.   2. Wash thoroughly, paying special attention to the area where your surgery will be performed.  3. Thoroughly rinse your body with warm water from the neck down.  4. Pat yourself dry with a CLEAN TOWEL.  5. Wear CLEAN PAJAMAS to bed the night before surgery  6. Place CLEAN SHEETS on your bed the night of your first shower and DO NOT SLEEP WITH PETS.  7. Wear comfortable clothes the morning of surgery.    Day of Surgery:  Please shower the morning of surgery with the DIAL soap Do not apply any deodorants/lotions. Please wear clean clothes to the hospital/surgery center.   Remember to brush your teeth WITH YOUR REGULAR TOOTHPASTE.   Please read over the following fact sheets that you were given.  Patient denies shortness of breath, fever, cough and chest pain.

## 2020-02-23 ENCOUNTER — Ambulatory Visit (HOSPITAL_COMMUNITY): Payer: Medicare Other | Admitting: Vascular Surgery

## 2020-02-23 ENCOUNTER — Ambulatory Visit (HOSPITAL_COMMUNITY)
Admission: RE | Admit: 2020-02-23 | Discharge: 2020-02-23 | Disposition: A | Discharge status: Home / Self Care | Payer: Medicare Other | Attending: Chiropractic Medicine | Admitting: Chiropractic Medicine

## 2020-02-23 ENCOUNTER — Encounter (HOSPITAL_COMMUNITY): Payer: Self-pay

## 2020-02-23 ENCOUNTER — Encounter (HOSPITAL_COMMUNITY): Admission: RE | Disposition: A | Discharge status: Home / Self Care | Payer: Self-pay | Source: Home / Self Care

## 2020-02-23 ENCOUNTER — Ambulatory Visit (HOSPITAL_COMMUNITY)
Admission: RE | Admit: 2020-02-23 | Discharge: 2020-02-23 | Disposition: A | Discharge status: Home / Self Care | Payer: Medicare Other | Source: Ambulatory Visit | Attending: Chiropractic Medicine | Admitting: Chiropractic Medicine

## 2020-02-23 ENCOUNTER — Other Ambulatory Visit: Payer: Self-pay

## 2020-02-23 DIAGNOSIS — M4804 Spinal stenosis, thoracic region: Secondary | ICD-10-CM | POA: Insufficient documentation

## 2020-02-23 DIAGNOSIS — M5124 Other intervertebral disc displacement, thoracic region: Secondary | ICD-10-CM | POA: Insufficient documentation

## 2020-02-23 DIAGNOSIS — M47814 Spondylosis without myelopathy or radiculopathy, thoracic region: Secondary | ICD-10-CM | POA: Diagnosis not present

## 2020-02-23 DIAGNOSIS — M5134 Other intervertebral disc degeneration, thoracic region: Secondary | ICD-10-CM | POA: Diagnosis not present

## 2020-02-23 DIAGNOSIS — M5414 Radiculopathy, thoracic region: Secondary | ICD-10-CM | POA: Insufficient documentation

## 2020-02-23 DIAGNOSIS — M4854XA Collapsed vertebra, not elsewhere classified, thoracic region, initial encounter for fracture: Secondary | ICD-10-CM | POA: Insufficient documentation

## 2020-02-23 DIAGNOSIS — M546 Pain in thoracic spine: Secondary | ICD-10-CM | POA: Insufficient documentation

## 2020-02-23 HISTORY — PX: RADIOLOGY WITH ANESTHESIA: SHX6223

## 2020-02-23 SURGERY — MRI WITH ANESTHESIA
Anesthesia: General

## 2020-02-23 MED ORDER — CHLORHEXIDINE GLUCONATE 0.12 % MT SOLN
15.0000 mL | Freq: Once | OROMUCOSAL | Status: AC
  Administered 2020-02-23: 15 mL via OROMUCOSAL
  Filled 2020-02-23: qty 15

## 2020-02-23 MED ORDER — SUCCINYLCHOLINE CHLORIDE 200 MG/10ML IV SOSY
PREFILLED_SYRINGE | INTRAVENOUS | Status: DC | PRN
  Administered 2020-02-23: 160 mg via INTRAVENOUS

## 2020-02-23 MED ORDER — PHENYLEPHRINE HCL-NACL 10-0.9 MG/250ML-% IV SOLN
INTRAVENOUS | Status: DC | PRN
  Administered 2020-02-23: 20 ug/min via INTRAVENOUS

## 2020-02-23 MED ORDER — DEXAMETHASONE SODIUM PHOSPHATE 10 MG/ML IJ SOLN
INTRAMUSCULAR | Status: DC | PRN
  Administered 2020-02-23: 10 mg via INTRAVENOUS

## 2020-02-23 MED ORDER — LACTATED RINGERS IV SOLN: INTRAVENOUS | Status: DC

## 2020-02-23 MED ORDER — LIDOCAINE 2% (20 MG/ML) 5 ML SYRINGE
INTRAMUSCULAR | Status: DC | PRN
  Administered 2020-02-23: 60 mg via INTRAVENOUS

## 2020-02-23 MED ORDER — ORAL CARE MOUTH RINSE: 15.0000 mL | Freq: Once | OROMUCOSAL | Status: AC

## 2020-02-23 MED ORDER — MIDAZOLAM HCL 2 MG/2ML IJ SOLN
INTRAMUSCULAR | Status: DC | PRN
  Administered 2020-02-23: 2 mg via INTRAVENOUS

## 2020-02-23 MED ORDER — ONDANSETRON HCL 4 MG/2ML IJ SOLN
INTRAMUSCULAR | Status: DC | PRN
  Administered 2020-02-23: 4 mg via INTRAVENOUS

## 2020-02-23 MED ORDER — PROPOFOL 10 MG/ML IV BOLUS
INTRAVENOUS | Status: DC | PRN
  Administered 2020-02-23: 200 mg via INTRAVENOUS

## 2020-02-23 MED ORDER — FENTANYL CITRATE (PF) 250 MCG/5ML IJ SOLN
INTRAMUSCULAR | Status: DC | PRN
  Administered 2020-02-23: 100 ug via INTRAVENOUS

## 2020-02-23 NOTE — Anesthesia Preprocedure Evaluation (Addendum)
Anesthesia Evaluation  Patient identified by MRN, date of birth, ID band Patient awake    Reviewed: Allergy & Precautions, H&P , NPO status , Patient's Chart, lab work & pertinent test results  Airway Mallampati: III  TM Distance: >3 FB Neck ROM: Full    Dental no notable dental hx. (+) Teeth Intact, Dental Advisory Given   Pulmonary asthma , sleep apnea ,    Pulmonary exam normal breath sounds clear to auscultation       Cardiovascular negative cardio ROS   Rhythm:Regular Rate:Normal     Neuro/Psych  Headaches, Anxiety Depression    GI/Hepatic negative GI ROS, Neg liver ROS,   Endo/Other  Morbid obesity  Renal/GU negative Renal ROS  negative genitourinary   Musculoskeletal  (+) Arthritis , Osteoarthritis,    Abdominal   Peds  Hematology negative hematology ROS (+)   Anesthesia Other Findings   Reproductive/Obstetrics negative OB ROS                            Anesthesia Physical Anesthesia Plan  ASA: III  Anesthesia Plan: General   Post-op Pain Management:    Induction: Intravenous  PONV Risk Score and Plan: 2 and Ondansetron, Dexamethasone and Midazolam  Airway Management Planned: Oral ETT  Additional Equipment:   Intra-op Plan:   Post-operative Plan: Extubation in OR  Informed Consent: I have reviewed the patients History and Physical, chart, labs and discussed the procedure including the risks, benefits and alternatives for the proposed anesthesia with the patient or authorized representative who has indicated his/her understanding and acceptance.     Dental advisory given  Plan Discussed with: CRNA  Anesthesia Plan Comments:         Anesthesia Quick Evaluation

## 2020-02-23 NOTE — Anesthesia Procedure Notes (Addendum)
Procedure Name: Intubation Date/Time: 02/23/2020 10:37 AM Performed by: Roderic Palau, MD Pre-anesthesia Checklist: Patient identified, Emergency Drugs available, Suction available and Patient being monitored Patient Re-evaluated:Patient Re-evaluated prior to induction Oxygen Delivery Method: Circle System Utilized Preoxygenation: Pre-oxygenation with 100% oxygen Induction Type: IV induction Ventilation: Two handed mask ventilation required and Oral airway inserted - appropriate to patient size Laryngoscope Size: Glidescope and 4 Grade View: Grade I Tube type: Oral Tube size: 7.5 mm Number of attempts: 1 Airway Equipment and Method: Oral airway,  Video-laryngoscopy and Rigid stylet Placement Confirmation: ETT inserted through vocal cords under direct vision,  positive ETCO2 and breath sounds checked- equal and bilateral Secured at: 23 cm Tube secured with: Tape Dental Injury: Teeth and Oropharynx as per pre-operative assessment

## 2020-02-23 NOTE — Transfer of Care (Signed)
Immediate Anesthesia Transfer of Care Note  Patient: [name]  Procedure(s) Performed: MRI WITH ANESTHESIA THORACIC SPINE WIHTOUT CONTRAST (N/A )  Patient Location: PACU  Anesthesia Type:General  Level of Consciousness: drowsy and patient cooperative  Airway & Oxygen Therapy: Patient Spontanous Breathing  Post-op Assessment: Report given to RN, Post -op Vital signs reviewed and stable and Patient moving all extremities X 4  Post vital signs: Reviewed and stable  Last Vitals:  Vitals Value Taken Time  BP 109/67 02/23/20 1204  Temp    Pulse 69 02/23/20 1218  Resp 17 02/23/20 1218  SpO2 100 % 02/23/20 1218  Vitals shown include unvalidated device data.  Last Pain:  Vitals:   02/23/20 0843  TempSrc:   PainSc: 0-No pain      Patients Stated Pain Goal: 3 (59/13/68 5992)  Complications: No complications documented.

## 2020-02-23 NOTE — Anesthesia Postprocedure Evaluation (Signed)
Anesthesia Post Note  Patient: [name]  Procedure(s) Performed: MRI WITH ANESTHESIA THORACIC SPINE WIHTOUT CONTRAST (N/A )     Patient location during evaluation: PACU Anesthesia Type: General Level of consciousness: awake and alert Pain management: pain level controlled Vital Signs Assessment: post-procedure vital signs reviewed and stable Respiratory status: spontaneous breathing, nonlabored ventilation and respiratory function stable Cardiovascular status: blood pressure returned to baseline and stable Postop Assessment: no apparent nausea or vomiting Anesthetic complications: no   No complications documented.  Last Vitals:  Vitals:   02/23/20 1215 02/23/20 1230  BP: 122/72 104/73  Pulse: 66 75  Resp: (!) 22 (!) 36  Temp:    SpO2: 100% 98%    Last Pain:  Vitals:   02/23/20 1230  TempSrc:   PainSc: 0-No pain                 Caitlen Worth,W. EDMOND

## 2020-02-24 ENCOUNTER — Encounter (HOSPITAL_COMMUNITY): Payer: Self-pay | Admitting: Radiology

## 2020-03-12 ENCOUNTER — Telehealth: Payer: Self-pay | Admitting: Pulmonary Disease

## 2020-03-12 NOTE — Telephone Encounter (Signed)
Patient called answering service for a positive COVID test, not having any symptoms  Vaccinated and boosted  Encouraged to call his PCP if he were to start having concerning symptoms Tylenol for comfort Isolate - spouse and child are also positive

## 2020-04-07 ENCOUNTER — Other Ambulatory Visit: Payer: Self-pay

## 2020-04-07 ENCOUNTER — Encounter: Payer: Self-pay | Admitting: *Deleted

## 2020-04-07 ENCOUNTER — Ambulatory Visit (INDEPENDENT_AMBULATORY_CARE_PROVIDER_SITE_OTHER): Payer: Medicare Other

## 2020-04-07 ENCOUNTER — Ambulatory Visit (INDEPENDENT_AMBULATORY_CARE_PROVIDER_SITE_OTHER): Payer: Medicare Other | Admitting: Podiatry

## 2020-04-07 ENCOUNTER — Encounter: Payer: Self-pay | Admitting: Podiatry

## 2020-04-07 DIAGNOSIS — E785 Hyperlipidemia, unspecified: Secondary | ICD-10-CM | POA: Insufficient documentation

## 2020-04-07 DIAGNOSIS — K921 Melena: Secondary | ICD-10-CM | POA: Insufficient documentation

## 2020-04-07 DIAGNOSIS — L309 Dermatitis, unspecified: Secondary | ICD-10-CM | POA: Insufficient documentation

## 2020-04-07 DIAGNOSIS — Z8371 Family history of colonic polyps: Secondary | ICD-10-CM | POA: Insufficient documentation

## 2020-04-07 DIAGNOSIS — M4306 Spondylolysis, lumbar region: Secondary | ICD-10-CM | POA: Diagnosis not present

## 2020-04-07 DIAGNOSIS — K219 Gastro-esophageal reflux disease without esophagitis: Secondary | ICD-10-CM | POA: Insufficient documentation

## 2020-04-07 DIAGNOSIS — E559 Vitamin D deficiency, unspecified: Secondary | ICD-10-CM | POA: Insufficient documentation

## 2020-04-07 DIAGNOSIS — M19072 Primary osteoarthritis, left ankle and foot: Secondary | ICD-10-CM

## 2020-04-07 DIAGNOSIS — F5101 Primary insomnia: Secondary | ICD-10-CM | POA: Insufficient documentation

## 2020-04-07 DIAGNOSIS — J4551 Severe persistent asthma with (acute) exacerbation: Secondary | ICD-10-CM | POA: Insufficient documentation

## 2020-04-07 DIAGNOSIS — M9903 Segmental and somatic dysfunction of lumbar region: Secondary | ICD-10-CM | POA: Diagnosis not present

## 2020-04-07 DIAGNOSIS — M5432 Sciatica, left side: Secondary | ICD-10-CM | POA: Diagnosis not present

## 2020-04-07 DIAGNOSIS — J4540 Moderate persistent asthma, uncomplicated: Secondary | ICD-10-CM | POA: Insufficient documentation

## 2020-04-07 DIAGNOSIS — M205X2 Other deformities of toe(s) (acquired), left foot: Secondary | ICD-10-CM

## 2020-04-07 DIAGNOSIS — G894 Chronic pain syndrome: Secondary | ICD-10-CM | POA: Insufficient documentation

## 2020-04-07 DIAGNOSIS — R5383 Other fatigue: Secondary | ICD-10-CM | POA: Insufficient documentation

## 2020-04-07 DIAGNOSIS — M9905 Segmental and somatic dysfunction of pelvic region: Secondary | ICD-10-CM | POA: Diagnosis not present

## 2020-04-08 ENCOUNTER — Encounter: Payer: Self-pay | Admitting: Podiatry
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
## 2020-04-11 DIAGNOSIS — G5601 Carpal tunnel syndrome, right upper limb: Secondary | ICD-10-CM | POA: Diagnosis not present

## 2020-04-13 DIAGNOSIS — M9903 Segmental and somatic dysfunction of lumbar region: Secondary | ICD-10-CM | POA: Diagnosis not present

## 2020-04-13 DIAGNOSIS — M5432 Sciatica, left side: Secondary | ICD-10-CM | POA: Diagnosis not present

## 2020-04-13 DIAGNOSIS — M9905 Segmental and somatic dysfunction of pelvic region: Secondary | ICD-10-CM | POA: Diagnosis not present

## 2020-04-13 DIAGNOSIS — M4306 Spondylolysis, lumbar region: Secondary | ICD-10-CM | POA: Diagnosis not present

## 2020-04-15 DIAGNOSIS — R100 Acute abdomen: Secondary | ICD-10-CM | POA: Diagnosis not present

## 2020-04-15 DIAGNOSIS — S4351XA Sprain of right acromioclavicular joint, initial encounter: Secondary | ICD-10-CM | POA: Diagnosis not present

## 2020-04-15 DIAGNOSIS — R1032 Left lower quadrant pain: Secondary | ICD-10-CM | POA: Diagnosis not present

## 2020-04-15 DIAGNOSIS — M7541 Impingement syndrome of right shoulder: Secondary | ICD-10-CM | POA: Diagnosis not present

## 2020-04-19 DIAGNOSIS — M7120 Synovial cyst of popliteal space [Baker], unspecified knee: Secondary | ICD-10-CM | POA: Diagnosis not present

## 2020-04-20 DIAGNOSIS — M5432 Sciatica, left side: Secondary | ICD-10-CM | POA: Diagnosis not present

## 2020-04-20 DIAGNOSIS — M9905 Segmental and somatic dysfunction of pelvic region: Secondary | ICD-10-CM | POA: Diagnosis not present

## 2020-04-20 DIAGNOSIS — M9903 Segmental and somatic dysfunction of lumbar region: Secondary | ICD-10-CM | POA: Diagnosis not present

## 2020-04-20 DIAGNOSIS — M4306 Spondylolysis, lumbar region: Secondary | ICD-10-CM | POA: Diagnosis not present

## 2020-04-22 DIAGNOSIS — M25512 Pain in left shoulder: Secondary | ICD-10-CM | POA: Diagnosis not present

## 2020-04-27 DIAGNOSIS — M4306 Spondylolysis, lumbar region: Secondary | ICD-10-CM | POA: Diagnosis not present

## 2020-04-27 DIAGNOSIS — M5432 Sciatica, left side: Secondary | ICD-10-CM | POA: Diagnosis not present

## 2020-04-27 DIAGNOSIS — M9903 Segmental and somatic dysfunction of lumbar region: Secondary | ICD-10-CM | POA: Diagnosis not present

## 2020-04-27 DIAGNOSIS — M9905 Segmental and somatic dysfunction of pelvic region: Secondary | ICD-10-CM | POA: Diagnosis not present

## 2020-04-29 DIAGNOSIS — M7121 Synovial cyst of popliteal space [Baker], right knee: Secondary | ICD-10-CM | POA: Diagnosis not present

## 2020-05-11 DIAGNOSIS — M4306 Spondylolysis, lumbar region: Secondary | ICD-10-CM | POA: Diagnosis not present

## 2020-05-11 DIAGNOSIS — M9903 Segmental and somatic dysfunction of lumbar region: Secondary | ICD-10-CM | POA: Diagnosis not present

## 2020-05-11 DIAGNOSIS — M9905 Segmental and somatic dysfunction of pelvic region: Secondary | ICD-10-CM | POA: Diagnosis not present

## 2020-05-11 DIAGNOSIS — M25531 Pain in right wrist: Secondary | ICD-10-CM | POA: Diagnosis not present

## 2020-05-11 DIAGNOSIS — M5432 Sciatica, left side: Secondary | ICD-10-CM | POA: Diagnosis not present

## 2020-05-12 DIAGNOSIS — M25511 Pain in right shoulder: Secondary | ICD-10-CM | POA: Diagnosis not present

## 2020-05-19 ENCOUNTER — Encounter: Payer: Self-pay | Admitting: Podiatry

## 2020-05-19 ENCOUNTER — Ambulatory Visit (INDEPENDENT_AMBULATORY_CARE_PROVIDER_SITE_OTHER): Payer: Medicare Other | Admitting: Podiatry

## 2020-05-19 ENCOUNTER — Other Ambulatory Visit: Payer: Self-pay

## 2020-05-19 DIAGNOSIS — M19072 Primary osteoarthritis, left ankle and foot: Secondary | ICD-10-CM | POA: Diagnosis not present

## 2020-05-19 MED ORDER — CLOTRIMAZOLE-BETAMETHASONE 1-0.05 % EX CREA: 1.0000 "application " | TOPICAL_CREAM | Freq: Two times a day (BID) | CUTANEOUS | 0 refills | Status: DC
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
## 2020-05-23 DIAGNOSIS — M25561 Pain in right knee: Secondary | ICD-10-CM | POA: Diagnosis not present

## 2020-05-24 DIAGNOSIS — M5432 Sciatica, left side: Secondary | ICD-10-CM | POA: Diagnosis not present

## 2020-05-24 DIAGNOSIS — M4306 Spondylolysis, lumbar region: Secondary | ICD-10-CM | POA: Diagnosis not present

## 2020-05-24 DIAGNOSIS — M9903 Segmental and somatic dysfunction of lumbar region: Secondary | ICD-10-CM | POA: Diagnosis not present

## 2020-05-24 DIAGNOSIS — M9905 Segmental and somatic dysfunction of pelvic region: Secondary | ICD-10-CM | POA: Diagnosis not present

## 2020-05-30 DIAGNOSIS — G5601 Carpal tunnel syndrome, right upper limb: Secondary | ICD-10-CM | POA: Diagnosis not present

## 2020-06-06 DIAGNOSIS — M65831 Other synovitis and tenosynovitis, right forearm: Secondary | ICD-10-CM | POA: Diagnosis not present

## 2020-06-06 DIAGNOSIS — G5601 Carpal tunnel syndrome, right upper limb: Secondary | ICD-10-CM | POA: Diagnosis not present

## 2020-06-09 ENCOUNTER — Telehealth: Payer: Self-pay | Admitting: Pulmonary Disease

## 2020-06-09 NOTE — Telephone Encounter (Signed)
Pt wanted to see what VS thoughts were on pt taking his mask off during chruch to speak.  He does not want to put himself at risk, but wanted to see if VS would be ok with him removing his mask to speak?  VS please advise. Thanks

## 2020-06-09 NOTE — Telephone Encounter (Signed)
Spoke with pt and reviewed recommendations provided by Dr. Halford Chessman. Pt stated understanding. Nothing further needed at this time.

## 2020-06-09 NOTE — Telephone Encounter (Signed)
Okay to take his mask off to use microphone as long as he maintains social distancing.

## 2020-06-15 DIAGNOSIS — M4306 Spondylolysis, lumbar region: Secondary | ICD-10-CM | POA: Diagnosis not present

## 2020-06-15 DIAGNOSIS — M9905 Segmental and somatic dysfunction of pelvic region: Secondary | ICD-10-CM | POA: Diagnosis not present

## 2020-06-15 DIAGNOSIS — M5432 Sciatica, left side: Secondary | ICD-10-CM | POA: Diagnosis not present

## 2020-06-15 DIAGNOSIS — M9903 Segmental and somatic dysfunction of lumbar region: Secondary | ICD-10-CM | POA: Diagnosis not present

## 2020-06-23 ENCOUNTER — Other Ambulatory Visit: Payer: Self-pay | Admitting: Sports Medicine

## 2020-06-23 ENCOUNTER — Other Ambulatory Visit (HOSPITAL_COMMUNITY): Payer: Self-pay | Admitting: Sports Medicine

## 2020-06-23 DIAGNOSIS — M7121 Synovial cyst of popliteal space [Baker], right knee: Secondary | ICD-10-CM

## 2020-06-24 ENCOUNTER — Other Ambulatory Visit: Payer: Self-pay | Admitting: Sports Medicine

## 2020-06-24 DIAGNOSIS — M7121 Synovial cyst of popliteal space [Baker], right knee: Secondary | ICD-10-CM

## 2020-06-28 ENCOUNTER — Other Ambulatory Visit (HOSPITAL_COMMUNITY): Payer: Self-pay | Admitting: Specialist

## 2020-06-28 DIAGNOSIS — M25511 Pain in right shoulder: Secondary | ICD-10-CM

## 2020-07-04 ENCOUNTER — Ambulatory Visit (INDEPENDENT_AMBULATORY_CARE_PROVIDER_SITE_OTHER): Payer: Medicare Other | Admitting: Gastroenterology

## 2020-07-04 ENCOUNTER — Encounter: Payer: Self-pay | Admitting: Gastroenterology

## 2020-07-04 VITALS — BP 130/88 | HR 92 | Ht 68.0 in | Wt 297.1 lb

## 2020-07-04 DIAGNOSIS — R1032 Left lower quadrant pain: Secondary | ICD-10-CM | POA: Diagnosis not present
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
## 2020-07-04 NOTE — Patient Instructions (Signed)
If you are age 53 or younger, your body mass index should be between 19-25. Your Body mass index is 45.18 kg/m. If this is out of the aformentioned range listed, please consider follow up with your Primary Care Provider.   You have been scheduled for an abdominal ultrasound at [facility] Radiology (1st floor of hospital) on 07-12-2020  at 8:30am. Please arrive 15 minutes prior to your appointment for registration. Make certain not to have anything to eat or drink after midnight the night prior to your appointment. Should you need to reschedule your appointment, please contact radiology at [phone]. This test typically takes about 30 minutes to perform.  Due to recent changes in healthcare laws, you may see the results of your imaging and laboratory studies on MyChart before your provider has had a chance to review them.  We understand that in some cases there may be results that are confusing or concerning to you. Not all laboratory results come back in the same time frame and the provider may be waiting for multiple results in order to interpret others.  Please give [place] 48 hours in order for your provider to thoroughly review all the results before contacting the office for clarification of your results.   Thank you for entrusting me with your care and choosing [facility].  Dr Ardis Hughs

## 2020-07-05 DIAGNOSIS — Z8709 Personal history of other diseases of the respiratory system: Secondary | ICD-10-CM | POA: Diagnosis not present

## 2020-07-05 DIAGNOSIS — F40240 Claustrophobia: Secondary | ICD-10-CM | POA: Diagnosis not present

## 2020-07-05 DIAGNOSIS — Z01818 Encounter for other preprocedural examination: Secondary | ICD-10-CM | POA: Diagnosis not present

## 2020-07-06 DIAGNOSIS — M9905 Segmental and somatic dysfunction of pelvic region: Secondary | ICD-10-CM | POA: Diagnosis not present

## 2020-07-06 DIAGNOSIS — M5432 Sciatica, left side: Secondary | ICD-10-CM | POA: Diagnosis not present

## 2020-07-06 DIAGNOSIS — M4306 Spondylolysis, lumbar region: Secondary | ICD-10-CM | POA: Diagnosis not present

## 2020-07-06 DIAGNOSIS — M9903 Segmental and somatic dysfunction of lumbar region: Secondary | ICD-10-CM | POA: Diagnosis not present

## 2020-07-07 DIAGNOSIS — M25561 Pain in right knee: Secondary | ICD-10-CM | POA: Diagnosis not present

## 2020-07-11 ENCOUNTER — Ambulatory Visit
Admission: RE | Admit: 2020-07-11 | Discharge: 2020-07-11 | Disposition: A | Discharge status: Home / Self Care | Payer: Medicare Other | Source: Ambulatory Visit | Attending: Family Medicine | Admitting: Family Medicine

## 2020-07-11 ENCOUNTER — Other Ambulatory Visit: Payer: Self-pay

## 2020-07-11 DIAGNOSIS — Z01812 Encounter for preprocedural laboratory examination: Secondary | ICD-10-CM | POA: Diagnosis not present

## 2020-07-11 DIAGNOSIS — Z20822 Contact with and (suspected) exposure to covid-19: Secondary | ICD-10-CM | POA: Diagnosis not present

## 2020-07-12 ENCOUNTER — Other Ambulatory Visit: Payer: Self-pay

## 2020-07-12 ENCOUNTER — Ambulatory Visit (HOSPITAL_COMMUNITY)
Admission: RE | Admit: 2020-07-12 | Discharge: 2020-07-12 | Disposition: A | Discharge status: Home / Self Care | Payer: Medicare Other | Source: Ambulatory Visit | Attending: Gastroenterology | Admitting: Gastroenterology

## 2020-07-12 ENCOUNTER — Encounter (HOSPITAL_COMMUNITY): Payer: Self-pay

## 2020-07-12 DIAGNOSIS — R1032 Left lower quadrant pain: Secondary | ICD-10-CM | POA: Insufficient documentation

## 2020-07-12 DIAGNOSIS — K7689 Other specified diseases of liver: Secondary | ICD-10-CM | POA: Diagnosis not present

## 2020-07-12 DIAGNOSIS — K828 Other specified diseases of gallbladder: Secondary | ICD-10-CM | POA: Diagnosis not present

## 2020-07-12 LAB — SARS CORONAVIRUS 2 (TAT 6-24 HRS): SARS Coronavirus 2: NEGATIVE

## 2020-07-12 NOTE — Progress Notes (Signed)
PCP - Dr. Kathyrn Lass  Cardiologist - Dr. Johnsie Cancel   Chest x-ray -  EKG - 08/04/19 Stress Test - 08/05/19 ECHO - 08/12/19 Cardiac Cath -   Sleep Study - yes CPAP - yes settings 7.0  COVID TEST- 07/11/20 negative   Anesthesia review: n/a  -------------  SDW INSTRUCTIONS:  Your procedure is scheduled on 07/14/20. Please report to [facility] Main Entrance "A" at 545 A.M., and check in at the Admitting office. Call this number if you have problems the morning of surgery: [phone]   Remember: Do not eat or drink after midnight the night before your surgery  Medications to take morning of surgery with a sip of water include:  If needed: acetaminophen (TYLENOL) albuterol (VENTOLIN HFA) 108 (90 Base) budesonide-formoterol (SYMBICORT)   As of today, STOP taking any Aspirin (unless otherwise instructed by your surgeon), Aleve, Naproxen, Ibuprofen, Motrin, Advil, Goody's, BC's, all herbal medications, fish oil, and all vitamins.    The Morning of Surgery Do not wear jewelry Do not wear lotions, powders, colognes, or deodorant Men may shave face and neck. Do not bring valuables to the hospital. [facility] is not responsible for any belongings or valuables. If you are a smoker, DO NOT Smoke 24 hours prior to surgery If you wear a CPAP at night please bring your mask the morning of surgery  Remember that you must have someone to transport you home after your surgery, and remain with you for 24 hours if you are discharged the same day. Please bring cases for contacts, glasses, hearing aids, dentures or bridgework because it cannot be worn into surgery.   Patients discharged the day of surgery will not be allowed to drive home.   Please shower the NIGHT BEFORE SURGERY and the MORNING OF SURGERY with DIAL Soap. Wear comfortable clothes the morning of surgery. Oral Hygiene is also important to reduce your risk of infection.  Remember - BRUSH YOUR TEETH THE MORNING OF SURGERY WITH YOUR  REGULAR TOOTHPASTE  Patient denies shortness of breath, fever, cough and chest pain.

## 2020-07-14 ENCOUNTER — Ambulatory Visit (HOSPITAL_COMMUNITY): Payer: Medicare Other | Admitting: Certified Registered"

## 2020-07-14 ENCOUNTER — Ambulatory Visit (HOSPITAL_COMMUNITY)
Admission: RE | Admit: 2020-07-14 | Discharge: 2020-07-14 | Disposition: A | Discharge status: Home / Self Care | Payer: Medicare Other | Source: Ambulatory Visit | Attending: Sports Medicine | Admitting: Sports Medicine

## 2020-07-14 ENCOUNTER — Other Ambulatory Visit: Payer: Self-pay

## 2020-07-14 ENCOUNTER — Encounter (HOSPITAL_COMMUNITY): Payer: Self-pay

## 2020-07-14 ENCOUNTER — Encounter (HOSPITAL_COMMUNITY): Admission: RE | Disposition: A | Discharge status: Home / Self Care | Payer: Self-pay | Source: Home / Self Care

## 2020-07-14 ENCOUNTER — Ambulatory Visit (HOSPITAL_COMMUNITY)
Admission: RE | Admit: 2020-07-14 | Discharge: 2020-07-14 | Disposition: A | Discharge status: Home / Self Care | Payer: Medicare Other | Source: Ambulatory Visit | Attending: Specialist | Admitting: Specialist

## 2020-07-14 ENCOUNTER — Ambulatory Visit (HOSPITAL_COMMUNITY)
Admission: RE | Admit: 2020-07-14 | Discharge: 2020-07-14 | Disposition: A | Discharge status: Home / Self Care | Payer: Medicare Other | Attending: Diagnostic Radiology | Admitting: Diagnostic Radiology

## 2020-07-14 DIAGNOSIS — Z8249 Family history of ischemic heart disease and other diseases of the circulatory system: Secondary | ICD-10-CM | POA: Insufficient documentation

## 2020-07-14 DIAGNOSIS — Z7951 Long term (current) use of inhaled steroids: Secondary | ICD-10-CM | POA: Diagnosis not present

## 2020-07-14 DIAGNOSIS — J4540 Moderate persistent asthma, uncomplicated: Secondary | ICD-10-CM | POA: Diagnosis not present

## 2020-07-14 DIAGNOSIS — J45909 Unspecified asthma, uncomplicated: Secondary | ICD-10-CM | POA: Insufficient documentation

## 2020-07-14 DIAGNOSIS — Z79899 Other long term (current) drug therapy: Secondary | ICD-10-CM | POA: Diagnosis not present

## 2020-07-14 DIAGNOSIS — M25511 Pain in right shoulder: Secondary | ICD-10-CM | POA: Insufficient documentation

## 2020-07-14 DIAGNOSIS — M19011 Primary osteoarthritis, right shoulder: Secondary | ICD-10-CM | POA: Insufficient documentation

## 2020-07-14 DIAGNOSIS — M7121 Synovial cyst of popliteal space [Baker], right knee: Secondary | ICD-10-CM

## 2020-07-14 DIAGNOSIS — E559 Vitamin D deficiency, unspecified: Secondary | ICD-10-CM | POA: Diagnosis not present

## 2020-07-14 DIAGNOSIS — W010XXA Fall on same level from slipping, tripping and stumbling without subsequent striking against object, initial encounter: Secondary | ICD-10-CM | POA: Diagnosis not present

## 2020-07-14 DIAGNOSIS — G4733 Obstructive sleep apnea (adult) (pediatric): Secondary | ICD-10-CM | POA: Diagnosis not present

## 2020-07-14 DIAGNOSIS — M25561 Pain in right knee: Secondary | ICD-10-CM | POA: Diagnosis not present

## 2020-07-14 DIAGNOSIS — Z833 Family history of diabetes mellitus: Secondary | ICD-10-CM | POA: Diagnosis not present

## 2020-07-14 DIAGNOSIS — Z9989 Dependence on other enabling machines and devices: Secondary | ICD-10-CM | POA: Diagnosis not present

## 2020-07-14 HISTORY — PX: RADIOLOGY WITH ANESTHESIA: SHX6223

## 2020-07-14 SURGERY — MRI WITH ANESTHESIA
Anesthesia: General | Laterality: Right

## 2020-07-14 MED ORDER — PHENYLEPHRINE HCL-NACL 10-0.9 MG/250ML-% IV SOLN
INTRAVENOUS | Status: DC | PRN
  Administered 2020-07-14: 30 ug/min via INTRAVENOUS

## 2020-07-14 MED ORDER — PROPOFOL 10 MG/ML IV BOLUS
INTRAVENOUS | Status: DC | PRN
  Administered 2020-07-14: 200 mg via INTRAVENOUS
  Administered 2020-07-14: 50 mg via INTRAVENOUS

## 2020-07-14 MED ORDER — MIDAZOLAM HCL 5 MG/5ML IJ SOLN
INTRAMUSCULAR | Status: DC | PRN
  Administered 2020-07-14: 2 mg via INTRAVENOUS

## 2020-07-14 MED ORDER — LIDOCAINE 2% (20 MG/ML) 5 ML SYRINGE
INTRAMUSCULAR | Status: DC | PRN
  Administered 2020-07-14: 100 mg via INTRAVENOUS

## 2020-07-14 MED ORDER — DEXAMETHASONE SODIUM PHOSPHATE 10 MG/ML IJ SOLN
INTRAMUSCULAR | Status: DC | PRN
  Administered 2020-07-14: 10 mg via INTRAVENOUS

## 2020-07-14 MED ORDER — CHLORHEXIDINE GLUCONATE 0.12 % MT SOLN: 15.0000 mL | Freq: Once | OROMUCOSAL | Status: AC

## 2020-07-14 MED ORDER — ONDANSETRON HCL 4 MG/2ML IJ SOLN
INTRAMUSCULAR | Status: DC | PRN
  Administered 2020-07-14: 4 mg via INTRAVENOUS

## 2020-07-14 MED ORDER — LACTATED RINGERS IV SOLN: INTRAVENOUS | Status: DC

## 2020-07-14 MED ORDER — ORAL CARE MOUTH RINSE: 15.0000 mL | Freq: Once | OROMUCOSAL | Status: AC

## 2020-07-14 MED ORDER — CHLORHEXIDINE GLUCONATE 0.12 % MT SOLN
OROMUCOSAL | Status: AC
  Administered 2020-07-14: 15 mL via OROMUCOSAL
  Filled 2020-07-14: qty 15

## 2020-07-14 NOTE — Anesthesia Postprocedure Evaluation (Signed)
Anesthesia Post Note  Patient: SHULEM MADER  Procedure(s) Performed: MRI RIGHT KNEE WITHOUT CONTRAST  WITH ANESTHESIA, RIGHT SHOULDER WITHOUT CONTRAST (Right )     Patient location during evaluation: PACU Anesthesia Type: General Level of consciousness: awake Pain management: pain level controlled Vital Signs Assessment: post-procedure vital signs reviewed and stable Respiratory status: spontaneous breathing Cardiovascular status: stable Postop Assessment: no apparent nausea or vomiting Anesthetic complications: no   No complications documented.  Last Vitals:  Vitals:   07/14/20 1028 07/14/20 1042  BP: 124/82 120/77  Pulse: 76 69  Resp: 19 16  Temp: (!) 36.1 C   SpO2: 95% 99%    Last Pain:  Vitals:   07/14/20 1042  TempSrc:   PainSc: 0-No pain                 Diontae Route

## 2020-07-14 NOTE — Anesthesia Preprocedure Evaluation (Signed)
Anesthesia Evaluation  Patient identified by MRN, date of birth, ID band Patient awake    Reviewed: Allergy & Precautions, NPO status , Patient's Chart, lab work & pertinent test results  Airway Mallampati: II  TM Distance: >3 FB     Dental   Pulmonary shortness of breath, asthma , sleep apnea ,    breath sounds clear to auscultation       Cardiovascular + dysrhythmias  Rhythm:Regular Rate:Normal     Neuro/Psych  Headaches, PSYCHIATRIC DISORDERS Anxiety Depression  Neuromuscular disease    GI/Hepatic Neg liver ROS, GERD  ,  Endo/Other  negative endocrine ROS  Renal/GU negative Renal ROS     Musculoskeletal   Abdominal   Peds  Hematology   Anesthesia Other Findings   Reproductive/Obstetrics                             Anesthesia Physical Anesthesia Plan  ASA: III  Anesthesia Plan: General   Post-op Pain Management:    Induction: Intravenous  PONV Risk Score and Plan: Ondansetron, Dexamethasone and Propofol infusion  Airway Management Planned: LMA  Additional Equipment:   Intra-op Plan:   Post-operative Plan: Extubation in OR  Informed Consent: I have reviewed the patients History and Physical, chart, labs and discussed the procedure including the risks, benefits and alternatives for the proposed anesthesia with the patient or authorized representative who has indicated his/her understanding and acceptance.     Dental advisory given  Plan Discussed with: CRNA and Anesthesiologist  Anesthesia Plan Comments:         Anesthesia Quick Evaluation

## 2020-07-14 NOTE — Transfer of Care (Signed)
Immediate Anesthesia Transfer of Care Note  Patient: [name]  Procedure(s) Performed: MRI RIGHT KNEE WITHOUT CONTRAST  WITH ANESTHESIA, RIGHT SHOULDER WITHOUT CONTRAST (Right )  Patient Location: PACU  Anesthesia Type:General  Level of Consciousness: awake, alert  and oriented  Airway & Oxygen Therapy: Patient Spontanous Breathing  Post-op Assessment: Report given to RN and Post -op Vital signs reviewed and stable  Post vital signs: Reviewed and stable  Last Vitals:  Vitals Value Taken Time  BP 124/82 07/14/20 1028  Temp 36.1 C 07/14/20 1028  Pulse 72 07/14/20 1030  Resp 13 07/14/20 1030  SpO2 93 % 07/14/20 1030  Vitals shown include unvalidated device data.  Last Pain:  Vitals:   07/14/20 1028  TempSrc:   PainSc: Asleep      Patients Stated Pain Goal: 4 (81/77/11 6579)  Complications: No complications documented.

## 2020-07-14 NOTE — Anesthesia Procedure Notes (Signed)
Procedure Name: LMA Insertion Date/Time: 07/14/2020 8:30 AM Performed by: Griffin Dakin, CRNA Pre-anesthesia Checklist: Patient identified, Emergency Drugs available, Suction available and Patient being monitored Patient Re-evaluated:Patient Re-evaluated prior to induction Oxygen Delivery Method: Circle system utilized Preoxygenation: Pre-oxygenation with 100% oxygen Induction Type: IV induction Ventilation: Mask ventilation without difficulty and Oral airway inserted - appropriate to patient size LMA: LMA inserted LMA Size: 5.0 Number of attempts: 1 Placement Confirmation: positive ETCO2 and breath sounds checked- equal and bilateral Tube secured with: Tape Dental Injury: Teeth and Oropharynx as per pre-operative assessment

## 2020-07-15 ENCOUNTER — Encounter (HOSPITAL_COMMUNITY): Payer: Self-pay | Admitting: Radiology

## 2020-07-15 DIAGNOSIS — M25511 Pain in right shoulder: Secondary | ICD-10-CM | POA: Diagnosis not present

## 2020-07-15 DIAGNOSIS — M25561 Pain in right knee: Secondary | ICD-10-CM | POA: Diagnosis not present

## 2020-07-27 DIAGNOSIS — M9905 Segmental and somatic dysfunction of pelvic region: Secondary | ICD-10-CM | POA: Diagnosis not present

## 2020-07-27 DIAGNOSIS — M9903 Segmental and somatic dysfunction of lumbar region: Secondary | ICD-10-CM | POA: Diagnosis not present

## 2020-07-27 DIAGNOSIS — M4306 Spondylolysis, lumbar region: Secondary | ICD-10-CM | POA: Diagnosis not present

## 2020-07-27 DIAGNOSIS — M5432 Sciatica, left side: Secondary | ICD-10-CM | POA: Diagnosis not present

## 2020-08-03 ENCOUNTER — Ambulatory Visit: Payer: Medicare Other | Admitting: Gastroenterology

## 2020-08-17 DIAGNOSIS — M9905 Segmental and somatic dysfunction of pelvic region: Secondary | ICD-10-CM | POA: Diagnosis not present

## 2020-08-17 DIAGNOSIS — M5432 Sciatica, left side: Secondary | ICD-10-CM | POA: Diagnosis not present

## 2020-08-17 DIAGNOSIS — M9903 Segmental and somatic dysfunction of lumbar region: Secondary | ICD-10-CM | POA: Diagnosis not present

## 2020-08-17 DIAGNOSIS — M4306 Spondylolysis, lumbar region: Secondary | ICD-10-CM | POA: Diagnosis not present

## 2020-08-24 ENCOUNTER — Telehealth: Payer: Self-pay | Admitting: Gastroenterology

## 2020-08-24 DIAGNOSIS — M5432 Sciatica, left side: Secondary | ICD-10-CM | POA: Diagnosis not present

## 2020-08-24 DIAGNOSIS — M4306 Spondylolysis, lumbar region: Secondary | ICD-10-CM | POA: Diagnosis not present

## 2020-08-24 DIAGNOSIS — M9903 Segmental and somatic dysfunction of lumbar region: Secondary | ICD-10-CM | POA: Diagnosis not present

## 2020-08-24 DIAGNOSIS — M9905 Segmental and somatic dysfunction of pelvic region: Secondary | ICD-10-CM | POA: Diagnosis not present

## 2020-08-24 NOTE — Telephone Encounter (Signed)
Patient calling to inform he is experiencing abd pain. Pt wants to know how to treat sxs. Pt states he will be going to [place] wants to discuss  his abd sxs.  Plz advise  thanks

## 2020-08-24 NOTE — Telephone Encounter (Signed)
Spoke with patient to schedule Colonoscopy appointment. Patient scheduled for Previsit 11/16/20 at 10:30AM..   Colonoscopy scheduled with Dr. Ardis Hughs for 11/30/20 at 11:00AM..  Thanks

## 2020-08-24 NOTE — Telephone Encounter (Signed)
The pt was last seen on 07/04/20 by Dr Ardis Hughs.  He had an [place] and was recommended to have colonoscopy.  [name] can you please call the pt set  him up for colonoscopy with Dr Ardis Hughs and previsit. He does not need an appt so the appt with Janett Billow can be cancelled.

## 2020-09-09 DIAGNOSIS — M7121 Synovial cyst of popliteal space [Baker], right knee: Secondary | ICD-10-CM | POA: Diagnosis not present

## 2020-09-09 DIAGNOSIS — M25561 Pain in right knee: Secondary | ICD-10-CM | POA: Diagnosis not present

## 2020-09-20 DIAGNOSIS — M9905 Segmental and somatic dysfunction of pelvic region: Secondary | ICD-10-CM | POA: Diagnosis not present

## 2020-09-20 DIAGNOSIS — M9903 Segmental and somatic dysfunction of lumbar region: Secondary | ICD-10-CM | POA: Diagnosis not present

## 2020-09-20 DIAGNOSIS — M5432 Sciatica, left side: Secondary | ICD-10-CM | POA: Diagnosis not present

## 2020-09-20 DIAGNOSIS — M4306 Spondylolysis, lumbar region: Secondary | ICD-10-CM | POA: Diagnosis not present

## 2020-09-27 ENCOUNTER — Ambulatory Visit: Payer: Medicare Other | Admitting: Gastroenterology

## 2020-10-04 DIAGNOSIS — M9903 Segmental and somatic dysfunction of lumbar region: Secondary | ICD-10-CM | POA: Diagnosis not present

## 2020-10-04 DIAGNOSIS — M4306 Spondylolysis, lumbar region: Secondary | ICD-10-CM | POA: Diagnosis not present

## 2020-10-04 DIAGNOSIS — M9905 Segmental and somatic dysfunction of pelvic region: Secondary | ICD-10-CM | POA: Diagnosis not present

## 2020-10-04 DIAGNOSIS — M5432 Sciatica, left side: Secondary | ICD-10-CM | POA: Diagnosis not present

## 2020-10-18 DIAGNOSIS — M5432 Sciatica, left side: Secondary | ICD-10-CM | POA: Diagnosis not present

## 2020-10-18 DIAGNOSIS — M9903 Segmental and somatic dysfunction of lumbar region: Secondary | ICD-10-CM | POA: Diagnosis not present

## 2020-10-18 DIAGNOSIS — M4306 Spondylolysis, lumbar region: Secondary | ICD-10-CM | POA: Diagnosis not present

## 2020-10-18 DIAGNOSIS — M9905 Segmental and somatic dysfunction of pelvic region: Secondary | ICD-10-CM | POA: Diagnosis not present

## 2020-10-31 ENCOUNTER — Other Ambulatory Visit: Payer: Self-pay

## 2020-10-31 ENCOUNTER — Telehealth: Payer: Self-pay | Admitting: Acute Care

## 2020-10-31 ENCOUNTER — Ambulatory Visit (INDEPENDENT_AMBULATORY_CARE_PROVIDER_SITE_OTHER): Payer: Medicare Other

## 2020-10-31 ENCOUNTER — Ambulatory Visit (INDEPENDENT_AMBULATORY_CARE_PROVIDER_SITE_OTHER): Payer: Medicare Other | Admitting: Acute Care

## 2020-10-31 ENCOUNTER — Encounter: Payer: Self-pay | Admitting: Acute Care

## 2020-10-31 VITALS — BP 120/72 | HR 74 | Temp 97.3°F | Ht 69.0 in | Wt 289.4 lb

## 2020-10-31 DIAGNOSIS — R0600 Dyspnea, unspecified: Secondary | ICD-10-CM

## 2020-10-31 DIAGNOSIS — J45909 Unspecified asthma, uncomplicated: Secondary | ICD-10-CM

## 2020-10-31 DIAGNOSIS — R0602 Shortness of breath: Secondary | ICD-10-CM | POA: Diagnosis not present

## 2020-10-31 MED ORDER — OMEPRAZOLE 20 MG PO CPDR: 20.0000 mg | DELAYED_RELEASE_CAPSULE | Freq: Every day | ORAL | 0 refills | Status: DC

## 2020-10-31 MED ORDER — PREDNISONE 10 MG PO TABS: ORAL_TABLET | ORAL | 0 refills | Status: DC

## 2020-10-31 NOTE — Telephone Encounter (Signed)
Called and spoke to patient, who stated that he was unable to find covid test to take. He is questioning if he should quarantine until he receives his CXR results.  Sarah, please advise. Thanks

## 2020-10-31 NOTE — Telephone Encounter (Signed)
Lm for patient.  

## 2020-10-31 NOTE — Patient Instructions (Addendum)
It is good to see you today. We will do a CXR today Prednisone taper; 10 mg tablets: 4 tabs x 2 days, 3 tabs x 2 days, 2 tabs x 2 days 1 tab x 2 days then stop.  Continue Symbicort 2 puffs twice daily . Rinse mouth after use Continue albuterol as needed for shortness of breath or wheezing Start Omeprazole 20 mg once daily x 2 weeks to see if reflux is any better.  Sleep with a few pillows under your head at bedtime.  Please do a Covid Test when you get home.  Call the office if you are positive .  Follow up in 2 weeks with Judson Roch NP or Dr. Halford Chessman.  Call if you need [place] sooner.  Please contact office for sooner follow up if symptoms do not improve or worsen or seek emergency care

## 2020-10-31 NOTE — Telephone Encounter (Signed)
Called [facility]  rd and was advised that they do have covid test.  Lm for patient.
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
## 2020-10-31 NOTE — Progress Notes (Signed)
Reviewed and agree with assessment/plan.   [name], MD [facility] Pulmonary/Critical Care 10/31/2020, 2:23 PM Pager:  [phone]

## 2020-10-31 NOTE — Telephone Encounter (Signed)
Patient is returning phone call. Patient phone number is [phone].

## 2020-10-31 NOTE — Telephone Encounter (Signed)
Lm x2 for patient.   

## 2020-11-01 ENCOUNTER — Other Ambulatory Visit: Payer: Self-pay | Admitting: Acute Care

## 2020-11-01 DIAGNOSIS — R0600 Dyspnea, unspecified: Secondary | ICD-10-CM

## 2020-11-01 MED ORDER — BUDESONIDE-FORMOTEROL FUMARATE 160-4.5 MCG/ACT IN AERO: 2.0000 | INHALATION_SPRAY | Freq: Two times a day (BID) | RESPIRATORY_TRACT | 11 refills | Status: DC

## 2020-11-01 NOTE — Telephone Encounter (Signed)
Patient is aware of below message and voiced her understanding.  Patient stated that he tested last night and it was negative.  Rx for symbicort has been sent to preferred pharmacy as requested by patient.  Nothing further needed at this time.  Will send to sarah as an [place].

## 2020-11-12 NOTE — Progress Notes (Signed)
Call patient and let him know his CXR was normal. Let him know I will see him 9/12 for follow up. Thanks so much

## 2020-11-16 ENCOUNTER — Ambulatory Visit (AMBULATORY_SURGERY_CENTER): Payer: Self-pay | Admitting: *Deleted

## 2020-11-16 ENCOUNTER — Other Ambulatory Visit: Payer: Self-pay

## 2020-11-16 VITALS — Ht 69.0 in | Wt 285.0 lb

## 2020-11-16 DIAGNOSIS — M5432 Sciatica, left side: Secondary | ICD-10-CM | POA: Diagnosis not present

## 2020-11-16 DIAGNOSIS — M9903 Segmental and somatic dysfunction of lumbar region: Secondary | ICD-10-CM | POA: Diagnosis not present

## 2020-11-16 DIAGNOSIS — R1032 Left lower quadrant pain: Secondary | ICD-10-CM

## 2020-11-16 DIAGNOSIS — M4306 Spondylolysis, lumbar region: Secondary | ICD-10-CM | POA: Diagnosis not present

## 2020-11-16 DIAGNOSIS — Z800 Family history of malignant neoplasm of digestive organs: Secondary | ICD-10-CM

## 2020-11-16 DIAGNOSIS — M9905 Segmental and somatic dysfunction of pelvic region: Secondary | ICD-10-CM | POA: Diagnosis not present

## 2020-11-16 MED ORDER — PEG 3350-KCL-NA BICARB-NACL 420 G PO SOLR: 4000.0000 mL | Freq: Once | ORAL | 0 refills | Status: AC

## 2020-11-16 NOTE — Progress Notes (Signed)
No egg or soy allergy known to patient  No issues with past sedation with any surgeries or procedures Patient denies ever being told they had issues or difficulty with intubation  No FH of Malignant Hyperthermia No diet pills per patient No home 02 use per patient  No blood thinners per patient  Pt denies issues with constipation  No A fib or A flutter  EMMI video to pt or via [place] 19 guidelines implemented in [place] today with Pt and RN   Pt is fully vaccinated  for Covid     Due to the COVID-19 pandemic we are asking patients to follow certain guidelines.  Pt aware of COVID protocols and [facility] guidelines   Pt verified name, DOB, address and insurance during PV today.  Pt mailed instruction packet of Emmi video, copy of consent form to read and not return, and instructions.  PV completed over the phone.  Pt encouraged to call with questions or issues.  My Chart instructions to pt as well

## 2020-11-21 ENCOUNTER — Telehealth: Payer: Self-pay | Admitting: Acute Care

## 2020-11-21 ENCOUNTER — Ambulatory Visit: Payer: Medicare Other | Admitting: Acute Care

## 2020-11-21 DIAGNOSIS — M7121 Synovial cyst of popliteal space [Baker], right knee: Secondary | ICD-10-CM | POA: Diagnosis not present

## 2020-11-21 NOTE — Telephone Encounter (Signed)
Called and spoke with pt stating to him to keep his appt that had been rescheduled for 9/19 and he verbalized understanding.  Stated to him if something happened and  he needed to have that appt be changed to a televisit to call us to let us know and we could discuss with provider. Nothing further needed.

## 2020-11-22 DIAGNOSIS — M25561 Pain in right knee: Secondary | ICD-10-CM | POA: Diagnosis not present

## 2020-11-28 ENCOUNTER — Telehealth: Payer: Self-pay

## 2020-11-28 ENCOUNTER — Encounter: Payer: Self-pay | Admitting: Acute Care

## 2020-11-28 ENCOUNTER — Ambulatory Visit (INDEPENDENT_AMBULATORY_CARE_PROVIDER_SITE_OTHER): Payer: Medicare Other | Admitting: Acute Care

## 2020-11-28 ENCOUNTER — Other Ambulatory Visit: Payer: Self-pay

## 2020-11-28 DIAGNOSIS — Z9989 Dependence on other enabling machines and devices: Secondary | ICD-10-CM

## 2020-11-28 DIAGNOSIS — J45909 Unspecified asthma, uncomplicated: Secondary | ICD-10-CM | POA: Diagnosis not present

## 2020-11-28 DIAGNOSIS — M9903 Segmental and somatic dysfunction of lumbar region: Secondary | ICD-10-CM | POA: Diagnosis not present

## 2020-11-28 DIAGNOSIS — M4306 Spondylolysis, lumbar region: Secondary | ICD-10-CM | POA: Diagnosis not present

## 2020-11-28 DIAGNOSIS — M5432 Sciatica, left side: Secondary | ICD-10-CM | POA: Diagnosis not present

## 2020-11-28 DIAGNOSIS — G4733 Obstructive sleep apnea (adult) (pediatric): Secondary | ICD-10-CM

## 2020-11-28 DIAGNOSIS — M9905 Segmental and somatic dysfunction of pelvic region: Secondary | ICD-10-CM | POA: Diagnosis not present

## 2020-11-28 NOTE — Patient Instructions (Addendum)
It is good to see you today. I am glad you are feeling better.  We will see if we can get you in to see Dr. Sabra Heck sooner for your tingling and coolness  in fingers and feet. Continue to use Symbicort inhaler 2 puffs twice daily. Rinse mouth after use.  Use rescue inhaler as needed  We will call [employer]/ to see if we can get a down Load for your CPAP machine If not we will request that they provide you with a new Sim card for your device We will refer to [facility] for weight loss Follow up in 3 months with Dr. Halford Chessman or Judson Roch NP Please contact office for sooner follow up if symptoms do not improve or worsen or seek emergency care

## 2020-11-28 NOTE — Telephone Encounter (Signed)
-----   Message from Milus Banister, MD sent at 11/24/2020  8:37 AM EDT ----- Jenny Reichmann, Again, thanks for your attention  Patty, Please contact the patient, explain the above. He needs to be rescheduled at [facility], my next available  thanks ----- Message ----- From: Osvaldo Angst, CRNA Sent: 11/24/2020   8:05 AM EDT To: Milus Banister, MD  Dr. Ardis Hughs,  This pt scheduled with you on 11/30/20.  During his last surgery he was intubated with a video laryngoscopy so is a difficult airway and his scheduled procedure will need to be done at hospital.  Thanks,  Osvaldo Angst CRNA

## 2020-11-28 NOTE — Telephone Encounter (Signed)
Called pt and informed about anesthesia's request below to cancel procedure on 11/30/20 and reschedule to [facility]. Pt aware he will receive a call from Koren Shiver, RN to reschedule procedure to [facility]. Verbalized acceptance and understanding.
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
## 2020-11-29 ENCOUNTER — Ambulatory Visit (INDEPENDENT_AMBULATORY_CARE_PROVIDER_SITE_OTHER): Payer: Medicare Other | Admitting: Podiatry

## 2020-11-29 DIAGNOSIS — R200 Anesthesia of skin: Secondary | ICD-10-CM | POA: Diagnosis not present

## 2020-11-29 DIAGNOSIS — R202 Paresthesia of skin: Secondary | ICD-10-CM | POA: Diagnosis not present

## 2020-11-29 MED ORDER — GABAPENTIN 100 MG PO CAPS: 100.0000 mg | ORAL_CAPSULE | Freq: Three times a day (TID) | ORAL | 3 refills | Status: DC

## 2020-11-29 NOTE — Progress Notes (Signed)
Reviewed and agree with assessment/plan.   [name], MD [facility] Pulmonary/Critical Care 11/29/2020, 8:40 AM Pager:  [phone]

## 2020-11-30 ENCOUNTER — Other Ambulatory Visit: Payer: Self-pay

## 2020-11-30 ENCOUNTER — Encounter: Payer: Medicare Other | Admitting: Gastroenterology

## 2020-11-30 DIAGNOSIS — R110 Nausea: Secondary | ICD-10-CM

## 2020-11-30 DIAGNOSIS — Z800 Family history of malignant neoplasm of digestive organs: Secondary | ICD-10-CM

## 2020-11-30 DIAGNOSIS — B353 Tinea pedis: Secondary | ICD-10-CM | POA: Diagnosis not present

## 2020-11-30 DIAGNOSIS — E559 Vitamin D deficiency, unspecified: Secondary | ICD-10-CM | POA: Diagnosis not present

## 2020-11-30 DIAGNOSIS — R197 Diarrhea, unspecified: Secondary | ICD-10-CM

## 2020-11-30 DIAGNOSIS — R7309 Other abnormal glucose: Secondary | ICD-10-CM | POA: Diagnosis not present

## 2020-11-30 DIAGNOSIS — R7303 Prediabetes: Secondary | ICD-10-CM | POA: Diagnosis not present

## 2020-11-30 DIAGNOSIS — R1032 Left lower quadrant pain: Secondary | ICD-10-CM

## 2020-11-30 DIAGNOSIS — R202 Paresthesia of skin: Secondary | ICD-10-CM | POA: Diagnosis not present

## 2020-11-30 DIAGNOSIS — Z6841 Body Mass Index (BMI) 40.0 and over, adult: Secondary | ICD-10-CM | POA: Diagnosis not present

## 2020-11-30 MED ORDER — PEG 3350-KCL-NA BICARB-NACL 420 G PO SOLR: 4000.0000 mL | Freq: Once | ORAL | 0 refills | Status: AC
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
## 2020-11-30 NOTE — Telephone Encounter (Signed)
I spoke with the pt and we discussed the date and time as well as location of the procedure.  He has been advised that all information has been mailed to the pt home.  He states that he does have prep at home and does not have any further questions.

## 2020-11-30 NOTE — Telephone Encounter (Signed)
Colon scheduled for 12/22/20 at 945 am with DJ at [facility]

## 2020-11-30 NOTE — Telephone Encounter (Signed)
Left message on machine to call back  

## 2020-12-01 ENCOUNTER — Ambulatory Visit: Payer: 59 | Admitting: Podiatry

## 2020-12-13 ENCOUNTER — Encounter (HOSPITAL_COMMUNITY): Payer: Self-pay | Admitting: Gastroenterology

## 2020-12-13 NOTE — Progress Notes (Signed)
Attempted to obtain medical history via telephone, unable to reach at this time. I left a voicemail to return pre surgical testing department's phone call.  

## 2020-12-20 ENCOUNTER — Telehealth: Payer: Self-pay | Admitting: Gastroenterology

## 2020-12-20 DIAGNOSIS — E785 Hyperlipidemia, unspecified: Secondary | ICD-10-CM | POA: Diagnosis not present

## 2020-12-20 DIAGNOSIS — Z125 Encounter for screening for malignant neoplasm of prostate: Secondary | ICD-10-CM | POA: Diagnosis not present

## 2020-12-20 DIAGNOSIS — J4540 Moderate persistent asthma, uncomplicated: Secondary | ICD-10-CM | POA: Diagnosis not present

## 2020-12-20 DIAGNOSIS — F5101 Primary insomnia: Secondary | ICD-10-CM | POA: Diagnosis not present

## 2020-12-20 DIAGNOSIS — Z0000 Encounter for general adult medical examination without abnormal findings: Secondary | ICD-10-CM | POA: Diagnosis not present

## 2020-12-20 DIAGNOSIS — G4733 Obstructive sleep apnea (adult) (pediatric): Secondary | ICD-10-CM | POA: Diagnosis not present

## 2020-12-22 ENCOUNTER — Ambulatory Visit (HOSPITAL_COMMUNITY): Payer: Medicare Other | Admitting: Anesthesiology

## 2020-12-22 ENCOUNTER — Telehealth: Payer: Self-pay

## 2020-12-22 ENCOUNTER — Encounter (HOSPITAL_COMMUNITY)
Admission: RE | Disposition: A | Discharge status: Home / Self Care | Payer: Self-pay | Source: Home / Self Care | Attending: Gastroenterology

## 2020-12-22 ENCOUNTER — Ambulatory Visit (HOSPITAL_COMMUNITY)
Admission: RE | Admit: 2020-12-22 | Discharge: 2020-12-22 | Disposition: A | Discharge status: Home / Self Care | Payer: Medicare Other | Attending: Gastroenterology | Admitting: Gastroenterology

## 2020-12-22 ENCOUNTER — Other Ambulatory Visit: Payer: Self-pay

## 2020-12-22 ENCOUNTER — Encounter (HOSPITAL_COMMUNITY): Payer: Self-pay | Admitting: Gastroenterology

## 2020-12-22 DIAGNOSIS — G4733 Obstructive sleep apnea (adult) (pediatric): Secondary | ICD-10-CM | POA: Diagnosis not present

## 2020-12-22 DIAGNOSIS — R1032 Left lower quadrant pain: Secondary | ICD-10-CM | POA: Insufficient documentation

## 2020-12-22 DIAGNOSIS — R110 Nausea: Secondary | ICD-10-CM

## 2020-12-22 DIAGNOSIS — Z9989 Dependence on other enabling machines and devices: Secondary | ICD-10-CM | POA: Diagnosis not present

## 2020-12-22 DIAGNOSIS — Z800 Family history of malignant neoplasm of digestive organs: Secondary | ICD-10-CM | POA: Insufficient documentation

## 2020-12-22 DIAGNOSIS — R1012 Left upper quadrant pain: Secondary | ICD-10-CM | POA: Insufficient documentation

## 2020-12-22 DIAGNOSIS — R197 Diarrhea, unspecified: Secondary | ICD-10-CM

## 2020-12-22 HISTORY — PX: COLONOSCOPY WITH PROPOFOL: SHX5780

## 2020-12-22 SURGERY — COLONOSCOPY WITH PROPOFOL
Anesthesia: Monitor Anesthesia Care

## 2020-12-22 MED ORDER — LACTATED RINGERS IV SOLN
INTRAVENOUS | Status: DC | PRN
Start: 2020-12-22 — End: 2020-12-22

## 2020-12-22 MED ORDER — SODIUM CHLORIDE 0.9 % IV SOLN
INTRAVENOUS | Status: DC
Start: 2020-12-22 — End: 2020-12-22

## 2020-12-22 MED ORDER — PROPOFOL 500 MG/50ML IV EMUL
INTRAVENOUS | Status: DC | PRN
  Administered 2020-12-22: 125 ug/kg/min via INTRAVENOUS

## 2020-12-22 SURGICAL SUPPLY — 21 items

## 2020-12-22 NOTE — Transfer of Care (Signed)
Immediate Anesthesia Transfer of Care Note  Patient: [name]  Procedure(s) Performed: COLONOSCOPY WITH PROPOFOL  Patient Location: PACU  Anesthesia Type:MAC  Level of Consciousness: sedated, patient cooperative and responds to stimulation  Airway & Oxygen Therapy: Patient Spontanous Breathing and Patient connected to face mask oxygen  Post-op Assessment: Report given to RN and Post -op Vital signs reviewed and stable  Post vital signs: Reviewed and stable  Last Vitals:  Vitals Value Taken Time  BP    Temp    Pulse 91 12/22/20 1107  Resp    SpO2 100 % 12/22/20 1107  Vitals shown include unvalidated device data.  Last Pain:  Vitals:   12/22/20 0833  TempSrc: Oral  PainSc: 0-No pain         Complications: No notable events documented.

## 2020-12-22 NOTE — Discharge Instructions (Signed)

## 2020-12-22 NOTE — Op Note (Signed)
[facility] Patient Name: [name] Procedure Date: 12/22/2020 MRN: [id number] Attending MD: Milus Banister , MD Date of Birth: [birth year] CSN: [id number] Age: 55 Admit Type: Outpatient Procedure:                Colonoscopy Indications:              Abdominal pain in the left lower quadrant,                            Abdominal pain in the left upper quadrant; Father                            had colon cancer. Last examination 2017 Providers:                Milus Banister, MD, Kary Kos RN, RN, Ervin Knack, Benetta Spar, Technician Referring MD:              Medicines:                Monitored Anesthesia Care Complications:            No immediate complications. Estimated blood loss:                            None. Estimated Blood Loss:     Estimated blood loss: none. Procedure:                Pre-Anesthesia Assessment:                           - Prior to the procedure, a History and Physical                            was performed, and patient medications and                            allergies were reviewed. The patient's tolerance of                            previous anesthesia was also reviewed. The risks                            and benefits of the procedure and the sedation                            options and risks were discussed with the patient.                            All questions were answered, and informed consent                            was obtained. Prior Anticoagulants: The patient has  taken no previous anticoagulant or antiplatelet                            agents. ASA Grade Assessment: III - A patient with                            severe systemic disease. After reviewing the risks                            and benefits, the patient was deemed in                            satisfactory condition to undergo the procedure.                           After obtaining  informed consent, the colonoscope                            was passed under direct vision. Throughout the                            procedure, the patient's blood pressure, pulse, and                            oxygen saturations were monitored continuously. The                            [id number]-[id number] ([id number]) Olympus colonoscope was                            introduced through the anus and advanced to the the                            terminal ileum, with identification of the                            appendiceal orifice and IC valve. The colonoscopy                            was performed without difficulty. The patient                            tolerated the procedure well. The quality of the                            bowel preparation was good. The terminal ileum,                            ileocecal valve, appendiceal orifice, and rectum                            were photographed. Scope In: 10:50:44 AM Scope Out: 11:01:04 AM Scope Withdrawal Time: 0 hours 8 minutes 11 seconds  Total Procedure Duration: 0 hours 10  minutes 20 seconds  Findings:      The terminal ileum appeared normal.      The entire examined colon appeared normal on direct and retroflexion       views. Impression:               - The examined portion of the ileum was normal.                           - The entire examined colon is normal on direct and                            retroflexion views.                           - No polyps or cancers. Moderate Sedation:      Not Applicable - Patient had care per Anesthesia. Recommendation:           - Patient has a contact number available for                            emergencies. The signs and symptoms of potential                            delayed complications were discussed with the                            patient. Return to normal activities tomorrow.                            Written discharge instructions were provided to the                             patient.                           - Resume previous diet.                           - Continue present medications.                           - Repeat colonoscopy in 5 years for screening                            purposes.                           - Your left sided abdominal pains are not from your                            colon. If they worsen, please call to consider                            further testing. Procedure Code(s):        --- Professional ---  45378, Colonoscopy, flexible; diagnostic, including                            collection of specimen(s) by brushing or washing,                            when performed (separate procedure) Diagnosis Code(s):        --- Professional ---                           R10.32, Left lower quadrant pain                           R10.12, Left upper quadrant pain CPT copyright 2019 American Medical Association. All rights reserved. The codes documented in this report are preliminary and upon coder review may  be revised to meet current compliance requirements. Milus Banister, MD 12/22/2020 11:18:52 AM This report has been signed electronically. Number of Addenda: 0
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
## 2020-12-22 NOTE — Telephone Encounter (Signed)
-----   Message from Milus Banister, MD sent at 12/22/2020 11:34 AM EDT ----- He has left sided abd pains and needs a CT scan.  Very claustrophobic, even with oral valium. Can you contact radiology about a sedation assisted CT scan abd/pelvis with IV and oral contrast.  Thanks

## 2020-12-22 NOTE — Anesthesia Postprocedure Evaluation (Signed)
Anesthesia Post Note  Patient: [name]  Procedure(s) Performed: COLONOSCOPY WITH PROPOFOL     Patient location during evaluation: PACU Anesthesia Type: MAC Level of consciousness: awake and alert Pain management: pain level controlled Vital Signs Assessment: post-procedure vital signs reviewed and stable Respiratory status: spontaneous breathing, nonlabored ventilation and respiratory function stable Cardiovascular status: blood pressure returned to baseline and stable Postop Assessment: no apparent nausea or vomiting Anesthetic complications: no   No notable events documented.  Last Vitals:  Vitals:   12/22/20 1108 12/22/20 1110  BP: 140/86 136/87  Pulse: 91 85  Resp: (!) 26 (!) 24  Temp: 36.6 C   SpO2: 100% 100%    Last Pain:  Vitals:   12/22/20 1110  TempSrc:   PainSc: 0-No pain                 Pervis Hocking

## 2020-12-22 NOTE — Anesthesia Preprocedure Evaluation (Addendum)
Anesthesia Evaluation  Patient identified by MRN, date of birth, ID band Patient awake    Reviewed: Allergy & Precautions, NPO status , Patient's Chart, lab work & pertinent test results  History of Anesthesia Complications (+) PROLONGED EMERGENCE and history of anesthetic complications  Airway Mallampati: III  TM Distance: >3 FB Neck ROM: Full    Dental no notable dental hx. (+) Teeth Intact, Dental Advisory Given   Pulmonary asthma (well controlled, seasonal only) , sleep apnea and Continuous Positive Airway Pressure Ventilation ,    Pulmonary exam normal breath sounds clear to auscultation       Cardiovascular Normal cardiovascular exam Rhythm:Regular Rate:Normal  Echo 08/2019: 1. Left ventricular ejection fraction, by estimation, is 55 to 60%. The  left ventricle has normal function. The left ventricle has no regional  wall motion abnormalities. Left ventricular diastolic parameters are  indeterminate. Elevated left ventricular  end-diastolic pressure.  2. Right ventricular systolic function is normal. The right ventricular  size is normal. Tricuspid regurgitation signal is inadequate for assessing  PA pressure.  3. The mitral valve is normal in structure. No evidence of mitral valve  regurgitation. No evidence of mitral stenosis.  4. The aortic valve is normal in structure. Aortic valve regurgitation is  not visualized. No aortic stenosis is present.  5. Aortic dilatation noted. There is mild dilatation of the aortic root  measuring 37 mm.    Neuro/Psych  Headaches, PSYCHIATRIC DISORDERS Anxiety Depression    GI/Hepatic Neg liver ROS, GERD  Controlled,  Endo/Other  Morbid obesity  Renal/GU negative Renal ROS  negative genitourinary   Musculoskeletal  (+) Arthritis , Osteoarthritis,    Abdominal   Peds  Hematology negative hematology ROS (+)   Anesthesia Other Findings    Reproductive/Obstetrics negative OB ROS                           Anesthesia Physical Anesthesia Plan  ASA: 3  Anesthesia Plan: MAC   Post-op Pain Management:    Induction:   PONV Risk Score and Plan: 2 and Propofol infusion and TIVA  Airway Management Planned: Natural Airway and Simple Face Mask  Additional Equipment: None  Intra-op Plan:   Post-operative Plan:   Informed Consent: I have reviewed the patients History and Physical, chart, labs and discussed the procedure including the risks, benefits and alternatives for the proposed anesthesia with the patient or authorized representative who has indicated his/her understanding and acceptance.       Plan Discussed with: CRNA  Anesthesia Plan Comments:        Anesthesia Quick Evaluation

## 2020-12-23 ENCOUNTER — Encounter (HOSPITAL_COMMUNITY): Payer: Self-pay | Admitting: Gastroenterology

## 2020-12-23 NOTE — Telephone Encounter (Signed)
An order for Sedated CT has been entered and a message sent to Garth Bigness for guidance on setting up the appt. Will await a message from [facility] on how to proceed.

## 2020-12-26 NOTE — Telephone Encounter (Signed)
Dr Ardis Hughs I spoke with CT scheduling and was told that they do not sedate for CT scans.  She asked that the pt go and see the CT machine so that he is aware it is not closed in to see if it is possible for him to proceed without sedation.  I spoke with the pt and he is going to [facility] to look at the CT machine and will call back with his decision.

## 2020-12-26 NOTE — Telephone Encounter (Signed)
[name] Call me [phone]

## 2020-12-27 DIAGNOSIS — M9905 Segmental and somatic dysfunction of pelvic region: Secondary | ICD-10-CM | POA: Diagnosis not present

## 2020-12-27 DIAGNOSIS — M5432 Sciatica, left side: Secondary | ICD-10-CM | POA: Diagnosis not present

## 2020-12-27 DIAGNOSIS — M9903 Segmental and somatic dysfunction of lumbar region: Secondary | ICD-10-CM | POA: Diagnosis not present

## 2020-12-27 DIAGNOSIS — M4306 Spondylolysis, lumbar region: Secondary | ICD-10-CM | POA: Diagnosis not present

## 2021-01-23 DIAGNOSIS — M9905 Segmental and somatic dysfunction of pelvic region: Secondary | ICD-10-CM | POA: Diagnosis not present

## 2021-01-23 DIAGNOSIS — M5432 Sciatica, left side: Secondary | ICD-10-CM | POA: Diagnosis not present

## 2021-01-23 DIAGNOSIS — M9903 Segmental and somatic dysfunction of lumbar region: Secondary | ICD-10-CM | POA: Diagnosis not present

## 2021-01-23 DIAGNOSIS — M4306 Spondylolysis, lumbar region: Secondary | ICD-10-CM | POA: Diagnosis not present

## 2021-02-07 ENCOUNTER — Ambulatory Visit: Payer: Medicare Other | Attending: Family

## 2021-02-07 DIAGNOSIS — Z23 Encounter for immunization: Secondary | ICD-10-CM

## 2021-02-07 NOTE — Progress Notes (Signed)
   Covid-19 Vaccination Clinic  Name:  [name]    MRN: [id number] DOB: 1966-03-09  02/07/2021  [name] was observed post Covid-19 immunization for 15 minutes without incident. He was provided with Vaccine Information Sheet and instruction to access the V-Safe system.   [name] was instructed to call 911 with any severe reactions post vaccine: Difficulty breathing  Swelling of face and throat  A fast heartbeat  A bad rash all over body  Dizziness and weakness   Immunizations Administered     Name Date Dose VIS Date Route   Moderna Covid-19 vaccine Bivalent Booster 02/07/2021  9:00 AM 0.5 mL 10/22/2020 Intramuscular   Manufacturer: Levan Hurst   Lot: [id number]   [place]: [id number]

## 2021-02-28 DIAGNOSIS — M9905 Segmental and somatic dysfunction of pelvic region: Secondary | ICD-10-CM | POA: Diagnosis not present

## 2021-02-28 DIAGNOSIS — M4306 Spondylolysis, lumbar region: Secondary | ICD-10-CM | POA: Diagnosis not present

## 2021-02-28 DIAGNOSIS — M5432 Sciatica, left side: Secondary | ICD-10-CM | POA: Diagnosis not present

## 2021-02-28 DIAGNOSIS — M9903 Segmental and somatic dysfunction of lumbar region: Secondary | ICD-10-CM | POA: Diagnosis not present

## 2021-03-17 DIAGNOSIS — M660 Rupture of popliteal cyst: Secondary | ICD-10-CM | POA: Diagnosis not present

## 2021-03-28 DIAGNOSIS — M9903 Segmental and somatic dysfunction of lumbar region: Secondary | ICD-10-CM | POA: Diagnosis not present

## 2021-03-28 DIAGNOSIS — M9905 Segmental and somatic dysfunction of pelvic region: Secondary | ICD-10-CM | POA: Diagnosis not present

## 2021-03-28 DIAGNOSIS — M4306 Spondylolysis, lumbar region: Secondary | ICD-10-CM | POA: Diagnosis not present

## 2021-03-28 DIAGNOSIS — M5136 Other intervertebral disc degeneration, lumbar region: Secondary | ICD-10-CM | POA: Diagnosis not present

## 2021-03-30 DIAGNOSIS — M25512 Pain in left shoulder: Secondary | ICD-10-CM | POA: Diagnosis not present

## 2021-04-04 DIAGNOSIS — M25512 Pain in left shoulder: Secondary | ICD-10-CM | POA: Diagnosis not present

## 2021-04-11 ENCOUNTER — Ambulatory Visit (INDEPENDENT_AMBULATORY_CARE_PROVIDER_SITE_OTHER): Payer: Medicare Other | Admitting: Pulmonary Disease

## 2021-04-11 ENCOUNTER — Other Ambulatory Visit: Payer: Self-pay

## 2021-04-11 ENCOUNTER — Encounter: Payer: Self-pay | Admitting: Pulmonary Disease

## 2021-04-11 DIAGNOSIS — Z9989 Dependence on other enabling machines and devices: Secondary | ICD-10-CM

## 2021-04-11 DIAGNOSIS — Z7189 Other specified counseling: Secondary | ICD-10-CM | POA: Diagnosis not present

## 2021-04-11 DIAGNOSIS — J45909 Unspecified asthma, uncomplicated: Secondary | ICD-10-CM

## 2021-04-11 DIAGNOSIS — G4733 Obstructive sleep apnea (adult) (pediatric): Secondary | ICD-10-CM | POA: Diagnosis not present

## 2021-04-11 MED ORDER — BREZTRI AEROSPHERE 160-9-4.8 MCG/ACT IN AERO: 2.0000 | INHALATION_SPRAY | Freq: Two times a day (BID) | RESPIRATORY_TRACT | 0 refills | Status: DC

## 2021-04-11 NOTE — Patient Instructions (Signed)
Try sample of breztri two puffs twice per day and rinse your mouth after each use.  Call for refill if you think breztri work better than symbicort.  Otherwise switch back to symbicort once the [place] sample is done.  Follow up in 6 months
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
## 2021-04-18 DIAGNOSIS — M25512 Pain in left shoulder: Secondary | ICD-10-CM | POA: Diagnosis not present

## 2021-05-11 DIAGNOSIS — M4306 Spondylolysis, lumbar region: Secondary | ICD-10-CM | POA: Diagnosis not present

## 2021-05-11 DIAGNOSIS — M9903 Segmental and somatic dysfunction of lumbar region: Secondary | ICD-10-CM | POA: Diagnosis not present

## 2021-05-11 DIAGNOSIS — M9905 Segmental and somatic dysfunction of pelvic region: Secondary | ICD-10-CM | POA: Diagnosis not present

## 2021-05-11 DIAGNOSIS — M5136 Other intervertebral disc degeneration, lumbar region: Secondary | ICD-10-CM | POA: Diagnosis not present

## 2021-05-18 DIAGNOSIS — M9903 Segmental and somatic dysfunction of lumbar region: Secondary | ICD-10-CM | POA: Diagnosis not present

## 2021-05-18 DIAGNOSIS — M9905 Segmental and somatic dysfunction of pelvic region: Secondary | ICD-10-CM | POA: Diagnosis not present

## 2021-05-18 DIAGNOSIS — M4306 Spondylolysis, lumbar region: Secondary | ICD-10-CM | POA: Diagnosis not present

## 2021-05-18 DIAGNOSIS — M5136 Other intervertebral disc degeneration, lumbar region: Secondary | ICD-10-CM | POA: Diagnosis not present

## 2021-05-19 DIAGNOSIS — M5136 Other intervertebral disc degeneration, lumbar region: Secondary | ICD-10-CM | POA: Diagnosis not present

## 2021-05-19 DIAGNOSIS — M9905 Segmental and somatic dysfunction of pelvic region: Secondary | ICD-10-CM | POA: Diagnosis not present

## 2021-05-19 DIAGNOSIS — M4306 Spondylolysis, lumbar region: Secondary | ICD-10-CM | POA: Diagnosis not present

## 2021-05-19 DIAGNOSIS — M9903 Segmental and somatic dysfunction of lumbar region: Secondary | ICD-10-CM | POA: Diagnosis not present

## 2021-05-23 DIAGNOSIS — M9905 Segmental and somatic dysfunction of pelvic region: Secondary | ICD-10-CM | POA: Diagnosis not present

## 2021-05-23 DIAGNOSIS — M5136 Other intervertebral disc degeneration, lumbar region: Secondary | ICD-10-CM | POA: Diagnosis not present

## 2021-05-23 DIAGNOSIS — M9903 Segmental and somatic dysfunction of lumbar region: Secondary | ICD-10-CM | POA: Diagnosis not present

## 2021-05-23 DIAGNOSIS — M4306 Spondylolysis, lumbar region: Secondary | ICD-10-CM | POA: Diagnosis not present

## 2021-05-30 DIAGNOSIS — M9903 Segmental and somatic dysfunction of lumbar region: Secondary | ICD-10-CM | POA: Diagnosis not present

## 2021-05-30 DIAGNOSIS — M4306 Spondylolysis, lumbar region: Secondary | ICD-10-CM | POA: Diagnosis not present

## 2021-05-30 DIAGNOSIS — M5136 Other intervertebral disc degeneration, lumbar region: Secondary | ICD-10-CM | POA: Diagnosis not present

## 2021-05-30 DIAGNOSIS — M9905 Segmental and somatic dysfunction of pelvic region: Secondary | ICD-10-CM | POA: Diagnosis not present

## 2021-06-08 ENCOUNTER — Telehealth: Payer: Self-pay | Admitting: Pulmonary Disease

## 2021-06-08 DIAGNOSIS — M9903 Segmental and somatic dysfunction of lumbar region: Secondary | ICD-10-CM | POA: Diagnosis not present

## 2021-06-08 DIAGNOSIS — M5136 Other intervertebral disc degeneration, lumbar region: Secondary | ICD-10-CM | POA: Diagnosis not present

## 2021-06-08 DIAGNOSIS — M9905 Segmental and somatic dysfunction of pelvic region: Secondary | ICD-10-CM | POA: Diagnosis not present

## 2021-06-08 DIAGNOSIS — M4306 Spondylolysis, lumbar region: Secondary | ICD-10-CM | POA: Diagnosis not present

## 2021-06-08 NOTE — Telephone Encounter (Signed)
Called and spoke with patient. He stated that he needs a letter to state the medical necessity for his cpap machine. He has a flight planned for next Thursday and is allowed only 1 carryon bag. If he has a letter, the airline will not count the cpap bag as a carryon bag.  ? ?Per patient, he is ok with [place] describing his medical condition in the letter. Once the letter is ready for pickup, he will come by the office to get it.  ? ?Dr. Halford Chessman, can you please advise? Routing as a red message since the patient leaves in a week. Thanks!  ?

## 2021-06-09 ENCOUNTER — Encounter: Payer: Self-pay | Admitting: Pulmonary Disease

## 2021-06-09 DIAGNOSIS — M79672 Pain in left foot: Secondary | ICD-10-CM | POA: Diagnosis not present

## 2021-06-09 DIAGNOSIS — M722 Plantar fascial fibromatosis: Secondary | ICD-10-CM | POA: Diagnosis not present

## 2021-06-09 DIAGNOSIS — R7303 Prediabetes: Secondary | ICD-10-CM | POA: Diagnosis not present

## 2021-06-09 DIAGNOSIS — G629 Polyneuropathy, unspecified: Secondary | ICD-10-CM | POA: Diagnosis not present

## 2021-06-09 NOTE — Telephone Encounter (Signed)
Letter received. Faxed to [place] office and called and lvm for patient letting him know he could pick up letter from [facility] office.  ?

## 2021-06-09 NOTE — Telephone Encounter (Signed)
Letter printed and signed.  Given to [place] in [place] office.  Will need to coordinate how the patient will pick the letter up. ?

## 2021-06-09 NOTE — Telephone Encounter (Signed)
Letter has been stamped and placed up front for pickup.  ?

## 2021-06-13 ENCOUNTER — Ambulatory Visit: Payer: 59 | Admitting: Podiatry

## 2021-06-15 DIAGNOSIS — M5136 Other intervertebral disc degeneration, lumbar region: Secondary | ICD-10-CM | POA: Diagnosis not present

## 2021-06-15 DIAGNOSIS — M9905 Segmental and somatic dysfunction of pelvic region: Secondary | ICD-10-CM | POA: Diagnosis not present

## 2021-06-15 DIAGNOSIS — M9903 Segmental and somatic dysfunction of lumbar region: Secondary | ICD-10-CM | POA: Diagnosis not present

## 2021-06-15 DIAGNOSIS — M4306 Spondylolysis, lumbar region: Secondary | ICD-10-CM | POA: Diagnosis not present

## 2021-06-22 DIAGNOSIS — M9905 Segmental and somatic dysfunction of pelvic region: Secondary | ICD-10-CM | POA: Diagnosis not present

## 2021-06-22 DIAGNOSIS — M4306 Spondylolysis, lumbar region: Secondary | ICD-10-CM | POA: Diagnosis not present

## 2021-06-22 DIAGNOSIS — M9903 Segmental and somatic dysfunction of lumbar region: Secondary | ICD-10-CM | POA: Diagnosis not present

## 2021-06-22 DIAGNOSIS — M5136 Other intervertebral disc degeneration, lumbar region: Secondary | ICD-10-CM | POA: Diagnosis not present

## 2021-06-29 DIAGNOSIS — M5136 Other intervertebral disc degeneration, lumbar region: Secondary | ICD-10-CM | POA: Diagnosis not present

## 2021-06-29 DIAGNOSIS — M9903 Segmental and somatic dysfunction of lumbar region: Secondary | ICD-10-CM | POA: Diagnosis not present

## 2021-06-29 DIAGNOSIS — M4306 Spondylolysis, lumbar region: Secondary | ICD-10-CM | POA: Diagnosis not present

## 2021-06-29 DIAGNOSIS — M9905 Segmental and somatic dysfunction of pelvic region: Secondary | ICD-10-CM | POA: Diagnosis not present

## 2021-07-06 DIAGNOSIS — M9903 Segmental and somatic dysfunction of lumbar region: Secondary | ICD-10-CM | POA: Diagnosis not present

## 2021-07-06 DIAGNOSIS — M5136 Other intervertebral disc degeneration, lumbar region: Secondary | ICD-10-CM | POA: Diagnosis not present

## 2021-07-06 DIAGNOSIS — M4306 Spondylolysis, lumbar region: Secondary | ICD-10-CM | POA: Diagnosis not present

## 2021-07-06 DIAGNOSIS — M9905 Segmental and somatic dysfunction of pelvic region: Secondary | ICD-10-CM | POA: Diagnosis not present

## 2021-07-13 DIAGNOSIS — M9905 Segmental and somatic dysfunction of pelvic region: Secondary | ICD-10-CM | POA: Diagnosis not present

## 2021-07-13 DIAGNOSIS — M4306 Spondylolysis, lumbar region: Secondary | ICD-10-CM | POA: Diagnosis not present

## 2021-07-13 DIAGNOSIS — M5136 Other intervertebral disc degeneration, lumbar region: Secondary | ICD-10-CM | POA: Diagnosis not present

## 2021-07-13 DIAGNOSIS — M9903 Segmental and somatic dysfunction of lumbar region: Secondary | ICD-10-CM | POA: Diagnosis not present

## 2021-07-18 DIAGNOSIS — M4306 Spondylolysis, lumbar region: Secondary | ICD-10-CM | POA: Diagnosis not present

## 2021-07-18 DIAGNOSIS — M9903 Segmental and somatic dysfunction of lumbar region: Secondary | ICD-10-CM | POA: Diagnosis not present

## 2021-07-18 DIAGNOSIS — M5136 Other intervertebral disc degeneration, lumbar region: Secondary | ICD-10-CM | POA: Diagnosis not present

## 2021-07-18 DIAGNOSIS — M9905 Segmental and somatic dysfunction of pelvic region: Secondary | ICD-10-CM | POA: Diagnosis not present

## 2021-07-27 DIAGNOSIS — M4306 Spondylolysis, lumbar region: Secondary | ICD-10-CM | POA: Diagnosis not present

## 2021-07-27 DIAGNOSIS — M9903 Segmental and somatic dysfunction of lumbar region: Secondary | ICD-10-CM | POA: Diagnosis not present

## 2021-07-27 DIAGNOSIS — F4323 Adjustment disorder with mixed anxiety and depressed mood: Secondary | ICD-10-CM | POA: Diagnosis not present

## 2021-07-27 DIAGNOSIS — M5136 Other intervertebral disc degeneration, lumbar region: Secondary | ICD-10-CM | POA: Diagnosis not present

## 2021-07-27 DIAGNOSIS — M9905 Segmental and somatic dysfunction of pelvic region: Secondary | ICD-10-CM | POA: Diagnosis not present

## 2021-08-09 DIAGNOSIS — M7121 Synovial cyst of popliteal space [Baker], right knee: Secondary | ICD-10-CM | POA: Diagnosis not present

## 2021-08-10 DIAGNOSIS — M4306 Spondylolysis, lumbar region: Secondary | ICD-10-CM | POA: Diagnosis not present

## 2021-08-10 DIAGNOSIS — M9903 Segmental and somatic dysfunction of lumbar region: Secondary | ICD-10-CM | POA: Diagnosis not present

## 2021-08-10 DIAGNOSIS — M9905 Segmental and somatic dysfunction of pelvic region: Secondary | ICD-10-CM | POA: Diagnosis not present

## 2021-08-10 DIAGNOSIS — M5136 Other intervertebral disc degeneration, lumbar region: Secondary | ICD-10-CM | POA: Diagnosis not present

## 2021-08-15 DIAGNOSIS — M5136 Other intervertebral disc degeneration, lumbar region: Secondary | ICD-10-CM | POA: Diagnosis not present

## 2021-08-15 DIAGNOSIS — M9905 Segmental and somatic dysfunction of pelvic region: Secondary | ICD-10-CM | POA: Diagnosis not present

## 2021-08-15 DIAGNOSIS — M9903 Segmental and somatic dysfunction of lumbar region: Secondary | ICD-10-CM | POA: Diagnosis not present

## 2021-08-15 DIAGNOSIS — M4306 Spondylolysis, lumbar region: Secondary | ICD-10-CM | POA: Diagnosis not present

## 2021-08-23 DIAGNOSIS — M9905 Segmental and somatic dysfunction of pelvic region: Secondary | ICD-10-CM | POA: Diagnosis not present

## 2021-08-23 DIAGNOSIS — F4323 Adjustment disorder with mixed anxiety and depressed mood: Secondary | ICD-10-CM | POA: Diagnosis not present

## 2021-08-23 DIAGNOSIS — M9903 Segmental and somatic dysfunction of lumbar region: Secondary | ICD-10-CM | POA: Diagnosis not present

## 2021-08-23 DIAGNOSIS — M5136 Other intervertebral disc degeneration, lumbar region: Secondary | ICD-10-CM | POA: Diagnosis not present

## 2021-08-23 DIAGNOSIS — M4306 Spondylolysis, lumbar region: Secondary | ICD-10-CM | POA: Diagnosis not present

## 2021-09-05 DIAGNOSIS — M5136 Other intervertebral disc degeneration, lumbar region: Secondary | ICD-10-CM | POA: Diagnosis not present

## 2021-09-05 DIAGNOSIS — M9905 Segmental and somatic dysfunction of pelvic region: Secondary | ICD-10-CM | POA: Diagnosis not present

## 2021-09-05 DIAGNOSIS — M9903 Segmental and somatic dysfunction of lumbar region: Secondary | ICD-10-CM | POA: Diagnosis not present

## 2021-09-05 DIAGNOSIS — M4306 Spondylolysis, lumbar region: Secondary | ICD-10-CM | POA: Diagnosis not present

## 2021-09-18 DIAGNOSIS — M9905 Segmental and somatic dysfunction of pelvic region: Secondary | ICD-10-CM | POA: Diagnosis not present

## 2021-09-18 DIAGNOSIS — M5136 Other intervertebral disc degeneration, lumbar region: Secondary | ICD-10-CM | POA: Diagnosis not present

## 2021-09-18 DIAGNOSIS — M4306 Spondylolysis, lumbar region: Secondary | ICD-10-CM | POA: Diagnosis not present

## 2021-09-18 DIAGNOSIS — M9903 Segmental and somatic dysfunction of lumbar region: Secondary | ICD-10-CM | POA: Diagnosis not present

## 2021-09-27 DIAGNOSIS — F4323 Adjustment disorder with mixed anxiety and depressed mood: Secondary | ICD-10-CM | POA: Diagnosis not present

## 2021-10-02 DIAGNOSIS — M4306 Spondylolysis, lumbar region: Secondary | ICD-10-CM | POA: Diagnosis not present

## 2021-10-02 DIAGNOSIS — M9903 Segmental and somatic dysfunction of lumbar region: Secondary | ICD-10-CM | POA: Diagnosis not present

## 2021-10-02 DIAGNOSIS — M5136 Other intervertebral disc degeneration, lumbar region: Secondary | ICD-10-CM | POA: Diagnosis not present

## 2021-10-02 DIAGNOSIS — M9905 Segmental and somatic dysfunction of pelvic region: Secondary | ICD-10-CM | POA: Diagnosis not present

## 2021-10-04 DIAGNOSIS — M25552 Pain in left hip: Secondary | ICD-10-CM | POA: Diagnosis not present

## 2021-10-05 DIAGNOSIS — M1612 Unilateral primary osteoarthritis, left hip: Secondary | ICD-10-CM | POA: Diagnosis not present

## 2021-10-05 DIAGNOSIS — M25552 Pain in left hip: Secondary | ICD-10-CM | POA: Diagnosis not present

## 2021-10-09 DIAGNOSIS — M1612 Unilateral primary osteoarthritis, left hip: Secondary | ICD-10-CM | POA: Diagnosis not present

## 2021-10-09 DIAGNOSIS — M6281 Muscle weakness (generalized): Secondary | ICD-10-CM | POA: Diagnosis not present

## 2021-10-16 DIAGNOSIS — M9903 Segmental and somatic dysfunction of lumbar region: Secondary | ICD-10-CM | POA: Diagnosis not present

## 2021-10-16 DIAGNOSIS — M5136 Other intervertebral disc degeneration, lumbar region: Secondary | ICD-10-CM | POA: Diagnosis not present

## 2021-10-16 DIAGNOSIS — M9905 Segmental and somatic dysfunction of pelvic region: Secondary | ICD-10-CM | POA: Diagnosis not present

## 2021-10-16 DIAGNOSIS — M4306 Spondylolysis, lumbar region: Secondary | ICD-10-CM | POA: Diagnosis not present

## 2021-10-24 DIAGNOSIS — M6281 Muscle weakness (generalized): Secondary | ICD-10-CM | POA: Diagnosis not present

## 2021-10-24 DIAGNOSIS — M1612 Unilateral primary osteoarthritis, left hip: Secondary | ICD-10-CM | POA: Diagnosis not present

## 2021-10-30 ENCOUNTER — Ambulatory Visit (INDEPENDENT_AMBULATORY_CARE_PROVIDER_SITE_OTHER): Payer: Medicare Other | Admitting: Pulmonary Disease

## 2021-10-30 ENCOUNTER — Ambulatory Visit (INDEPENDENT_AMBULATORY_CARE_PROVIDER_SITE_OTHER): Payer: Medicare Other

## 2021-10-30 ENCOUNTER — Encounter: Payer: Self-pay | Admitting: Pulmonary Disease

## 2021-10-30 VITALS — BP 122/64 | HR 80 | Temp 98.8°F | Ht 69.0 in | Wt 288.0 lb

## 2021-10-30 DIAGNOSIS — G4733 Obstructive sleep apnea (adult) (pediatric): Secondary | ICD-10-CM

## 2021-10-30 DIAGNOSIS — R0600 Dyspnea, unspecified: Secondary | ICD-10-CM | POA: Diagnosis not present

## 2021-10-30 DIAGNOSIS — Z9989 Dependence on other enabling machines and devices: Secondary | ICD-10-CM

## 2021-10-30 DIAGNOSIS — M9903 Segmental and somatic dysfunction of lumbar region: Secondary | ICD-10-CM | POA: Diagnosis not present

## 2021-10-30 DIAGNOSIS — M4306 Spondylolysis, lumbar region: Secondary | ICD-10-CM | POA: Diagnosis not present

## 2021-10-30 DIAGNOSIS — R0609 Other forms of dyspnea: Secondary | ICD-10-CM

## 2021-10-30 DIAGNOSIS — J45909 Unspecified asthma, uncomplicated: Secondary | ICD-10-CM

## 2021-10-30 DIAGNOSIS — M5136 Other intervertebral disc degeneration, lumbar region: Secondary | ICD-10-CM | POA: Diagnosis not present

## 2021-10-30 DIAGNOSIS — M9905 Segmental and somatic dysfunction of pelvic region: Secondary | ICD-10-CM | POA: Diagnosis not present

## 2021-10-30 MED ORDER — BREZTRI AEROSPHERE 160-9-4.8 MCG/ACT IN AERO: 2.0000 | INHALATION_SPRAY | Freq: Two times a day (BID) | RESPIRATORY_TRACT | 0 refills | Status: DC

## 2021-10-30 MED ORDER — AZELASTINE HCL 0.1 % NA SOLN: 1.0000 | Freq: Every day | NASAL | 12 refills | Status: DC

## 2021-10-30 NOTE — Addendum Note (Signed)
Addended by: Alvin Critchley on: 10/30/2021 06:18 PM   Modules accepted: Orders
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
## 2021-10-30 NOTE — Patient Instructions (Signed)
Chest xray today  Breztri two puffs in the morning and two puffs in the evening, and rinse your mouth after each use  Stop using symbicort once you get breztri  Azelastine 1 spray in each nostril daily  Follow up in 6 months

## 2021-10-31 DIAGNOSIS — M25552 Pain in left hip: Secondary | ICD-10-CM | POA: Diagnosis not present

## 2021-11-10 DIAGNOSIS — F4323 Adjustment disorder with mixed anxiety and depressed mood: Secondary | ICD-10-CM | POA: Diagnosis not present

## 2021-11-20 DIAGNOSIS — M9905 Segmental and somatic dysfunction of pelvic region: Secondary | ICD-10-CM | POA: Diagnosis not present

## 2021-11-20 DIAGNOSIS — M4306 Spondylolysis, lumbar region: Secondary | ICD-10-CM | POA: Diagnosis not present

## 2021-11-20 DIAGNOSIS — M9903 Segmental and somatic dysfunction of lumbar region: Secondary | ICD-10-CM | POA: Diagnosis not present

## 2021-11-20 DIAGNOSIS — M5136 Other intervertebral disc degeneration, lumbar region: Secondary | ICD-10-CM | POA: Diagnosis not present

## 2021-11-21 ENCOUNTER — Telehealth: Payer: Self-pay | Admitting: Pulmonary Disease

## 2021-11-21 MED ORDER — PREDNISONE 10 MG PO TABS: ORAL_TABLET | ORAL | 0 refills | Status: DC

## 2021-11-21 NOTE — Telephone Encounter (Signed)
Please send script for prednisone 10 mg pill >> 3 pills daily for 2 days, 2 pills daily for 2 days, 1 pill daily for 2 days.  Please have him try using delsym twice per day as needed for cough.

## 2021-11-21 NOTE — Telephone Encounter (Signed)
Called and spoke with patient.  Patient stated he is having a dry, hacking cough for past few weeks.  Patient stated he is also experiencing increased sob.  Patient stated he is using Breztri 2 puffs in the morning and 2 puffs in the evening daily.  Patient stated he is using his Albuterol inhaler 2 puffs 3 or more times a day.   Patient is requesting OV with Dr. Halford Chessman or prescription to help with cough.   Patient is aware Dr. Juanetta Gosling schedule is full.     Message routed to Dr. Halford Chessman to advise

## 2021-11-21 NOTE — Telephone Encounter (Signed)
Called and spoke with patient.  Dr. Juanetta Gosling recommendations given.  Understanding stated.  Prednisone prescription sent to requested [facility].  Patient scheduled follow up OV with Beth, NP 12/01/21 at 1130.  Nothing further at this time.

## 2021-11-30 ENCOUNTER — Encounter: Payer: Self-pay | Admitting: Primary Care

## 2021-11-30 ENCOUNTER — Ambulatory Visit (INDEPENDENT_AMBULATORY_CARE_PROVIDER_SITE_OTHER): Payer: Medicare Other | Admitting: Primary Care

## 2021-11-30 VITALS — BP 128/62 | HR 77 | Temp 97.7°F | Ht 69.0 in | Wt 282.0 lb

## 2021-11-30 DIAGNOSIS — Z9989 Dependence on other enabling machines and devices: Secondary | ICD-10-CM

## 2021-11-30 DIAGNOSIS — J4540 Moderate persistent asthma, uncomplicated: Secondary | ICD-10-CM

## 2021-11-30 DIAGNOSIS — R059 Cough, unspecified: Secondary | ICD-10-CM

## 2021-11-30 DIAGNOSIS — R058 Other specified cough: Secondary | ICD-10-CM

## 2021-11-30 DIAGNOSIS — G4733 Obstructive sleep apnea (adult) (pediatric): Secondary | ICD-10-CM | POA: Diagnosis not present

## 2021-11-30 LAB — CBC WITH DIFFERENTIAL/PLATELET
Basophils Absolute: 0.1 10*3/uL (ref 0.0–0.1)
Basophils Relative: 1.2 % (ref 0.0–3.0)
Eosinophils Absolute: 0.2 10*3/uL (ref 0.0–0.7)
Eosinophils Relative: 4 % (ref 0.0–5.0)
HCT: 43 % (ref 39.0–52.0)
Hemoglobin: 14.3 g/dL (ref 13.0–17.0)
Lymphocytes Relative: 31.3 % (ref 12.0–46.0)
Lymphs Abs: 1.3 10*3/uL (ref 0.7–4.0)
MCHC: 33.1 g/dL (ref 30.0–36.0)
MCV: 87.1 fl (ref 78.0–100.0)
Monocytes Absolute: 0.6 10*3/uL (ref 0.1–1.0)
Monocytes Relative: 14.1 % — ABNORMAL HIGH (ref 3.0–12.0)
Neutro Abs: 2.1 10*3/uL (ref 1.4–7.7)
Neutrophils Relative %: 49.4 % (ref 43.0–77.0)
Platelets: 131 10*3/uL — ABNORMAL LOW (ref 150.0–400.0)
RBC: 4.94 Mil/uL (ref 4.22–5.81)
RDW: 15 % (ref 11.5–15.5)
WBC: 4.3 10*3/uL (ref 4.0–10.5)

## 2021-11-30 LAB — NITRIC OXIDE: Nitric Oxide: 50

## 2021-11-30 MED ORDER — PROMETHAZINE-DM 6.25-15 MG/5ML PO SYRP: 5.0000 mL | ORAL_SOLUTION | Freq: Four times a day (QID) | ORAL | 0 refills | Status: DC | PRN

## 2021-11-30 MED ORDER — FAMOTIDINE 20 MG PO TABS: 20.0000 mg | ORAL_TABLET | Freq: Every day | ORAL | 1 refills | Status: DC

## 2021-11-30 MED ORDER — PREDNISONE 10 MG PO TABS: ORAL_TABLET | ORAL | 0 refills | Status: DC

## 2021-11-30 MED ORDER — BREZTRI AEROSPHERE 160-9-4.8 MCG/ACT IN AERO: 2.0000 | INHALATION_SPRAY | Freq: Two times a day (BID) | RESPIRATORY_TRACT | 0 refills | Status: AC

## 2021-11-30 MED ORDER — AZELASTINE HCL 0.1 % NA SOLN: 1.0000 | Freq: Every day | NASAL | 12 refills | Status: DC

## 2021-11-30 NOTE — Assessment & Plan Note (Signed)
-   Patient is 87% compliant with CPAP use greater than 4 hours.  Current pressure settings 7 -15 cm H2O (95%- 10.4cm h20); Residual AHI of 0.7/hour. He reports not getting enough pressure from CPAP unit at night.  Recommend changing pressure from auto settings to a set pressure of 10 cm H2O.  Continue to encourage patient wear CPAP every night for minimum 4 to 6 hours or longer.
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
## 2021-11-30 NOTE — Assessment & Plan Note (Signed)
-   Postnasal drip and GERD likely contributing to cough.  Recommend patient start Astelin nasal spray daily and start famotidine 20 mg at bedtime.  Also send in prescription for promethazine DM to take every 6 hours as needed for cough suppression.

## 2021-11-30 NOTE — Assessment & Plan Note (Addendum)
-   Patient continues to have dry cough which is worse at night. Associated wheezing.  No improvement with Breztri or prednisone.  FENO today was 50.  We will send in additional prednisone taper.  We will also check allergy markers including CBC with differential and IgE.

## 2021-11-30 NOTE — Patient Instructions (Addendum)
Common cause of upper airway cough can we asthma, PND and GERD. We recommend treating all three and then we can slowly taper off medications to hopes cough does not return  FENO was elevated indicating that there is some inflammation in airways from asthma. We will also check labs today for allergy markers   Recommendations - Continue Breztri Aerosphere  - Please use Astelin nasal spray 1 spray per nostril daily (for post nasal drip possibly contributing to cough) - Start Famotidine '20mg'$  at bedtime (for possible reflux contributing to cough) - Sending in cough syrup called promethazine-DM to help suppress cough (this will replace delsym)  RX: - Prednisone taper as directed   Orders: - Adjust CPAP pressure 10cm h20  - Labs today (ordered)  Follow-up: - 2-4 weeks with Hunter Maize NP

## 2021-12-01 ENCOUNTER — Ambulatory Visit: Payer: Medicare Other | Admitting: Primary Care

## 2021-12-01 LAB — IGE: IgE (Immunoglobulin E), Serum: 120 kU/L — ABNORMAL HIGH (ref <=114)

## 2021-12-01 NOTE — Progress Notes (Signed)
Reviewed and agree with assessment/plan.   [name], MD [facility] Pulmonary/Critical Care 12/01/2021, 7:11 AM Pager:  [phone]

## 2021-12-05 NOTE — Progress Notes (Signed)
Allergy markers were elevated, IgE 120/ Eos 200. Depending on how he is doing we may want to discuss adding biologic at follow-up.

## 2021-12-07 ENCOUNTER — Ambulatory Visit (INDEPENDENT_AMBULATORY_CARE_PROVIDER_SITE_OTHER): Payer: Medicare Other | Admitting: Nurse Practitioner

## 2021-12-07 ENCOUNTER — Telehealth: Payer: Self-pay | Admitting: Primary Care

## 2021-12-07 ENCOUNTER — Encounter: Payer: Self-pay | Admitting: Nurse Practitioner

## 2021-12-07 VITALS — BP 110/80 | HR 74 | Ht 69.0 in | Wt 285.2 lb

## 2021-12-07 DIAGNOSIS — J4541 Moderate persistent asthma with (acute) exacerbation: Secondary | ICD-10-CM

## 2021-12-07 DIAGNOSIS — R058 Other specified cough: Secondary | ICD-10-CM | POA: Diagnosis not present

## 2021-12-07 DIAGNOSIS — R059 Cough, unspecified: Secondary | ICD-10-CM

## 2021-12-07 LAB — POCT EXHALED NITRIC OXIDE: FeNO level (ppb): 19

## 2021-12-07 MED ORDER — BENZONATATE 200 MG PO CAPS: 200.0000 mg | ORAL_CAPSULE | Freq: Three times a day (TID) | ORAL | 1 refills | Status: DC | PRN

## 2021-12-07 NOTE — Patient Instructions (Signed)
Continue Albuterol inhaler 2 puffs every 6 hours as needed for shortness of breath or wheezing. Notify if symptoms persist despite rescue inhaler/neb use.  Continue Breztri 2 puffs Twice daily. Brush tongue and rinse mouth afterwards  Continue pepcid 1 tab daily  Continue promethazine-DM syrup 5 mL every 6 hours as needed for cough Continue flonase nasal spray 2 sprays each nostril daily  Restart Xyzal 1 tab daily Tessalon perles (benzonatate) 1 capsule Three times a day for cough  Finish prednisone taper   Suppress your cough to allow your larynx (voice box) to heal.  Limit talking for the next few days. Avoid throat clearing. Work on cough suppression with the above recommended suppressants.  Use sugar free hard candies or non-menthol cough drops during this time to soothe your throat.  Warm tea with honey and lemon.   Follow up in 2 weeks with Dr. Halford Chessman, Roxan Diesel NP or Derl Barrow NP. If symptoms do not improve or worsen, please contact office for sooner follow up or seek emergency care.
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
## 2021-12-07 NOTE — Telephone Encounter (Signed)
The blood allergy profile IgE and blood eosinophils are slightly elevated suggesting airway inflammation along with the elevated Fino.  He might benefit from long-term biologic therapy against his asthma.  Meanwhile if he wants to do another prednisone taper we could do that 0 Take prednisone 40 mg daily x 2 days, then '20mg'$  daily x 2 days, then '10mg'$  daily x 2 days, then '5mg'$  daily x 2 days and stop   He should go to the emergency department if symptoms are uncontrolled between now and the next visit or schedule another acute visit

## 2021-12-07 NOTE — Telephone Encounter (Signed)
Primary Pulmonologist: [name] Last office visit and with whom: 11/30/2021 What do we see them for (pulmonary problems): Cough Last OV assessment/plan:   Moderate persistent asthma, uncomplicated - Patient continues to have dry cough which is worse at night. Associated wheezing.  No improvement with Breztri or prednisone.  FENO today was 50.  We will send in additional prednisone taper.  We will also check allergy markers including CBC with differential and IgE.    Upper airway cough syndrome - Postnasal drip and GERD likely contributing to cough.  Recommend patient start Astelin nasal spray daily and start famotidine 20 mg at bedtime.  Also send in prescription for promethazine DM to take every 6 hours as needed for cough suppression.   OSA on CPAP - Patient is 87% compliant with CPAP use greater than 4 hours.  Current pressure settings 7 -15 cm H2O (95%- 10.4cm h20); Residual AHI of 0.7/hour. He reports not getting enough pressure from CPAP unit at night.  Recommend changing pressure from auto settings to a set pressure of 10 cm H2O.  Continue to encourage patient wear CPAP every night for minimum 4 to 6 hours or longer  Was appointment offered to patient (explain)?  Yes, with [place] at noon. states he still has cough syrup in his system and unable to drive to appt   Reason for call: still has cough  (examples of things to ask: : When did symptoms start? Coughing spells this morning at 5:30am Fever? No Cough? Productive? Dry cough Color to sputum? More sputum than usual? Wheezing? Yes Have you needed increased oxygen? Are you taking your respiratory medications? What over the counter measures have you tried?)  No Known Allergies  Immunization History  Administered Date(s) Administered   Influenza Split 01/05/2009, 01/25/2010, 12/11/2010, 12/11/2011, 02/09/2013   Influenza Whole 05/10/2013   Influenza,inj,Quad PF,6+ Mos 12/30/2014, 12/13/2015, 11/11/2018, 12/19/2018, 12/15/2019    Influenza,inj,quad, With Preservative 12/24/2017   Influenza-Unspecified 02/09/2017   Moderna Covid-19 Vaccine Bivalent Booster 19yr & up 02/07/2021   Moderna Sars-Covid-2 Vaccination 05/04/2019, 05/16/2019, 06/16/2019   Tdap 06/22/2013

## 2021-12-08 ENCOUNTER — Encounter: Payer: Self-pay | Admitting: Nurse Practitioner

## 2021-12-08 MED ORDER — PREDNISONE 10 MG PO TABS: ORAL_TABLET | ORAL | 0 refills | Status: AC

## 2021-12-08 NOTE — Telephone Encounter (Signed)
Called and spoke with patient. He verbalized understanding of MR's recs.   Prednisone has been sent. Nothing further needed at time of call.

## 2021-12-08 NOTE — Assessment & Plan Note (Signed)
Slowly resolving exacerbation.  Still with persistent dry cough.  His FeNO was normal today; suspect that a lot of his cough is coming from upper airway irritation/cyclic cough.  Advised him to complete his previously prescribed prednisone taper.  Educated that I did not think he needed a steroid injection today given his normal FeNO and clear lung exam.  He will continue his scheduled bronchodilators.  We did discuss the role of biologic therapy in preventing future exacerbations.  He was provided with information on this and will review it with his wife.  Plans to discuss at follow-up.  Patient Instructions  Continue Albuterol inhaler 2 puffs every 6 hours as needed for shortness of breath or wheezing. Notify if symptoms persist despite rescue inhaler/neb use.  Continue Breztri 2 puffs Twice daily. Brush tongue and rinse mouth afterwards  Continue pepcid 1 tab daily  Continue promethazine-DM syrup 5 mL every 6 hours as needed for cough Continue flonase nasal spray 2 sprays each nostril daily  Restart Xyzal 1 tab daily Tessalon perles (benzonatate) 1 capsule Three times a day for cough  Finish prednisone taper   Suppress your cough to allow your larynx (voice box) to heal.  Limit talking for the next few days. Avoid throat clearing. Work on cough suppression with the above recommended suppressants.  Use sugar free hard candies or non-menthol cough drops during this time to soothe your throat.  Warm tea with honey and lemon.   Follow up in 2 weeks with Dr. Halford Chessman, Roxan Diesel NP or Derl Barrow NP. If symptoms do not improve or worsen, please contact office for sooner follow up or seek emergency care.

## 2021-12-08 NOTE — Assessment & Plan Note (Signed)
He has irritation to his throat and voice hoarseness.  See above.  Recommended targeting his cough with addition of tessalon. Ok to use otc delsym in place of phenergan DM during the day. Reviewed measures to limit throat/larynx irritation. Advised to use astelin daily and restart allergy medicine daily. Continue H2 blocker for GERD.

## 2021-12-11 ENCOUNTER — Telehealth: Payer: Self-pay | Admitting: *Deleted

## 2021-12-11 NOTE — Progress Notes (Signed)
Reviewed and agree with assessment/plan.   [name], MD [facility] Pulmonary/Critical Care 12/11/2021, 8:49 AM Pager:  [phone]

## 2021-12-11 NOTE — Telephone Encounter (Signed)
Not seeing where anyone tried to call pt. Pt has an upcoming appt scheduled with [place].

## 2021-12-12 DIAGNOSIS — M5136 Other intervertebral disc degeneration, lumbar region: Secondary | ICD-10-CM | POA: Diagnosis not present

## 2021-12-12 DIAGNOSIS — M9903 Segmental and somatic dysfunction of lumbar region: Secondary | ICD-10-CM | POA: Diagnosis not present

## 2021-12-12 DIAGNOSIS — M9905 Segmental and somatic dysfunction of pelvic region: Secondary | ICD-10-CM | POA: Diagnosis not present

## 2021-12-12 DIAGNOSIS — M4306 Spondylolysis, lumbar region: Secondary | ICD-10-CM | POA: Diagnosis not present

## 2021-12-18 ENCOUNTER — Encounter: Payer: Self-pay | Admitting: Nurse Practitioner

## 2021-12-18 ENCOUNTER — Ambulatory Visit (INDEPENDENT_AMBULATORY_CARE_PROVIDER_SITE_OTHER): Payer: Medicare Other | Admitting: Nurse Practitioner

## 2021-12-18 VITALS — BP 124/72 | HR 87 | Ht 69.0 in | Wt 285.6 lb

## 2021-12-18 DIAGNOSIS — G4733 Obstructive sleep apnea (adult) (pediatric): Secondary | ICD-10-CM

## 2021-12-18 DIAGNOSIS — J4551 Severe persistent asthma with (acute) exacerbation: Secondary | ICD-10-CM | POA: Diagnosis not present

## 2021-12-18 DIAGNOSIS — J309 Allergic rhinitis, unspecified: Secondary | ICD-10-CM | POA: Diagnosis not present

## 2021-12-18 LAB — POCT EXHALED NITRIC OXIDE: FeNO level (ppb): 58

## 2021-12-18 MED ORDER — PREDNISONE 10 MG PO TABS: ORAL_TABLET | ORAL | 0 refills | Status: DC

## 2021-12-18 NOTE — Assessment & Plan Note (Addendum)
Poorly controlled asthmatic bronchitis with recurrent flares recently; again with acute exacerbation with elevated exhaled nitric oxide. We will treat him with Depo 80 mg inj x 1 and prednisone taper. Recommended that we start him on biologic therapy given his recent exacerbations and elevated eosinophils. He was agreeable to this plan. Discussed benefits and risk of Dupixent - message sent to pharmacy team. Continue triple therapy inhaler regimen as maintenance and prn SABA.   Patient Instructions  Continue Breztri 2 puffs Twice daily. Brush tongue and rinse mouth afterwards  Continue Albuterol inhaler 2 puffs every 6 hours as needed for shortness of breath or wheezing. Notify if symptoms persist despite rescue inhaler/neb use.  Continue pepcid 1 tab daily  Continue promethazine-DM syrup 5 mL every 6 hours as needed for cough Continue flonase nasal spray 2 sprays each nostril daily Continue Zyrtec 1 tab daily Continue Tessalon perles (benzonatate) 1 capsule Three times a day for cough  Continue CPAP nightly, minimum 4-6 hours   Prednisone taper. 4 tabs for 2 days, then 3 tabs for 2 days, 2 tabs for 2 days, then 1 tab for 2 days, then stop. Take in AM with food. Start tomorrow Start Dupixent injections every 14 days. Someone will contact you from our pharmacy team to set this up.  Add on saline nasal gel after your flonase before you put your CPAP on at night    Follow up in 2 weeks with Dr. Halford Chessman, Roxan Diesel NP or Derl Barrow NP. If symptoms do not improve or worsen, please contact office for sooner follow up or seek emergency care.

## 2021-12-18 NOTE — Assessment & Plan Note (Signed)
Excellent compliance and continues to receive good benefit from nightly use.

## 2021-12-18 NOTE — Assessment & Plan Note (Signed)
Instructed to restart antihistamines at last OV. He has had some improvement in his nasal congestion with consistent use plus addition of flonase. Continue zyrtec for trigger prevention.

## 2021-12-18 NOTE — Progress Notes (Signed)
Reviewed and agree with assessment/plan.   [name], MD [facility] Pulmonary/Critical Care 12/18/2021, 5:14 PM Pager:  [phone]
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
## 2021-12-18 NOTE — Patient Instructions (Addendum)
Continue Breztri 2 puffs Twice daily. Brush tongue and rinse mouth afterwards  Continue Albuterol inhaler 2 puffs every 6 hours as needed for shortness of breath or wheezing. Notify if symptoms persist despite rescue inhaler/neb use.  Continue pepcid 1 tab daily  Continue promethazine-DM syrup 5 mL every 6 hours as needed for cough Continue flonase nasal spray 2 sprays each nostril daily Continue Zyrtec 1 tab daily Continue Tessalon perles (benzonatate) 1 capsule Three times a day for cough  Continue CPAP nightly, minimum 4-6 hours   Prednisone taper. 4 tabs for 2 days, then 3 tabs for 2 days, 2 tabs for 2 days, then 1 tab for 2 days, then stop. Take in AM with food. Start tomorrow Start Dupixent injections every 14 days. Someone will contact you from our pharmacy team to set this up.  Add on saline nasal gel after your flonase before you put your CPAP on at night    Follow up in 2 weeks with Dr. Halford Chessman, Roxan Diesel NP or Derl Barrow NP. If symptoms do not improve or worsen, please contact office for sooner follow up or seek emergency care.

## 2021-12-19 ENCOUNTER — Telehealth: Payer: Self-pay | Admitting: Pharmacist

## 2021-12-19 DIAGNOSIS — F4323 Adjustment disorder with mixed anxiety and depressed mood: Secondary | ICD-10-CM | POA: Diagnosis not present

## 2021-12-19 NOTE — Telephone Encounter (Addendum)
Received new start paperwork for [place]. Please start [place] BIV.   Dose: '600mg'$  SQ at Week 0, then '300mg'$  every 2 weeks thereafter   Dx: severe asthma (J45.50)   Abs eos of 200 on 11/30/21   Dupixent MyWay enrollment form placed in PAP pending info folder in pharmacy office.  Knox Saliva, PharmD, MPH, BCPS, CPP Clinical Pharmacist (Rheumatology and Pulmonology)

## 2021-12-20 ENCOUNTER — Encounter: Payer: Self-pay | Admitting: *Deleted

## 2021-12-20 NOTE — Telephone Encounter (Signed)
Submitted a Prior Authorization request to [facility] for [place] via CoverMyMeds. Will update once we receive a response.  Key: [id number]  Knox Saliva, PharmD, MPH, BCPS, CPP Clinical Pharmacist (Rheumatology and Pulmonology)

## 2021-12-21 ENCOUNTER — Other Ambulatory Visit (HOSPITAL_COMMUNITY): Payer: Self-pay

## 2021-12-21 NOTE — Telephone Encounter (Addendum)
Received notification from [facility] regarding a prior authorization for [place]. Authorization has been APPROVED from 12/20/21 to 12/21/22.   Unable to run test claim because patient must fill through [facility]: [phone]   Authorization #  [id number]  Patient's rx benefits are commercial. He is eligible for copay card.  Dupixent copay card activated today: ID: [id number] BIN: [id number] PCN: [id number] Group: [id number]  Patient is scheduled to see my on 01/01/22 immediately after his visit with Marland Kitchen, NP at [facility], PharmD, MPH, BCPS, CPP Clinical Pharmacist (Rheumatology and Pulmonology)

## 2021-12-25 DIAGNOSIS — Z0000 Encounter for general adult medical examination without abnormal findings: Secondary | ICD-10-CM | POA: Diagnosis not present

## 2021-12-25 DIAGNOSIS — R5383 Other fatigue: Secondary | ICD-10-CM | POA: Diagnosis not present

## 2021-12-25 DIAGNOSIS — Z6841 Body Mass Index (BMI) 40.0 and over, adult: Secondary | ICD-10-CM | POA: Diagnosis not present

## 2021-12-25 DIAGNOSIS — G4733 Obstructive sleep apnea (adult) (pediatric): Secondary | ICD-10-CM | POA: Diagnosis not present

## 2021-12-25 DIAGNOSIS — F5101 Primary insomnia: Secondary | ICD-10-CM | POA: Diagnosis not present

## 2021-12-25 DIAGNOSIS — J4540 Moderate persistent asthma, uncomplicated: Secondary | ICD-10-CM | POA: Diagnosis not present

## 2021-12-25 DIAGNOSIS — Z136 Encounter for screening for cardiovascular disorders: Secondary | ICD-10-CM | POA: Diagnosis not present

## 2021-12-25 DIAGNOSIS — Z125 Encounter for screening for malignant neoplasm of prostate: Secondary | ICD-10-CM | POA: Diagnosis not present

## 2021-12-29 NOTE — Progress Notes (Deleted)
HPI Patient presents today to [facility] to see pharmacy team for [place] new start. Past medical history includes severe persistent asthma, OSA on CPAP, chronic sinusitis, obesity, chronic pain, insomnia, HLD.   Last saw Roxan Diesel, NP on 12/18/21. He has received three prednisone bursts in the past 2 months.  Respiratory Medications Current regimen: Breztri 160-9-4.32mg (2 puffs twice daily), azelastin nasal spray, cetirizine '10mg'$  nightly  Patient reports no known adherence challenges  OBJECTIVE No Known Allergies  Outpatient Encounter Medications as of 01/01/2022  Medication Sig   acetaminophen (TYLENOL) 650 MG CR tablet Take 650 mg by mouth every 8 (eight) hours as needed for pain.   albuterol (VENTOLIN HFA) 108 (90 Base) MCG/ACT inhaler Inhale 2 puffs into the lungs every 4 (four) hours as needed for wheezing or shortness of breath.   azelastine (ASTELIN) 0.1 % nasal spray Place 1 spray into both nostrils daily. Use in each nostril as directed   benzonatate (TESSALON) 200 MG capsule Take 1 capsule (200 mg total) by mouth 3 (three) times daily as needed for cough.   Budeson-Glycopyrrol-Formoterol (BREZTRI AEROSPHERE) 160-9-4.8 MCG/ACT AERO Inhale 2 puffs into the lungs in the morning and at bedtime.   D 5000 125 MCG (5000 UT) capsule Take 5,000 Units by mouth daily.   famotidine (PEPCID) 20 MG tablet Take 1 tablet (20 mg total) by mouth at bedtime.   gabapentin (NEURONTIN) 100 MG capsule Take 1 capsule (100 mg total) by mouth 3 (three) times daily. (Patient taking differently: Take 100 mg by mouth 3 (three) times daily with meals as needed (pain).)   predniSONE (DELTASONE) 10 MG tablet 4 tabs for 2 days, then 3 tabs for 2 days, 2 tabs for 2 days, then 1 tab for 2 days, then stop   promethazine-dextromethorphan (PROMETHAZINE-DM) 6.25-15 MG/5ML syrup Take 5 mLs by mouth 4 (four) times daily as needed for cough.   zolpidem (AMBIEN) 10 MG tablet Take 10 mg by mouth at bedtime.    No facility-administered encounter medications on file as of 01/01/2022.     Immunization History  Administered Date(s) Administered   Influenza Split 01/05/2009, 01/25/2010, 12/11/2010, 12/11/2011, 02/09/2013   Influenza Whole 05/10/2013   Influenza,inj,Quad PF,6+ Mos 12/30/2014, 12/13/2015, 11/11/2018, 12/19/2018, 12/15/2019   Influenza,inj,quad, With Preservative 12/24/2017   Influenza-Unspecified 02/09/2017   Moderna Covid-19 Vaccine Bivalent Booster 135yr& up 02/07/2021   Moderna Sars-Covid-2 Vaccination 05/04/2019, 05/16/2019, 06/16/2019   Tdap 06/22/2013     PFTs    Latest Ref Rng & Units 11/06/2017    9:45 AM  PFT Results  FVC-Pre L 3.60   FVC-Predicted Pre % 90   FVC-Post L 3.79   FVC-Predicted Post % 95   Pre FEV1/FVC % % 88   Post FEV1/FCV % % 89   FEV1-Pre L 3.17   FEV1-Predicted Pre % 99   FEV1-Post L 3.39   DLCO uncorrected ml/min/mmHg 29.82   DLCO UNC% % 98   DLCO corrected ml/min/mmHg 29.26   DLCO COR %Predicted % 96   DLVA Predicted % 120      Eosinophils Most recent blood eosinophil count was 200 cells/microL taken on 11/30/21.   IgE: 120 on 11/30/21  Assessment   Biologics training for dupilumab (Dupixent)  Goals of therapy: Mechanism: human monoclonal IgG4 antibody that inhibits interleukin-4 and interleukin-13 cytokine-induced responses, including release of proinflammatory cytokines, chemokines, and IgE Reviewed that Dupixent is add-on medication and patient must continue maintenance inhaler regimen. Response to therapy: may take 4 months to determine efficacy.  Discussed that patients generally feel improvement sooner than 4 months.  Side effects: injection site reaction (6-18%), antibody development (5-16%), ophthalmic conjunctivitis (2-16%), transient blood eosinophilia (1-2%)  Dose: '600mg'$  at Week 0 (administered today in clinic) followed by '300mg'$  every 14 days thereafter  Administration/Storage:  Reviewed administration sites of thigh  or abdomen (at least 2-3 inches away from abdomen). Reviewed the upper arm is only appropriate if caregiver is administering injection  Do not shake pen/syringe as this could lead to product foaming or precipitation. Do not use if solution is discolored or contains particulate matter or if window on prefilled pen is yellow (indicates pen has been used).  Reviewed storage of medication in refrigerator. Reviewed that [place] can be stored at room temperature in unopened carton for up to 14 days.  Access: Approval of Dupixent through: insurance Patient enrolled into copay card program to help with copay assistance.  Patient self-administered Dupixent '300mg'$ /45m x 2 (total dose '600mg'$ ) in {injsitedsg:28167} and {injsitedsg:28167} using sample Dupixent '300mg'$ /283mautoinjector pen NDC: *** Lot: *** Expiration: ***  Patient monitored for 30 minutes for adverse reaction.  Patient tolerated ***. Injection site checked and no ***.  Medication Reconciliation  A drug regimen assessment was performed, including review of allergies, interactions, disease-state management, dosing and immunization history. Medications were reviewed with the patient, including name, instructions, indication, goals of therapy, potential side effects, importance of adherence, and safe use.  Drug interaction(s): none noted  Immunizations  Patient is indicated for the influenzae, pneumonia, and shingles vaccinations. Patient has received *** COVID19 vaccines.   PLAN Continue Dupixent '300mg'$  every 14 days.  Next dose is due 01/15/22 and every 14 days thereafter. Rx sent to: [id number]  Patient provided with pharmacy phone number and advised to call later this week to schedule shipment to home. Patient provided with copay card information to provide to pharmacy if quoted copay exceeds $5 per month. Continue maintenance asthma regimen of: ***  All questions encouraged and answered.  Instructed  patient to reach out with any further questions or concerns.  Thank you for allowing pharmacy to participate in this patient's care.  This appointment required *** minutes of patient care (this includes precharting, chart review, review of results, face-to-face care, etc.).  DeKnox SalivaPharmD, MPH, BCPS, CPP Clinical Pharmacist (Rheumatology and Pulmonology)

## 2021-12-29 NOTE — Patient Instructions (Incomplete)
Your next [place] dose is due on 01/15/22, 01/29/22, and every 14 days thereafter  {continuestartchange:25972}  Your prescription will be shipped from [facility]. Their phone number is [phone] Please call to schedule shipment and confirm address. They will mail your medication to your home.  Your copay should be affordable. If you call the pharmacy and it is not affordable, please double-check that they are billing through your copay card as secondary coverage. That copay card information is: ID: [id number] BIN: [id number] PCN: LOYALTY Group: [id number]  You will need to be seen by your provider in 3 to 4 months to assess how [place] is working for you. Please ensure you have a follow-up appointment scheduled in January or February 2023. Call our clinic if you need to make this appointment.  How to manage an injection site reaction: Remember the 5 C's: COUNTER - leave on the counter at least 30 minutes but up to overnight to bring medication to room temperature. This may help prevent stinging COLD - place something cold (like an ice gel pack or cold water bottle) on the injection site just before cleansing with alcohol. This may help reduce pain CLARITIN - use Claritin (generic name is loratadine) for the first two weeks of treatment or the day of, the day before, and the day after injecting. This will help to minimize injection site reactions CORTISONE CREAM - apply if injection site is irritated and itching CALL ME - if injection site reaction is bigger than the size of your fist, looks infected, blisters, or if you develop hives

## 2022-01-01 ENCOUNTER — Encounter: Payer: Self-pay | Admitting: Nurse Practitioner

## 2022-01-01 ENCOUNTER — Ambulatory Visit (INDEPENDENT_AMBULATORY_CARE_PROVIDER_SITE_OTHER): Payer: Medicare Other | Admitting: Nurse Practitioner

## 2022-01-01 ENCOUNTER — Other Ambulatory Visit: Payer: Medicare Other | Admitting: Pharmacist

## 2022-01-01 VITALS — BP 118/78 | HR 78 | Ht 69.0 in | Wt 285.4 lb

## 2022-01-01 DIAGNOSIS — J4550 Severe persistent asthma, uncomplicated: Secondary | ICD-10-CM | POA: Diagnosis not present

## 2022-01-01 DIAGNOSIS — J4551 Severe persistent asthma with (acute) exacerbation: Secondary | ICD-10-CM | POA: Diagnosis not present

## 2022-01-01 DIAGNOSIS — J45909 Unspecified asthma, uncomplicated: Secondary | ICD-10-CM | POA: Insufficient documentation

## 2022-01-01 DIAGNOSIS — Z23 Encounter for immunization: Secondary | ICD-10-CM

## 2022-01-01 LAB — POCT EXHALED NITRIC OXIDE: FeNO level (ppb): 35

## 2022-01-01 NOTE — Patient Instructions (Addendum)
Continue Breztri 2 puffs Twice daily. Brush tongue and rinse mouth afterwards  Continue Albuterol inhaler 2 puffs every 6 hours as needed for shortness of breath or wheezing. Notify if symptoms persist despite rescue inhaler/neb use.  Continue pepcid 1 tab daily  Continue flonase nasal spray 2 sprays each nostril daily Continue Zyrtec 1 tab daily Continue CPAP nightly, minimum 4-6 hours  Continue saline nasal gel after your flonase before you put your CPAP on at night   Flu and pneumonia today    Follow up in 3 months with Dr. Halford Chessman or Roxan Diesel NP. If symptoms do not improve or worsen, please contact office for sooner follow up or seek emergency care.

## 2022-01-01 NOTE — Assessment & Plan Note (Signed)
Severe asthma with allergic phenotype; recently exacerbating. Treated with 3 steroid courses. He is clinically improved today with reduced exhaled nitric oxide. He was anticipated to start [place]; hesitancy surrounding side effect profile after discussing with his PCP. Re-educated on purpose, efficacy, and potential side effects. We discussed that dupixent is not an immunosuppressant. Impacts of chronic/frequent steroid use discussed as well. He decided that he would like to hold off on starting biologics for now; if he has another exacerbation, he will reconsider. Respect his decision. He will continue on triple therapy regimen with Breztri. Target trigger prevention with Zyrtec and add on singulair.   Patient Instructions  Continue Breztri 2 puffs Twice daily. Brush tongue and rinse mouth afterwards  Continue Albuterol inhaler 2 puffs every 6 hours as needed for shortness of breath or wheezing. Notify if symptoms persist despite rescue inhaler/neb use.  Continue pepcid 1 tab daily  Continue flonase nasal spray 2 sprays each nostril daily Continue Zyrtec 1 tab daily Continue CPAP nightly, minimum 4-6 hours  Continue saline nasal gel after your flonase before you put your CPAP on at night   Flu and pneumonia today    Follow up in 3 months with Dr. Halford Chessman or Roxan Diesel NP. If symptoms do not improve or worsen, please contact office for sooner follow up or seek emergency care.
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
## 2022-01-01 NOTE — Telephone Encounter (Signed)
Patient had appt with Roxan Diesel, NP today and they have settled on deferring Dupixent initiation for now. Pharmacy team will close encounter. Please let us know if any need to revisit or reinvestigate benefits in future  Knox Saliva, PharmD, MPH, BCPS, CPP Clinical Pharmacist (Rheumatology and Pulmonology)

## 2022-01-02 ENCOUNTER — Other Ambulatory Visit: Payer: Medicare Other | Admitting: Pharmacist

## 2022-01-02 DIAGNOSIS — M9905 Segmental and somatic dysfunction of pelvic region: Secondary | ICD-10-CM | POA: Diagnosis not present

## 2022-01-02 DIAGNOSIS — M5136 Other intervertebral disc degeneration, lumbar region: Secondary | ICD-10-CM | POA: Diagnosis not present

## 2022-01-02 DIAGNOSIS — M4306 Spondylolysis, lumbar region: Secondary | ICD-10-CM | POA: Diagnosis not present

## 2022-01-02 DIAGNOSIS — M9903 Segmental and somatic dysfunction of lumbar region: Secondary | ICD-10-CM | POA: Diagnosis not present

## 2022-01-02 NOTE — Progress Notes (Signed)
Reviewed and agree with assessment/plan.   [name], MD [facility] Pulmonary/Critical Care 01/02/2022, 8:20 AM Pager:  [phone]

## 2022-01-16 DIAGNOSIS — M4306 Spondylolysis, lumbar region: Secondary | ICD-10-CM | POA: Diagnosis not present

## 2022-01-16 DIAGNOSIS — M9905 Segmental and somatic dysfunction of pelvic region: Secondary | ICD-10-CM | POA: Diagnosis not present

## 2022-01-16 DIAGNOSIS — M5136 Other intervertebral disc degeneration, lumbar region: Secondary | ICD-10-CM | POA: Diagnosis not present

## 2022-01-16 DIAGNOSIS — M9903 Segmental and somatic dysfunction of lumbar region: Secondary | ICD-10-CM | POA: Diagnosis not present

## 2022-01-30 DIAGNOSIS — M5136 Other intervertebral disc degeneration, lumbar region: Secondary | ICD-10-CM | POA: Diagnosis not present

## 2022-01-30 DIAGNOSIS — M4306 Spondylolysis, lumbar region: Secondary | ICD-10-CM | POA: Diagnosis not present

## 2022-01-30 DIAGNOSIS — M9905 Segmental and somatic dysfunction of pelvic region: Secondary | ICD-10-CM | POA: Diagnosis not present

## 2022-01-30 DIAGNOSIS — M9903 Segmental and somatic dysfunction of lumbar region: Secondary | ICD-10-CM | POA: Diagnosis not present

## 2022-02-09 ENCOUNTER — Telehealth: Payer: Self-pay | Admitting: Gastroenterology

## 2022-02-09 NOTE — Telephone Encounter (Signed)
Left message on machine to call back  

## 2022-02-09 NOTE — Telephone Encounter (Signed)
Inbound call from patient stating that he is having issues with nausea, not feeling well and not having an appetite. Patient stated that he had a colonoscopy in 2022 and was advised to repeat after 5 years. Patient is requesting a call back due to wanting to check on recall date because we have a recall in for 2032. Patient was also scheduled for  an OV with Ellouise Newer on 1/5 at 11:00 to discuss his current issues and is wanting to see if he can be seen sooner. Please advise.

## 2022-02-12 NOTE — Telephone Encounter (Signed)
Left message on machine to call back  

## 2022-02-13 DIAGNOSIS — M9903 Segmental and somatic dysfunction of lumbar region: Secondary | ICD-10-CM | POA: Diagnosis not present

## 2022-02-13 DIAGNOSIS — M5136 Other intervertebral disc degeneration, lumbar region: Secondary | ICD-10-CM | POA: Diagnosis not present

## 2022-02-13 DIAGNOSIS — M9905 Segmental and somatic dysfunction of pelvic region: Secondary | ICD-10-CM | POA: Diagnosis not present

## 2022-02-13 DIAGNOSIS — M4306 Spondylolysis, lumbar region: Secondary | ICD-10-CM | POA: Diagnosis not present

## 2022-02-13 NOTE — Telephone Encounter (Signed)
I have spoken with the pt and advised that he is due for colon in 2027.  He has an appt with Anderson Malta on 1/5 for nausea and abd pain. He will be called if we have a cancellation in the meantime. If his pain is worse he will call back or go to ED/UC.

## 2022-02-13 NOTE — Telephone Encounter (Signed)
Left message on machine to call back  

## 2022-02-14 DIAGNOSIS — R1030 Lower abdominal pain, unspecified: Secondary | ICD-10-CM | POA: Diagnosis not present

## 2022-02-14 DIAGNOSIS — Z6841 Body Mass Index (BMI) 40.0 and over, adult: Secondary | ICD-10-CM | POA: Diagnosis not present

## 2022-02-14 DIAGNOSIS — K115 Sialolithiasis: Secondary | ICD-10-CM | POA: Diagnosis not present

## 2022-02-16 DIAGNOSIS — F4323 Adjustment disorder with mixed anxiety and depressed mood: Secondary | ICD-10-CM | POA: Diagnosis not present

## 2022-02-19 ENCOUNTER — Other Ambulatory Visit: Payer: Self-pay | Admitting: Physician Assistant

## 2022-02-19 DIAGNOSIS — R1030 Lower abdominal pain, unspecified: Secondary | ICD-10-CM

## 2022-02-27 ENCOUNTER — Ambulatory Visit
Admission: RE | Admit: 2022-02-27 | Discharge: 2022-02-27 | Disposition: A | Discharge status: Home / Self Care | Payer: Medicare Other | Source: Ambulatory Visit | Attending: Physician Assistant | Admitting: Physician Assistant

## 2022-02-27 DIAGNOSIS — R109 Unspecified abdominal pain: Secondary | ICD-10-CM | POA: Diagnosis not present

## 2022-02-27 DIAGNOSIS — M9903 Segmental and somatic dysfunction of lumbar region: Secondary | ICD-10-CM | POA: Diagnosis not present

## 2022-02-27 DIAGNOSIS — M9905 Segmental and somatic dysfunction of pelvic region: Secondary | ICD-10-CM | POA: Diagnosis not present

## 2022-02-27 DIAGNOSIS — M4306 Spondylolysis, lumbar region: Secondary | ICD-10-CM | POA: Diagnosis not present

## 2022-02-27 DIAGNOSIS — M5136 Other intervertebral disc degeneration, lumbar region: Secondary | ICD-10-CM | POA: Diagnosis not present

## 2022-02-27 DIAGNOSIS — R1030 Lower abdominal pain, unspecified: Secondary | ICD-10-CM

## 2022-03-13 DIAGNOSIS — M5136 Other intervertebral disc degeneration, lumbar region: Secondary | ICD-10-CM | POA: Diagnosis not present

## 2022-03-13 DIAGNOSIS — M9903 Segmental and somatic dysfunction of lumbar region: Secondary | ICD-10-CM | POA: Diagnosis not present

## 2022-03-13 DIAGNOSIS — M9905 Segmental and somatic dysfunction of pelvic region: Secondary | ICD-10-CM | POA: Diagnosis not present

## 2022-03-13 DIAGNOSIS — M4306 Spondylolysis, lumbar region: Secondary | ICD-10-CM | POA: Diagnosis not present

## 2022-03-16 ENCOUNTER — Other Ambulatory Visit (INDEPENDENT_AMBULATORY_CARE_PROVIDER_SITE_OTHER): Payer: Medicare Other

## 2022-03-16 ENCOUNTER — Ambulatory Visit (INDEPENDENT_AMBULATORY_CARE_PROVIDER_SITE_OTHER): Payer: Medicare Other | Admitting: Physician Assistant

## 2022-03-16 ENCOUNTER — Encounter: Payer: Self-pay | Admitting: Physician Assistant

## 2022-03-16 VITALS — BP 116/74 | HR 88 | Ht 69.0 in | Wt 295.1 lb

## 2022-03-16 DIAGNOSIS — R1032 Left lower quadrant pain: Secondary | ICD-10-CM

## 2022-03-16 DIAGNOSIS — R110 Nausea: Secondary | ICD-10-CM

## 2022-03-16 LAB — CBC WITH DIFFERENTIAL/PLATELET
Basophils Absolute: 0 10*3/uL (ref 0.0–0.1)
Basophils Relative: 1.1 % (ref 0.0–3.0)
Eosinophils Absolute: 0.2 10*3/uL (ref 0.0–0.7)
Eosinophils Relative: 5 % (ref 0.0–5.0)
HCT: 45 % (ref 39.0–52.0)
Hemoglobin: 15 g/dL (ref 13.0–17.0)
Lymphocytes Relative: 36.7 % (ref 12.0–46.0)
Lymphs Abs: 1.4 10*3/uL (ref 0.7–4.0)
MCHC: 33.3 g/dL (ref 30.0–36.0)
MCV: 86.8 fl (ref 78.0–100.0)
Monocytes Absolute: 0.4 10*3/uL (ref 0.1–1.0)
Monocytes Relative: 11.5 % (ref 3.0–12.0)
Neutro Abs: 1.8 10*3/uL (ref 1.4–7.7)
Neutrophils Relative %: 45.7 % (ref 43.0–77.0)
Platelets: 142 10*3/uL — ABNORMAL LOW (ref 150.0–400.0)
RBC: 5.18 Mil/uL (ref 4.22–5.81)
RDW: 14.3 % (ref 11.5–15.5)
WBC: 3.9 10*3/uL — ABNORMAL LOW (ref 4.0–10.5)

## 2022-03-16 LAB — COMPREHENSIVE METABOLIC PANEL
ALT: 18 U/L (ref 0–53)
AST: 18 U/L (ref 0–37)
Albumin: 4.3 g/dL (ref 3.5–5.2)
Alkaline Phosphatase: 75 U/L (ref 39–117)
BUN: 15 mg/dL (ref 6–23)
CO2: 28 mEq/L (ref 19–32)
Calcium: 9 mg/dL (ref 8.4–10.5)
Chloride: 106 mEq/L (ref 96–112)
Creatinine, Ser: 1.28 mg/dL (ref 0.40–1.50)
GFR: 63.63 mL/min (ref >60.00)
Glucose, Bld: 87 mg/dL (ref 70–99)
Potassium: 3.9 mEq/L (ref 3.5–5.1)
Sodium: 138 mEq/L (ref 135–145)
Total Bilirubin: 0.4 mg/dL (ref 0.2–1.2)
Total Protein: 7.3 g/dL (ref 6.0–8.3)

## 2022-03-16 MED ORDER — ALPRAZOLAM 1 MG PO TABS: ORAL_TABLET | ORAL | 0 refills | Status: DC

## 2022-03-16 NOTE — Patient Instructions (Addendum)
Your provider has requested that you go to the basement level for lab work before leaving today. Press "B" on the elevator. The lab is located at the first door on the left as you exit the elevator.  We have sent the following medications to your pharmacy for you to pick up at your convenience: Xanax 1 mg- Take 30-60 minutes prior to your CT scan. You will need someone to drive you to and from your exam after taking this medication.  You have been scheduled for a CT scan of the abdomen and pelvis at [facility], 1st floor Radiology. You are scheduled on Monday, 03/19/22 at 1:15 pm. You should arrive 30 minutes prior to your appointment time for registration.    Please follow the written instructions below on the day of your exam:   1) Do not eat anything after 9:15 am (4 hours prior to your test)   2) You will need to drink two 8 ounce glasses of water 30 minutes prior to coming for your test.   You may take any medications as prescribed with a small amount of water, if necessary. If you take any of the following medications: METFORMIN, GLUCOPHAGE, [place], AVANDAMET, RIOMET, FORTAMET, [place] MET, JANUMET, GLUMETZA or METAGLIP, you MAY be asked to HOLD this medication 48 hours AFTER the exam.   If you have any questions regarding your exam or if you need to reschedule, you may call [facility] at [phone] between the hours of 8:00 am and 5:00 pm, Monday-Friday.    _______________________________________________________  If you are age 54 or older, your body mass index should be between 23-30. Your Body mass index is 43.58 kg/m. If this is out of the aforementioned range listed, please consider follow up with your Primary Care Provider.  If you are age 52 or younger, your body mass index should be between 19-25. Your Body mass index is 43.58 kg/m. If this is out of the aformentioned range listed, please consider follow up with your Primary Care Provider.    ________________________________________________________  The [facility] providers would like to encourage you to use [facility] to communicate with providers for non-urgent requests or questions.  Due to long hold times on the telephone, sending your provider a message by [facility] may be a faster and more efficient way to get a response.  Please allow 48 business hours for a response.  Please remember that this is for non-urgent requests.  _______________________________________________________  Due to recent changes in healthcare laws, you may see the results of your imaging and laboratory studies on MyChart before your provider has had a chance to review them.  We understand that in some cases there may be results that are confusing or concerning to you. Not all laboratory results come back in the same time frame and the provider may be waiting for multiple results in order to interpret others.  Please give [place] 48 hours in order for your provider to thoroughly review all the results before contacting the office for clarification of your results.
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
## 2022-03-17 NOTE — Progress Notes (Signed)
Attending Physician's Attestation   I have reviewed the chart.   I agree with the Advanced Practitioner's note, impression, and recommendations with any updates as below. Cross-sectional imaging is the next step in evaluation for this patient.  The colonoscopy was reviewed and although diverticulosis can be missed, if significant diverticulitis were the reason for symptoms, we would have likely seen something on the previous colonoscopy performed by Dr. Ardis Hughs.  Time will tell.  If he cannot tolerate CT even with antianxiolytics we can consider a short course of 70 then days of antibiotics for potential diverticulitis.  Otherwise agree with antispasmodics.   Justice Britain, MD [facility] Advanced Endoscopy Office # [phone] Agree that

## 2022-03-19 ENCOUNTER — Ambulatory Visit (HOSPITAL_COMMUNITY)
Admission: RE | Admit: 2022-03-19 | Discharge: 2022-03-19 | Disposition: A | Discharge status: Home / Self Care | Payer: Medicare Other | Source: Ambulatory Visit | Attending: Physician Assistant | Admitting: Physician Assistant

## 2022-03-19 DIAGNOSIS — R110 Nausea: Secondary | ICD-10-CM | POA: Diagnosis not present

## 2022-03-19 DIAGNOSIS — R1032 Left lower quadrant pain: Secondary | ICD-10-CM | POA: Diagnosis not present

## 2022-03-19 DIAGNOSIS — N200 Calculus of kidney: Secondary | ICD-10-CM | POA: Diagnosis not present

## 2022-03-19 MED ORDER — IOHEXOL 300 MG/ML  SOLN
100.0000 mL | Freq: Once | INTRAMUSCULAR | Status: AC | PRN
  Administered 2022-03-19: 100 mL via INTRAVENOUS

## 2022-03-19 MED ORDER — SODIUM CHLORIDE (PF) 0.9 % IJ SOLN
INTRAMUSCULAR | Status: AC
  Filled 2022-03-19: qty 50

## 2022-03-20 ENCOUNTER — Telehealth: Payer: Self-pay | Admitting: Physician Assistant

## 2022-03-20 ENCOUNTER — Other Ambulatory Visit: Payer: Self-pay

## 2022-03-20 MED ORDER — HYOSCYAMINE SULFATE 0.125 MG SL SUBL: SUBLINGUAL_TABLET | SUBLINGUAL | 2 refills | Status: DC

## 2022-03-20 NOTE — Telephone Encounter (Signed)
PT is calling to get results of ct scan. Please advise

## 2022-03-20 NOTE — Telephone Encounter (Signed)
Returned call to patient. See 03/20/22 CT result note for details.

## 2022-03-23 DIAGNOSIS — F4323 Adjustment disorder with mixed anxiety and depressed mood: Secondary | ICD-10-CM | POA: Diagnosis not present
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
## 2022-04-03 DIAGNOSIS — M4306 Spondylolysis, lumbar region: Secondary | ICD-10-CM | POA: Diagnosis not present

## 2022-04-03 DIAGNOSIS — M9905 Segmental and somatic dysfunction of pelvic region: Secondary | ICD-10-CM | POA: Diagnosis not present

## 2022-04-03 DIAGNOSIS — M9903 Segmental and somatic dysfunction of lumbar region: Secondary | ICD-10-CM | POA: Diagnosis not present

## 2022-04-03 DIAGNOSIS — M5136 Other intervertebral disc degeneration, lumbar region: Secondary | ICD-10-CM | POA: Diagnosis not present

## 2022-04-04 ENCOUNTER — Encounter: Payer: Self-pay | Admitting: Cardiovascular Disease

## 2022-04-04 ENCOUNTER — Ambulatory Visit: Payer: Medicare Other | Attending: Cardiovascular Disease | Admitting: Cardiovascular Disease

## 2022-04-04 VITALS — BP 100/74 | HR 78 | Ht 69.0 in | Wt 293.2 lb

## 2022-04-04 DIAGNOSIS — E782 Mixed hyperlipidemia: Secondary | ICD-10-CM | POA: Diagnosis not present

## 2022-04-04 DIAGNOSIS — J45998 Other asthma: Secondary | ICD-10-CM | POA: Insufficient documentation

## 2022-04-04 DIAGNOSIS — R079 Chest pain, unspecified: Secondary | ICD-10-CM | POA: Insufficient documentation

## 2022-04-04 NOTE — Patient Instructions (Signed)
Medication Instructions:  Your physician recommends that you continue on your current medications as directed. Please refer to the Current Medication list given to you today.  *If you need a refill on your cardiac medications before your next appointment, please call your pharmacy*   Lab Work: NONE If you have labs (blood work) drawn today and your tests are completely normal, you will receive your results only by: [place] (if you have MyChart) OR A paper copy in the mail If you have any lab test that is abnormal or we need to change your treatment, we will call you to review the results.   Testing/Procedures: NONE   Follow-Up: At [facility], you and your health needs are our priority.  As part of our continuing mission to provide you with exceptional heart care, we have created designated Provider Care Teams.  These Care Teams include your primary Cardiologist (physician) and Advanced Practice Providers (APPs -  Physician Assistants and Nurse Practitioners) who all work together to provide you with the care you need, when you need it.  We recommend signing up for the patient portal called "MyChart".  Sign up information is provided on this After Visit Summary.  MyChart is used to connect with patients for Virtual Visits (Telemedicine).  Patients are able to view lab/test results, encounter notes, upcoming appointments, etc.  Non-urgent messages can be sent to your provider as well.   To learn more about what you can do with MyChart, go to [email or web address].    Your next appointment:   1 year(s)  Provider:   Jenkins Rouge, MD     Other Instructions Katy [facility]- Ortho

## 2022-04-17 ENCOUNTER — Ambulatory Visit (INDEPENDENT_AMBULATORY_CARE_PROVIDER_SITE_OTHER): Payer: Medicare Other | Admitting: Physician Assistant

## 2022-04-17 ENCOUNTER — Encounter: Payer: Self-pay | Admitting: Physician Assistant

## 2022-04-17 VITALS — BP 118/74 | HR 85 | Ht 69.0 in | Wt 298.6 lb

## 2022-04-17 DIAGNOSIS — Z800 Family history of malignant neoplasm of digestive organs: Secondary | ICD-10-CM

## 2022-04-17 DIAGNOSIS — M25552 Pain in left hip: Secondary | ICD-10-CM

## 2022-04-17 DIAGNOSIS — M9903 Segmental and somatic dysfunction of lumbar region: Secondary | ICD-10-CM | POA: Diagnosis not present

## 2022-04-17 DIAGNOSIS — M4306 Spondylolysis, lumbar region: Secondary | ICD-10-CM | POA: Diagnosis not present

## 2022-04-17 DIAGNOSIS — R110 Nausea: Secondary | ICD-10-CM

## 2022-04-17 DIAGNOSIS — R1032 Left lower quadrant pain: Secondary | ICD-10-CM

## 2022-04-17 DIAGNOSIS — M9905 Segmental and somatic dysfunction of pelvic region: Secondary | ICD-10-CM | POA: Diagnosis not present

## 2022-04-17 DIAGNOSIS — M5136 Other intervertebral disc degeneration, lumbar region: Secondary | ICD-10-CM | POA: Diagnosis not present
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
## 2022-04-17 NOTE — Patient Instructions (Addendum)
You have been scheduled for an endoscopy. Please follow written instructions given to you at your visit today. If you use inhalers (even only as needed), please bring them with you on the day of your procedure.   Discontinue Hyoscyamine and Pepcid   We sent a referral to [facility] they will contact you to schedule appointment.  If your blood pressure at your visit was 140/90 or greater, please contact your primary care physician to follow up on this.  _______________________________________________________  If you are age 82 or older, your body mass index should be between 23-30. Your Body mass index is 44.1 kg/m. If this is out of the aforementioned range listed, please consider follow up with your Primary Care Provider.  If you are age 82 or younger, your body mass index should be between 19-25. Your Body mass index is 44.1 kg/m. If this is out of the aformentioned range listed, please consider follow up with your Primary Care Provider.   ________________________________________________________  The [facility] providers would like to encourage you to use [facility] to communicate with providers for non-urgent requests or questions.  Due to long hold times on the telephone, sending your provider a message by [facility] may be a faster and more efficient way to get a response.  Please allow 48 business hours for a response.  Please remember that this is for non-urgent requests.   Due to recent changes in healthcare laws, you may see the results of your imaging and laboratory studies on MyChart before your provider has had a chance to review them.  We understand that in some cases there may be results that are confusing or concerning to you. Not all laboratory results come back in the same time frame and the provider may be waiting for multiple results in order to interpret others.  Please give [place] 48 hours in order for your provider to thoroughly review all the results before contacting  the office for clarification of your results.    Thank you for entrusting me with your care and choosing [facility]. Ellouise Newer PA-C

## 2022-04-17 NOTE — Progress Notes (Signed)
I agree with the assessment and plan as outlined by [name]. 
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
## 2022-04-19 ENCOUNTER — Encounter (HOSPITAL_BASED_OUTPATIENT_CLINIC_OR_DEPARTMENT_OTHER): Payer: Self-pay | Admitting: Pulmonary Disease

## 2022-04-19 ENCOUNTER — Encounter: Payer: Self-pay | Admitting: Family Medicine

## 2022-04-19 ENCOUNTER — Ambulatory Visit (INDEPENDENT_AMBULATORY_CARE_PROVIDER_SITE_OTHER): Payer: Medicare Other | Admitting: Pulmonary Disease

## 2022-04-19 ENCOUNTER — Ambulatory Visit (INDEPENDENT_AMBULATORY_CARE_PROVIDER_SITE_OTHER): Payer: Medicare Other | Admitting: Family Medicine

## 2022-04-19 VITALS — BP 116/82 | HR 96 | Ht 69.0 in | Wt 298.0 lb

## 2022-04-19 VITALS — BP 102/70 | HR 84 | Ht 69.0 in | Wt 296.6 lb

## 2022-04-19 DIAGNOSIS — G4733 Obstructive sleep apnea (adult) (pediatric): Secondary | ICD-10-CM

## 2022-04-19 DIAGNOSIS — M47819 Spondylosis without myelopathy or radiculopathy, site unspecified: Secondary | ICD-10-CM | POA: Diagnosis not present

## 2022-04-19 DIAGNOSIS — G894 Chronic pain syndrome: Secondary | ICD-10-CM

## 2022-04-19 DIAGNOSIS — M461 Sacroiliitis, not elsewhere classified: Secondary | ICD-10-CM | POA: Diagnosis not present

## 2022-04-19 DIAGNOSIS — R5383 Other fatigue: Secondary | ICD-10-CM | POA: Diagnosis not present

## 2022-04-19 DIAGNOSIS — R9389 Abnormal findings on diagnostic imaging of other specified body structures: Secondary | ICD-10-CM

## 2022-04-19 DIAGNOSIS — M2550 Pain in unspecified joint: Secondary | ICD-10-CM | POA: Diagnosis not present

## 2022-04-19 DIAGNOSIS — J4550 Severe persistent asthma, uncomplicated: Secondary | ICD-10-CM | POA: Diagnosis not present

## 2022-04-19 DIAGNOSIS — M899 Disorder of bone, unspecified: Secondary | ICD-10-CM | POA: Diagnosis not present

## 2022-04-19 DIAGNOSIS — R768 Other specified abnormal immunological findings in serum: Secondary | ICD-10-CM | POA: Diagnosis not present

## 2022-04-19 DIAGNOSIS — M1612 Unilateral primary osteoarthritis, left hip: Secondary | ICD-10-CM

## 2022-04-19 DIAGNOSIS — M456 Ankylosing spondylitis lumbar region: Secondary | ICD-10-CM | POA: Diagnosis not present

## 2022-04-19 LAB — URIC ACID: Uric Acid, Serum: 6.1 mg/dL (ref 4.0–7.8)

## 2022-04-19 LAB — SEDIMENTATION RATE: Sed Rate: 30 mm/hr — ABNORMAL HIGH (ref 0–20)

## 2022-04-19 MED ORDER — GABAPENTIN 100 MG PO CAPS: 200.0000 mg | ORAL_CAPSULE | Freq: Every day | ORAL | 0 refills | Status: DC

## 2022-04-19 NOTE — Assessment & Plan Note (Signed)
Need to get BMI under 40.  Referred to metabolic medicine

## 2022-04-19 NOTE — Patient Instructions (Addendum)
Gabapentin '200mg'$  Referral to metabolic medicine See you again in 7-8 weeks
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
## 2022-04-19 NOTE — Patient Instructions (Signed)
Follow up in 6 months 

## 2022-04-19 NOTE — Assessment & Plan Note (Signed)
Patient on CT scan does have significant arthritic changes of the left hip noted.  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  We discussed with patient that this I do think he would be a candidate for hip replacement at this time.  Was considering this he states previously but now he has gained too much weight.  BMI is 44.  Will be under 40.  Patient is already discussed this with Dr. Berenice Primas previously and will refer will get closer on the weight.  Due to the weight though we will send patient to metabolic medicine that I think will be beneficial.  Follow-up with me again in 8 weeks otherwise.  Started gabapentin for some of the chronic pain he is having as well.

## 2022-04-24 DIAGNOSIS — M9903 Segmental and somatic dysfunction of lumbar region: Secondary | ICD-10-CM | POA: Diagnosis not present

## 2022-04-24 DIAGNOSIS — M5136 Other intervertebral disc degeneration, lumbar region: Secondary | ICD-10-CM | POA: Diagnosis not present

## 2022-04-24 DIAGNOSIS — M4306 Spondylolysis, lumbar region: Secondary | ICD-10-CM | POA: Diagnosis not present

## 2022-04-24 DIAGNOSIS — M9905 Segmental and somatic dysfunction of pelvic region: Secondary | ICD-10-CM | POA: Diagnosis not present

## 2022-04-24 LAB — ANA: Anti Nuclear Antibody (ANA): NEGATIVE

## 2022-04-24 LAB — ANGIOTENSIN CONVERTING ENZYME: Angiotensin-Converting Enzyme: 45 U/L (ref 9–67)

## 2022-04-24 LAB — HLA-B27 ANTIGEN: HLA-B27 Antigen: NEGATIVE

## 2022-04-25 ENCOUNTER — Telehealth: Payer: Self-pay | Admitting: Family Medicine

## 2022-04-25 NOTE — Telephone Encounter (Signed)
Patient called to follow up on lab results that were done on 04/19/2022.  Please advise.

## 2022-04-25 NOTE — Telephone Encounter (Signed)
Spoke with patient per results.  

## 2022-04-26 DIAGNOSIS — F4323 Adjustment disorder with mixed anxiety and depressed mood: Secondary | ICD-10-CM | POA: Diagnosis not present

## 2022-04-26 NOTE — Progress Notes (Signed)
Attending Physician's Attestation   I have reviewed the chart.   I agree with the Advanced Practitioner's note, impression, and recommendations with any updates as below. Agree with upper GI evaluation as outlined.  Appreciate Dr. Lorenso Courier availability for procedures.   Justice Britain, MD [facility] Office # [phone]

## 2022-04-27 ENCOUNTER — Telehealth: Payer: Self-pay | Admitting: *Deleted

## 2022-04-27 ENCOUNTER — Other Ambulatory Visit: Payer: Self-pay

## 2022-04-27 DIAGNOSIS — R1032 Left lower quadrant pain: Secondary | ICD-10-CM

## 2022-04-27 DIAGNOSIS — R110 Nausea: Secondary | ICD-10-CM

## 2022-04-27 DIAGNOSIS — Z800 Family history of malignant neoplasm of digestive organs: Secondary | ICD-10-CM

## 2022-04-27 NOTE — Telephone Encounter (Signed)
EGD has been scheduled for 05/15/22 at 9 am at [facility] with GM

## 2022-04-27 NOTE — Telephone Encounter (Signed)
Dr. Lorenso Courier,   This pt is a documented difficult intubation and his procedure will need to be done at the hospital.    Thanks,  Osvaldo Angst

## 2022-04-27 NOTE — Telephone Encounter (Signed)
CD, Thank you for being potentially available for this procedure.  [name] needs to be done in the hospital, we will work on trying to get him set up for May.  Patty, Please move forward with placing this patient on the open blocks that we discussed in March and go ahead and cancel his LHC procedure with Dr. Lorenso Courier. Thanks. GM

## 2022-04-27 NOTE — Telephone Encounter (Signed)
EGD scheduled, pt instructed and medications reviewed.  Patient instructions mailed to home.  Patient to call with any questions or concerns.  

## 2022-05-02 ENCOUNTER — Encounter: Payer: Medicare Other | Admitting: Internal Medicine

## 2022-05-04 ENCOUNTER — Other Ambulatory Visit: Payer: Self-pay | Admitting: Nurse Practitioner

## 2022-05-04 DIAGNOSIS — R059 Cough, unspecified: Secondary | ICD-10-CM

## 2022-05-07 ENCOUNTER — Telehealth: Payer: Self-pay | Admitting: Pulmonary Disease

## 2022-05-07 MED ORDER — BENZONATATE 200 MG PO CAPS: 200.0000 mg | ORAL_CAPSULE | Freq: Three times a day (TID) | ORAL | 1 refills | Status: DC | PRN

## 2022-05-07 NOTE — Telephone Encounter (Signed)
Pls call pt for refill of his BEN----something.  (cough supressant capsule)  Pharm is [facility] in [place]  [phone] is His #

## 2022-05-07 NOTE — Telephone Encounter (Signed)
I have sent refill for benzonatate.

## 2022-05-07 NOTE — Telephone Encounter (Signed)
Called and spoke with pt. Pt is requesting to have a refill of his benzonatate '200mg'$  to help with his cough. Last time med was prescribed was 12/07/21 but it is no longer on pt's med list.  Dr. Halford Chessman, please advise if you are okay with [place] sending a new prescription of this to the pharmacy for pt.

## 2022-05-08 DIAGNOSIS — M4306 Spondylolysis, lumbar region: Secondary | ICD-10-CM | POA: Diagnosis not present

## 2022-05-08 DIAGNOSIS — M9903 Segmental and somatic dysfunction of lumbar region: Secondary | ICD-10-CM | POA: Diagnosis not present

## 2022-05-08 DIAGNOSIS — M5136 Other intervertebral disc degeneration, lumbar region: Secondary | ICD-10-CM | POA: Diagnosis not present

## 2022-05-08 DIAGNOSIS — M9905 Segmental and somatic dysfunction of pelvic region: Secondary | ICD-10-CM | POA: Diagnosis not present

## 2022-05-08 NOTE — Telephone Encounter (Signed)
Called and spoke with pt letting him know that the Rx for his benzonatate was sent to pharmacy for him and he verbalized understanding. Nothing further needed.

## 2022-05-09 ENCOUNTER — Encounter (HOSPITAL_COMMUNITY): Payer: Self-pay | Admitting: Gastroenterology

## 2022-05-09 NOTE — Progress Notes (Signed)
Attempted to obtain medical history via telephone, unable to reach at this time. HIPAA compliant voicemail message left requesting return call to pre surgical testing department. 

## 2022-05-15 ENCOUNTER — Other Ambulatory Visit: Payer: Self-pay

## 2022-05-15 ENCOUNTER — Encounter (HOSPITAL_COMMUNITY): Payer: Self-pay | Admitting: Gastroenterology

## 2022-05-15 ENCOUNTER — Ambulatory Visit (HOSPITAL_COMMUNITY)
Admission: RE | Admit: 2022-05-15 | Discharge: 2022-05-15 | Disposition: A | Discharge status: Home / Self Care | Payer: Medicare Other | Attending: Gastroenterology | Admitting: Gastroenterology

## 2022-05-15 ENCOUNTER — Other Ambulatory Visit: Payer: Self-pay | Admitting: Gastroenterology

## 2022-05-15 ENCOUNTER — Ambulatory Visit (HOSPITAL_BASED_OUTPATIENT_CLINIC_OR_DEPARTMENT_OTHER): Payer: Medicare Other | Admitting: Anesthesiology

## 2022-05-15 ENCOUNTER — Ambulatory Visit (HOSPITAL_COMMUNITY): Payer: Medicare Other | Admitting: Anesthesiology

## 2022-05-15 ENCOUNTER — Encounter (HOSPITAL_COMMUNITY)
Admission: RE | Disposition: A | Discharge status: Home / Self Care | Payer: Self-pay | Source: Home / Self Care | Attending: Gastroenterology

## 2022-05-15 DIAGNOSIS — K319 Disease of stomach and duodenum, unspecified: Secondary | ICD-10-CM | POA: Diagnosis not present

## 2022-05-15 DIAGNOSIS — M25552 Pain in left hip: Secondary | ICD-10-CM | POA: Insufficient documentation

## 2022-05-15 DIAGNOSIS — K2289 Other specified disease of esophagus: Secondary | ICD-10-CM | POA: Insufficient documentation

## 2022-05-15 DIAGNOSIS — Z800 Family history of malignant neoplasm of digestive organs: Secondary | ICD-10-CM | POA: Diagnosis not present

## 2022-05-15 DIAGNOSIS — K209 Esophagitis, unspecified without bleeding: Secondary | ICD-10-CM | POA: Insufficient documentation

## 2022-05-15 DIAGNOSIS — J45909 Unspecified asthma, uncomplicated: Secondary | ICD-10-CM | POA: Insufficient documentation

## 2022-05-15 DIAGNOSIS — R1032 Left lower quadrant pain: Secondary | ICD-10-CM | POA: Diagnosis not present

## 2022-05-15 DIAGNOSIS — K3189 Other diseases of stomach and duodenum: Secondary | ICD-10-CM | POA: Insufficient documentation

## 2022-05-15 DIAGNOSIS — K2990 Gastroduodenitis, unspecified, without bleeding: Secondary | ICD-10-CM | POA: Diagnosis not present

## 2022-05-15 DIAGNOSIS — K2100 Gastro-esophageal reflux disease with esophagitis, without bleeding: Secondary | ICD-10-CM | POA: Diagnosis not present

## 2022-05-15 DIAGNOSIS — Z83719 Family history of colon polyps, unspecified: Secondary | ICD-10-CM | POA: Insufficient documentation

## 2022-05-15 DIAGNOSIS — G4730 Sleep apnea, unspecified: Secondary | ICD-10-CM | POA: Insufficient documentation

## 2022-05-15 DIAGNOSIS — Z6841 Body Mass Index (BMI) 40.0 and over, adult: Secondary | ICD-10-CM | POA: Insufficient documentation

## 2022-05-15 DIAGNOSIS — F418 Other specified anxiety disorders: Secondary | ICD-10-CM | POA: Diagnosis not present

## 2022-05-15 DIAGNOSIS — K2950 Unspecified chronic gastritis without bleeding: Secondary | ICD-10-CM | POA: Diagnosis not present

## 2022-05-15 DIAGNOSIS — K449 Diaphragmatic hernia without obstruction or gangrene: Secondary | ICD-10-CM | POA: Insufficient documentation

## 2022-05-15 DIAGNOSIS — R110 Nausea: Secondary | ICD-10-CM | POA: Diagnosis not present

## 2022-05-15 HISTORY — PX: BIOPSY: SHX5522

## 2022-05-15 HISTORY — PX: ESOPHAGOGASTRODUODENOSCOPY (EGD) WITH PROPOFOL: SHX5813

## 2022-05-15 SURGERY — ESOPHAGOGASTRODUODENOSCOPY (EGD) WITH PROPOFOL
Anesthesia: Monitor Anesthesia Care

## 2022-05-15 MED ORDER — PROPOFOL 500 MG/50ML IV EMUL
INTRAVENOUS | Status: AC
  Filled 2022-05-15: qty 50

## 2022-05-15 MED ORDER — PROPOFOL 500 MG/50ML IV EMUL
INTRAVENOUS | Status: DC | PRN
  Administered 2022-05-15: 125 ug/kg/min via INTRAVENOUS

## 2022-05-15 MED ORDER — PROPOFOL 10 MG/ML IV BOLUS
INTRAVENOUS | Status: DC | PRN
  Administered 2022-05-15: 30 mg via INTRAVENOUS
  Administered 2022-05-15: 20 mg via INTRAVENOUS

## 2022-05-15 MED ORDER — SODIUM CHLORIDE 0.9 % IV SOLN: INTRAVENOUS | Status: DC

## 2022-05-15 MED ORDER — ONDANSETRON 8 MG PO TBDP: 8.0000 mg | ORAL_TABLET | Freq: Three times a day (TID) | ORAL | 1 refills | Status: DC | PRN

## 2022-05-15 MED ORDER — RABEPRAZOLE SODIUM 20 MG PO TBEC: 20.0000 mg | DELAYED_RELEASE_TABLET | Freq: Two times a day (BID) | ORAL | 12 refills | Status: DC

## 2022-05-15 MED ORDER — LACTATED RINGERS IV SOLN: INTRAVENOUS | Status: DC

## 2022-05-15 SURGICAL SUPPLY — 15 items

## 2022-05-15 NOTE — Anesthesia Preprocedure Evaluation (Signed)
Anesthesia Evaluation  Patient identified by MRN, date of birth, ID band Patient awake    Reviewed: Allergy & Precautions, NPO status , Patient's Chart, lab work & pertinent test results  Airway Mallampati: II  TM Distance: >3 FB Neck ROM: Full    Dental  (+) Teeth Intact, Dental Advisory Given   Pulmonary asthma , sleep apnea and Continuous Positive Airway Pressure Ventilation    breath sounds clear to auscultation       Cardiovascular + dysrhythmias  Rhythm:Regular Rate:Normal     Neuro/Psych  Headaches PSYCHIATRIC DISORDERS Anxiety Depression       GI/Hepatic Neg liver ROS,GERD  ,,  Endo/Other  negative endocrine ROS    Renal/GU negative Renal ROS     Musculoskeletal  (+) Arthritis ,    Abdominal   Peds  Hematology negative hematology ROS (+)   Anesthesia Other Findings   Reproductive/Obstetrics                             Anesthesia Physical Anesthesia Plan  ASA: 3  Anesthesia Plan: MAC   Post-op Pain Management: Minimal or no pain anticipated   Induction: Intravenous  PONV Risk Score and Plan: 0 and Propofol infusion  Airway Management Planned: Natural Airway and Simple Face Mask  Additional Equipment: None  Intra-op Plan:   Post-operative Plan:   Informed Consent: I have reviewed the patients History and Physical, chart, labs and discussed the procedure including the risks, benefits and alternatives for the proposed anesthesia with the patient or authorized representative who has indicated his/her understanding and acceptance.       Plan Discussed with: CRNA  Anesthesia Plan Comments:        Anesthesia Quick Evaluation

## 2022-05-15 NOTE — Op Note (Signed)
[facility] Patient Name: [name] Procedure Date: 05/15/2022 MRN: [id number] Attending MD: Justice Britain , MD, [id number] Date of Birth: 1965-10-17 CSN: [id number] Age: 57 Admit Type: Outpatient Procedure:                Upper GI endoscopy Indications:              Nausea, Family history of gastric cancer Providers:                Justice Britain, MD, Dulcy Fanny, Luan Moore, Technician, Charlack Zenia Resides CRNA, CRNA Referring MD:             Lavone Nian Gordan Payment Medicines:                Monitored Anesthesia Care Complications:            No immediate complications. Estimated Blood Loss:     Estimated blood loss was minimal. Procedure:                Pre-Anesthesia Assessment:                           - Prior to the procedure, a History and Physical                            was performed, and patient medications and                            allergies were reviewed. The patient's tolerance of                            previous anesthesia was also reviewed. The risks                            and benefits of the procedure and the sedation                            options and risks were discussed with the patient.                            All questions were answered, and informed consent                            was obtained. Prior Anticoagulants: The patient has                            taken no anticoagulant or antiplatelet agents. ASA                            Grade Assessment: III - A patient with severe                            systemic disease. After reviewing the risks and  benefits, the patient was deemed in satisfactory                            condition to undergo the procedure.                           After obtaining informed consent, the endoscope was                            passed under direct vision. Throughout the                             procedure, the patient's blood pressure, pulse, and                            oxygen saturations were monitored continuously. The                            [id number] Olympus endoscope was introduced                            through the mouth, and advanced to the second part                            of duodenum. The upper GI endoscopy was                            accomplished without difficulty. The patient                            tolerated the procedure. Scope In: Scope Out: Findings:      No gross lesions were noted in the proximal esophagus and in the mid       esophagus.      LA Grade B (one or more mucosal breaks greater than 5 mm, not extending       between the tops of two mucosal folds) esophagitis with no bleeding was       found in the distal esophagus.      The Z-line was irregular and was found 38 cm from the incisors.      A 1 cm hiatal hernia was present.      Patchy mildly erythematous mucosa without bleeding was found in the       gastric antrum.      No other gross lesions were noted in the entire examined stomach.       Biopsies were taken with a cold forceps for histology and Helicobacter       pylori testing.      No gross lesions were noted in the duodenal bulb, in the first portion       of the duodenum and in the second portion of the duodenum. Biopsies were       taken with a cold forceps for histology. Impression:               - No gross lesions in the proximal esophagus and in  the mid esophagus. LA Grade B esophagitis with no                            bleeding found in the distal esophagus. Z-line                            irregular, 38 cm from the incisors.                           - 1 cm hiatal hernia.                           - Erythematous mucosa in the antrum. No other gross                            lesions in the entire stomach. Biopsied.                           - No gross lesions in the duodenal  bulb, in the                            first portion of the duodenum and in the second                            portion of the duodenum. Biopsied. Moderate Sedation:      Not Applicable - Patient had care per Anesthesia. Recommendation:           - The patient will be observed post-procedure,                            until all discharge criteria are met.                           - Discharge patient to home.                           - Patient has a contact number available for                            emergencies. The signs and symptoms of potential                            delayed complications were discussed with the                            patient. Return to normal activities tomorrow.                            Written discharge instructions were provided to the                            patient.                           -  Await pathology results.                           - Initiate Aciphex 20 mg twice daily for next 2                            months. May decrease to once daily thereafter.                           - Initiate Zofran 8 mg ODT as needed every 8 hour                            for significant nausea that is not tolerated                            otherwise.                           - Continue present medications.                           - Recommend consideration of repeat upper endoscopy                            in 3 to 4 months to ensure healing of esophagitis                            and reevaluation of irregular Z-line to ensure no                            evidence of Barrett's esophagus.                           - The findings and recommendations were discussed                            with the patient.                           - The findings and recommendations were discussed                            with the designated responsible adult. Procedure Code(s):        --- Professional ---                           [phone],  Esophagogastroduodenoscopy, flexible,                            transoral; with biopsy, single or multiple Diagnosis Code(s):        --- Professional ---                           K20.90, Esophagitis, unspecified without bleeding  K22.89, Other specified disease of esophagus                           K44.9, Diaphragmatic hernia without obstruction or                            gangrene                           K31.89, Other diseases of stomach and duodenum                           R11.0, Nausea                           Z80.0, Family history of malignant neoplasm of                            digestive organs CPT copyright 2022 American [employer]. All rights reserved. The codes documented in this report are preliminary and upon coder review may  be revised to meet current compliance requirements. Justice Britain, MD 05/15/2022 9:50:00 AM Number of Addenda: 0

## 2022-05-15 NOTE — Anesthesia Procedure Notes (Signed)
Procedure Name: MAC Date/Time: 05/15/2022 9:22 AM  Performed by: Lollie Sails, CRNAPre-anesthesia Checklist: Patient identified, Emergency Drugs available, Suction available, Patient being monitored and Timeout performed Oxygen Delivery Method: Simple face mask Preoxygenation: POM used. Placement Confirmation: positive ETCO2

## 2022-05-15 NOTE — Discharge Instructions (Signed)
YOU HAD AN ENDOSCOPIC PROCEDURE TODAY: Refer to the procedure report and other information in the discharge instructions given to you for any specific questions about what was found during the examination. If this information does not answer your questions, please call [place] office at [phone] to clarify.  ° °YOU SHOULD EXPECT: Some feelings of bloating in the abdomen. Passage of more gas than usual. Walking can help get rid of the air that was put into your GI tract during the procedure and reduce the bloating. If you had a lower endoscopy (such as a colonoscopy or flexible sigmoidoscopy) you may notice spotting of blood in your stool or on the toilet paper. Some abdominal soreness may be present for a day or two, also. ° °DIET: Your first meal following the procedure should be a light meal and then it is ok to progress to your normal diet. A half-sandwich or bowl of soup is an example of a good first meal. Heavy or fried foods are harder to digest and may make you feel nauseous or bloated. Drink plenty of fluids but you should avoid alcoholic beverages for 24 hours. If you had a esophageal dilation, please see attached instructions for diet.   ° °ACTIVITY: Your care partner should take you home directly after the procedure. You should plan to take it easy, moving slowly for the rest of the day. You can resume normal activity the day after the procedure however YOU SHOULD NOT DRIVE, use power tools, machinery or perform tasks that involve climbing or major physical exertion for 24 hours (because of the sedation medicines used during the test).  ° °SYMPTOMS TO REPORT IMMEDIATELY: °A gastroenterologist can be reached at any hour. Please call [phone]  for any of the following symptoms:  °Following lower endoscopy (colonoscopy, flexible sigmoidoscopy) °Excessive amounts of blood in the stool  °Significant tenderness, worsening of abdominal pains  °Swelling of the abdomen that is new, acute  °Fever of 100° or  higher  °Following upper endoscopy (EGD, EUS, ERCP, esophageal dilation) °Vomiting of blood or coffee ground material  °New, significant abdominal pain  °New, significant chest pain or pain under the shoulder blades  °Painful or persistently difficult swallowing  °New shortness of breath  °Black, tarry-looking or red, bloody stools ° °FOLLOW UP:  °If any biopsies were taken you will be contacted by phone or by letter within the next 1-3 weeks. Call [phone]  if you have not heard about the biopsies in 3 weeks.  °Please also call with any specific questions about appointments or follow up tests. ° °

## 2022-05-15 NOTE — Interval H&P Note (Signed)
History and Physical Interval Note:  05/15/2022 7:46 AM  [name]  has presented today for surgery, with the diagnosis of LLQ pain, Nausea, fm hx stomach cancer.  The various methods of treatment have been discussed with the patient and family. After consideration of risks, benefits and other options for treatment, the patient has consented to  Procedure(s): ESOPHAGOGASTRODUODENOSCOPY (EGD) WITH PROPOFOL (N/A) as a surgical intervention.  The patient's history has been reviewed, patient examined, no change in status, stable for surgery.  I have reviewed the patient's chart and labs.  Questions were answered to the patient's satisfaction.     [employer]

## 2022-05-15 NOTE — Anesthesia Postprocedure Evaluation (Signed)
Anesthesia Post Note  Patient: [name]  Procedure(s) Performed: ESOPHAGOGASTRODUODENOSCOPY (EGD) WITH PROPOFOL BIOPSY     Patient location during evaluation: PACU Anesthesia Type: MAC Level of consciousness: awake and alert Pain management: pain level controlled Vital Signs Assessment: post-procedure vital signs reviewed and stable Respiratory status: spontaneous breathing, nonlabored ventilation, respiratory function stable and patient connected to nasal cannula oxygen Cardiovascular status: stable and blood pressure returned to baseline Postop Assessment: no apparent nausea or vomiting Anesthetic complications: no  No notable events documented.  Last Vitals:  Vitals:   05/15/22 1010 05/15/22 1020  BP: 117/79 129/69  Pulse: 68 66  Resp: (!) 22 16  Temp:    SpO2: 100% 100%    Last Pain:  Vitals:   05/15/22 1020  TempSrc:   PainSc: 0-No pain                 Effie Berkshire

## 2022-05-15 NOTE — Transfer of Care (Signed)
Immediate Anesthesia Transfer of Care Note  Patient: [name]  Procedure(s) Performed: ESOPHAGOGASTRODUODENOSCOPY (EGD) WITH PROPOFOL BIOPSY  Patient Location: PACU and Endoscopy Unit  Anesthesia Type:MAC  Level of Consciousness: awake and drowsy  Airway & Oxygen Therapy: Patient Spontanous Breathing and Patient connected to face mask oxygen  Post-op Assessment: Report given to RN, Post -op Vital signs reviewed and stable, and BP 87/60.  Post vital signs: Reviewed and stable  Last Vitals:  Vitals Value Taken Time  BP    Temp    Pulse 81 05/15/22 0943  Resp 29 05/15/22 0943  SpO2 100 % 05/15/22 0943  Vitals shown include unvalidated device data.  Last Pain:  Vitals:   05/15/22 0756  TempSrc: Temporal  PainSc: 0-No pain         Complications: No notable events documented.

## 2022-05-16 LAB — SURGICAL PATHOLOGY

## 2022-05-18 ENCOUNTER — Encounter: Payer: Self-pay | Admitting: Gastroenterology

## 2022-05-20 ENCOUNTER — Encounter (HOSPITAL_COMMUNITY): Payer: Self-pay | Admitting: Gastroenterology

## 2022-05-29 DIAGNOSIS — M9905 Segmental and somatic dysfunction of pelvic region: Secondary | ICD-10-CM | POA: Diagnosis not present

## 2022-05-29 DIAGNOSIS — M9903 Segmental and somatic dysfunction of lumbar region: Secondary | ICD-10-CM | POA: Diagnosis not present

## 2022-05-29 DIAGNOSIS — M4306 Spondylolysis, lumbar region: Secondary | ICD-10-CM | POA: Diagnosis not present

## 2022-05-29 DIAGNOSIS — M5136 Other intervertebral disc degeneration, lumbar region: Secondary | ICD-10-CM | POA: Diagnosis not present

## 2022-05-30 DIAGNOSIS — M7121 Synovial cyst of popliteal space [Baker], right knee: Secondary | ICD-10-CM | POA: Diagnosis not present

## 2022-05-31 DIAGNOSIS — F4323 Adjustment disorder with mixed anxiety and depressed mood: Secondary | ICD-10-CM | POA: Diagnosis not present
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
## 2022-06-14 ENCOUNTER — Encounter: Payer: Self-pay | Admitting: Family Medicine

## 2022-06-14 ENCOUNTER — Ambulatory Visit (INDEPENDENT_AMBULATORY_CARE_PROVIDER_SITE_OTHER): Payer: Medicare Other | Admitting: Family Medicine

## 2022-06-14 VITALS — BP 106/72 | HR 61 | Ht 69.0 in | Wt 283.0 lb

## 2022-06-14 DIAGNOSIS — M461 Sacroiliitis, not elsewhere classified: Secondary | ICD-10-CM

## 2022-06-14 DIAGNOSIS — M25552 Pain in left hip: Secondary | ICD-10-CM

## 2022-06-14 DIAGNOSIS — M1612 Unilateral primary osteoarthritis, left hip: Secondary | ICD-10-CM | POA: Diagnosis not present

## 2022-06-14 DIAGNOSIS — M9904 Segmental and somatic dysfunction of sacral region: Secondary | ICD-10-CM | POA: Diagnosis not present

## 2022-06-14 DIAGNOSIS — M9902 Segmental and somatic dysfunction of thoracic region: Secondary | ICD-10-CM

## 2022-06-14 DIAGNOSIS — M545 Low back pain, unspecified: Secondary | ICD-10-CM

## 2022-06-14 DIAGNOSIS — M9903 Segmental and somatic dysfunction of lumbar region: Secondary | ICD-10-CM

## 2022-06-14 NOTE — Assessment & Plan Note (Signed)
Discussed with patient again at length.  Do feel that the hip arthritis is likely contributing to a lot of the back pain as well.  Encourage patient to continue to lose weight.  BMI is at 66 and once he gets a BMI under 40 encouraged him to be reevaluated by orthopedic surgery.  Congratulated patient on the weight loss already and encouraged him to continue.  Patient wanted to attempt osteopathic manipulation which we did today.  Will see how patient responds.  Follow-up with me again in 6 to 8 weeks

## 2022-06-14 NOTE — Assessment & Plan Note (Signed)
Continue to encourage weight loss at this time.  Patient declined the referral to weight management at this point but will do the aquatic therapy.

## 2022-06-14 NOTE — Patient Instructions (Signed)
SI jt exercises Referral to aquatic PT Keep losing weight See me again in 7-8 weeks

## 2022-06-14 NOTE — Assessment & Plan Note (Signed)
Left-sided with mild ankylosis.  Attempted osteopathic manipulation today.  Discussed icing regimen and home exercises, discussed which activities to do and which ones to avoid, increase activity slowly over the course of next several weeks.  Follow-up with me again 6 to 8 weeks.  HLA-B27 negative.  Will continue to monitor.

## 2022-07-03 DIAGNOSIS — M5136 Other intervertebral disc degeneration, lumbar region: Secondary | ICD-10-CM | POA: Diagnosis not present

## 2022-07-03 DIAGNOSIS — M9903 Segmental and somatic dysfunction of lumbar region: Secondary | ICD-10-CM | POA: Diagnosis not present

## 2022-07-03 DIAGNOSIS — M9905 Segmental and somatic dysfunction of pelvic region: Secondary | ICD-10-CM | POA: Diagnosis not present

## 2022-07-03 DIAGNOSIS — M4306 Spondylolysis, lumbar region: Secondary | ICD-10-CM | POA: Diagnosis not present

## 2022-07-04 ENCOUNTER — Ambulatory Visit: Payer: Medicare Other | Admitting: Physical Therapy

## 2022-07-08 DIAGNOSIS — R42 Dizziness and giddiness: Secondary | ICD-10-CM | POA: Diagnosis not present

## 2022-07-11 ENCOUNTER — Encounter (HOSPITAL_BASED_OUTPATIENT_CLINIC_OR_DEPARTMENT_OTHER): Payer: Self-pay

## 2022-07-11 ENCOUNTER — Other Ambulatory Visit (HOSPITAL_BASED_OUTPATIENT_CLINIC_OR_DEPARTMENT_OTHER): Payer: Self-pay

## 2022-07-11 ENCOUNTER — Other Ambulatory Visit: Payer: Self-pay

## 2022-07-11 ENCOUNTER — Emergency Department (HOSPITAL_BASED_OUTPATIENT_CLINIC_OR_DEPARTMENT_OTHER)
Admission: EM | Admit: 2022-07-11 | Discharge: 2022-07-11 | Disposition: A | Discharge status: Home / Self Care | Payer: Medicare Other | Attending: Emergency Medicine | Admitting: Emergency Medicine

## 2022-07-11 ENCOUNTER — Emergency Department (HOSPITAL_BASED_OUTPATIENT_CLINIC_OR_DEPARTMENT_OTHER): Payer: Medicare Other

## 2022-07-11 DIAGNOSIS — R42 Dizziness and giddiness: Secondary | ICD-10-CM | POA: Diagnosis not present

## 2022-07-11 DIAGNOSIS — R0602 Shortness of breath: Secondary | ICD-10-CM | POA: Diagnosis not present

## 2022-07-11 DIAGNOSIS — R519 Headache, unspecified: Secondary | ICD-10-CM | POA: Insufficient documentation

## 2022-07-11 LAB — BASIC METABOLIC PANEL
Anion gap: 7 (ref 5–15)
BUN: 13 mg/dL (ref 6–20)
CO2: 24 mmol/L (ref 22–32)
Calcium: 8.8 mg/dL — ABNORMAL LOW (ref 8.9–10.3)
Chloride: 106 mmol/L (ref 98–111)
Creatinine, Ser: 0.92 mg/dL (ref 0.61–1.24)
GFR, Estimated: >60 mL/min (ref >60)
Glucose, Bld: 111 mg/dL — ABNORMAL HIGH (ref 70–99)
Potassium: 5.5 mmol/L — ABNORMAL HIGH (ref 3.5–5.1)
Sodium: 137 mmol/L (ref 135–145)

## 2022-07-11 LAB — URINALYSIS, ROUTINE W REFLEX MICROSCOPIC
Bilirubin Urine: NEGATIVE
Glucose, UA: NEGATIVE mg/dL
Hgb urine dipstick: NEGATIVE
Ketones, ur: NEGATIVE mg/dL
Leukocytes,Ua: NEGATIVE
Nitrite: NEGATIVE
Protein, ur: NEGATIVE mg/dL
Specific Gravity, Urine: 1.006 (ref 1.005–1.030)
pH: 6.5 (ref 5.0–8.0)

## 2022-07-11 LAB — TROPONIN I (HIGH SENSITIVITY)
Troponin I (High Sensitivity): 3 ng/L (ref <18)
Troponin I (High Sensitivity): 3 ng/L (ref <18)

## 2022-07-11 LAB — CBC
HCT: 44.5 % (ref 39.0–52.0)
Hemoglobin: 14.8 g/dL (ref 13.0–17.0)
MCH: 28.6 pg (ref 26.0–34.0)
MCHC: 33.3 g/dL (ref 30.0–36.0)
MCV: 86.1 fL (ref 80.0–100.0)
Platelets: 148 10*3/uL — ABNORMAL LOW (ref 150–400)
RBC: 5.17 MIL/uL (ref 4.22–5.81)
RDW: 14.6 % (ref 11.5–15.5)
WBC: 7.8 10*3/uL (ref 4.0–10.5)
nRBC: 0 % (ref 0.0–0.2)

## 2022-07-11 LAB — CBG MONITORING, ED: Glucose-Capillary: 117 mg/dL — ABNORMAL HIGH (ref 70–99)

## 2022-07-11 LAB — POTASSIUM: Potassium: 3.9 mmol/L (ref 3.5–5.1)

## 2022-07-11 MED ORDER — ONDANSETRON 4 MG PO TBDP
4.0000 mg | ORAL_TABLET | Freq: Three times a day (TID) | ORAL | 0 refills | Status: DC | PRN
  Filled 2022-07-11: qty 20, 7d supply, fill #0

## 2022-07-11 MED ORDER — ONDANSETRON HCL 4 MG/2ML IJ SOLN
4.0000 mg | Freq: Once | INTRAMUSCULAR | Status: AC
  Administered 2022-07-11: 4 mg via INTRAVENOUS
  Filled 2022-07-11: qty 2

## 2022-07-11 MED ORDER — DIAZEPAM 5 MG PO TABS
5.0000 mg | ORAL_TABLET | Freq: Three times a day (TID) | ORAL | 0 refills | Status: AC | PRN
  Filled 2022-07-11: qty 15, 5d supply, fill #0

## 2022-07-11 MED ORDER — HYDROMORPHONE HCL 1 MG/ML IJ SOLN
1.0000 mg | Freq: Once | INTRAMUSCULAR | Status: AC
  Administered 2022-07-11: 1 mg via INTRAVENOUS
  Filled 2022-07-11: qty 1

## 2022-07-11 MED ORDER — SODIUM CHLORIDE 0.9 % IV SOLN: Freq: Once | INTRAVENOUS | Status: AC

## 2022-07-11 MED ORDER — IOHEXOL 350 MG/ML SOLN
100.0000 mL | Freq: Once | INTRAVENOUS | Status: AC | PRN
  Administered 2022-07-11: 75 mL via INTRAVENOUS

## 2022-07-11 NOTE — Discharge Instructions (Addendum)
At this time I suspect you have peripheral vertigo.  This causes intense episodes of dizziness and being off balance particularly with movements.  This is treated with medication such as meclizine and Valium if needed.  Also you typically get follow-up with the ear nose throat specialist.  You may try Valium 5 mg every 6-8 hours if needed for vertigo, spinning dizziness.  Try to rest is much as you can for the next 2 days.  Take extra strength Tylenol every 6 hours for headache if needed. Additional diagnostic studies that could be done to further evaluate dizziness is an MRI.  As you need significant sedation or an open MRI, call your doctor as soon as possible to discuss scheduling open MRI. There are certain symptoms for which you need to return to the emergency department immediately in case of stroke.  Return if you have any blurred vision or double vision, confusion, difficulty with speech, reading or weakness or numbness in an arm or leg.  Do not wait to return, return immediately and preferably if possible go to [facility] emergency department.  The sooner you are evaluated and can receive treatment the better your chances are of not having any residual stroke symptoms.

## 2022-07-11 NOTE — ED Notes (Signed)
Pt discharged to home using teachback Method. Discharge instructions have been discussed with patient and/or family members. Pt verbally acknowledges understanding d/c instructions, has been given opportunity for questions to be answered, and endorses comprehension to checkout at registration before leaving.  

## 2022-07-11 NOTE — ED Notes (Signed)
Collected blue top
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
## 2022-07-11 NOTE — ED Notes (Signed)
Patient transported to CT 

## 2022-07-11 NOTE — ED Notes (Signed)
K+ 3.9

## 2022-07-11 NOTE — ED Triage Notes (Addendum)
PT arrives via POV from home. Pt reports dizziness, headache, light sensitivity, and some sob since Sunday. Pt reports he was seen at an UC and was told he had vertigo. He is AxOx4. Pt was given meclizine and prednisone with no relief.

## 2022-07-13 DIAGNOSIS — H8110 Benign paroxysmal vertigo, unspecified ear: Secondary | ICD-10-CM | POA: Diagnosis not present

## 2022-07-13 DIAGNOSIS — Z6841 Body Mass Index (BMI) 40.0 and over, adult: Secondary | ICD-10-CM | POA: Diagnosis not present

## 2022-07-26 DIAGNOSIS — R519 Headache, unspecified: Secondary | ICD-10-CM | POA: Diagnosis not present

## 2022-07-26 DIAGNOSIS — R42 Dizziness and giddiness: Secondary | ICD-10-CM | POA: Diagnosis not present

## 2022-08-01 DIAGNOSIS — R42 Dizziness and giddiness: Secondary | ICD-10-CM | POA: Diagnosis not present
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
## 2022-08-02 ENCOUNTER — Ambulatory Visit: Payer: Medicare Other | Admitting: Family Medicine

## 2022-08-28 DIAGNOSIS — M4306 Spondylolysis, lumbar region: Secondary | ICD-10-CM | POA: Diagnosis not present

## 2022-08-28 DIAGNOSIS — M9903 Segmental and somatic dysfunction of lumbar region: Secondary | ICD-10-CM | POA: Diagnosis not present

## 2022-08-28 DIAGNOSIS — M9905 Segmental and somatic dysfunction of pelvic region: Secondary | ICD-10-CM | POA: Diagnosis not present

## 2022-08-28 DIAGNOSIS — M5136 Other intervertebral disc degeneration, lumbar region: Secondary | ICD-10-CM | POA: Diagnosis not present

## 2022-09-11 DIAGNOSIS — M9903 Segmental and somatic dysfunction of lumbar region: Secondary | ICD-10-CM | POA: Diagnosis not present

## 2022-09-11 DIAGNOSIS — M4306 Spondylolysis, lumbar region: Secondary | ICD-10-CM | POA: Diagnosis not present

## 2022-09-11 DIAGNOSIS — M5136 Other intervertebral disc degeneration, lumbar region: Secondary | ICD-10-CM | POA: Diagnosis not present

## 2022-09-11 DIAGNOSIS — M9905 Segmental and somatic dysfunction of pelvic region: Secondary | ICD-10-CM | POA: Diagnosis not present

## 2022-09-25 DIAGNOSIS — M9903 Segmental and somatic dysfunction of lumbar region: Secondary | ICD-10-CM | POA: Diagnosis not present

## 2022-09-25 DIAGNOSIS — M5136 Other intervertebral disc degeneration, lumbar region: Secondary | ICD-10-CM | POA: Diagnosis not present

## 2022-09-25 DIAGNOSIS — M9905 Segmental and somatic dysfunction of pelvic region: Secondary | ICD-10-CM | POA: Diagnosis not present

## 2022-09-25 DIAGNOSIS — M4306 Spondylolysis, lumbar region: Secondary | ICD-10-CM | POA: Diagnosis not present
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
## 2022-10-09 ENCOUNTER — Telehealth: Payer: Self-pay | Admitting: Gastroenterology

## 2022-10-09 NOTE — Telephone Encounter (Signed)
Inbound call from patient requesting a call back to have hospital endoscopy scheduled that he is due for. Please advise, thank you.

## 2022-10-10 ENCOUNTER — Ambulatory Visit: Payer: Medicare Other | Admitting: Family Medicine

## 2022-10-10 ENCOUNTER — Encounter: Payer: Self-pay | Admitting: Family Medicine

## 2022-10-10 VITALS — BP 108/80 | HR 94 | Ht 69.0 in | Wt 278.0 lb

## 2022-10-10 DIAGNOSIS — M1612 Unilateral primary osteoarthritis, left hip: Secondary | ICD-10-CM

## 2022-10-10 DIAGNOSIS — M9903 Segmental and somatic dysfunction of lumbar region: Secondary | ICD-10-CM | POA: Diagnosis not present

## 2022-10-10 DIAGNOSIS — M9904 Segmental and somatic dysfunction of sacral region: Secondary | ICD-10-CM | POA: Diagnosis not present

## 2022-10-10 DIAGNOSIS — M9902 Segmental and somatic dysfunction of thoracic region: Secondary | ICD-10-CM | POA: Diagnosis not present

## 2022-10-10 DIAGNOSIS — M9908 Segmental and somatic dysfunction of rib cage: Secondary | ICD-10-CM

## 2022-10-10 DIAGNOSIS — M461 Sacroiliitis, not elsewhere classified: Secondary | ICD-10-CM

## 2022-10-10 DIAGNOSIS — M5134 Other intervertebral disc degeneration, thoracic region: Secondary | ICD-10-CM

## 2022-10-10 DIAGNOSIS — M9901 Segmental and somatic dysfunction of cervical region: Secondary | ICD-10-CM | POA: Diagnosis not present

## 2022-10-10 MED ORDER — METHOCARBAMOL 500 MG PO TABS: 500.0000 mg | ORAL_TABLET | Freq: Every evening | ORAL | 0 refills | Status: DC | PRN

## 2022-10-10 NOTE — Assessment & Plan Note (Signed)
Known degenerative disc disease.  Still responding extremely well though to osteopathic manipulation.  Does have the sacroiliitis and will need to continue to watch.  Patient is attempting to continue to try to lose weight on his own accord at the moment.  Him trying to get the BMI under 40 so patient would be a possible candidate for any surgical intervention is necessary for his hip.  Does have fairly significant hip arthritis noted.  Follow-up again in 6 to 8 weeks

## 2022-10-10 NOTE — Patient Instructions (Addendum)
Robaxin 500 mg as needed 30  2 month follow up

## 2022-10-10 NOTE — Assessment & Plan Note (Addendum)
Will continue to monitor.  Likely will need replacement, I am trying to get BMI under 40 and has made good strides.  Did discuss some other diet changes that could be helpful.  Muscle relaxer prescribed as well.  Warned of potential side effects.

## 2022-10-11 ENCOUNTER — Other Ambulatory Visit: Payer: Self-pay | Admitting: Family Medicine

## 2022-10-11 ENCOUNTER — Other Ambulatory Visit: Payer: Self-pay

## 2022-10-11 DIAGNOSIS — M25552 Pain in left hip: Secondary | ICD-10-CM | POA: Diagnosis not present

## 2022-10-11 DIAGNOSIS — K209 Esophagitis, unspecified without bleeding: Secondary | ICD-10-CM

## 2022-10-11 NOTE — Telephone Encounter (Signed)
EGD has been set up for 12/10/22 at 1245 pm with GM at [facility]   Left message on machine to call back

## 2022-10-12 NOTE — Telephone Encounter (Signed)
Left message on machine to call back  

## 2022-10-15 NOTE — Telephone Encounter (Signed)
I have attempted to reach the pt by phone with no response.  Several messages left.  All information mailed to the home.

## 2022-10-15 NOTE — Telephone Encounter (Signed)
Spoke with the pt and all instructions have been given to the pt.  All questions have been answered to the best of my ability. He will let us know if he has any further questions.

## 2022-10-15 NOTE — Telephone Encounter (Signed)
Patient is returning your call.  

## 2022-10-23 ENCOUNTER — Other Ambulatory Visit: Payer: Self-pay | Admitting: Family Medicine

## 2022-10-23 DIAGNOSIS — M9905 Segmental and somatic dysfunction of pelvic region: Secondary | ICD-10-CM | POA: Diagnosis not present

## 2022-10-23 DIAGNOSIS — M5136 Other intervertebral disc degeneration, lumbar region: Secondary | ICD-10-CM | POA: Diagnosis not present

## 2022-10-23 DIAGNOSIS — M9903 Segmental and somatic dysfunction of lumbar region: Secondary | ICD-10-CM | POA: Diagnosis not present

## 2022-10-23 DIAGNOSIS — M4306 Spondylolysis, lumbar region: Secondary | ICD-10-CM | POA: Diagnosis not present

## 2022-10-30 ENCOUNTER — Ambulatory Visit: Payer: Medicare Other | Admitting: Family Medicine

## 2022-10-30 DIAGNOSIS — Z125 Encounter for screening for malignant neoplasm of prostate: Secondary | ICD-10-CM | POA: Diagnosis not present

## 2022-10-30 DIAGNOSIS — M25559 Pain in unspecified hip: Secondary | ICD-10-CM | POA: Diagnosis not present

## 2022-10-30 DIAGNOSIS — Z131 Encounter for screening for diabetes mellitus: Secondary | ICD-10-CM | POA: Diagnosis not present

## 2022-10-30 DIAGNOSIS — Z6841 Body Mass Index (BMI) 40.0 and over, adult: Secondary | ICD-10-CM | POA: Diagnosis not present

## 2022-10-30 DIAGNOSIS — Z713 Dietary counseling and surveillance: Secondary | ICD-10-CM | POA: Diagnosis not present

## 2022-10-30 DIAGNOSIS — Z136 Encounter for screening for cardiovascular disorders: Secondary | ICD-10-CM | POA: Diagnosis not present

## 2022-10-30 DIAGNOSIS — J45909 Unspecified asthma, uncomplicated: Secondary | ICD-10-CM | POA: Diagnosis not present

## 2022-10-30 DIAGNOSIS — Z87898 Personal history of other specified conditions: Secondary | ICD-10-CM | POA: Diagnosis not present

## 2022-11-01 DIAGNOSIS — M5136 Other intervertebral disc degeneration, lumbar region: Secondary | ICD-10-CM | POA: Diagnosis not present

## 2022-11-01 DIAGNOSIS — M9905 Segmental and somatic dysfunction of pelvic region: Secondary | ICD-10-CM | POA: Diagnosis not present

## 2022-11-01 DIAGNOSIS — M9903 Segmental and somatic dysfunction of lumbar region: Secondary | ICD-10-CM | POA: Diagnosis not present

## 2022-11-01 DIAGNOSIS — M4306 Spondylolysis, lumbar region: Secondary | ICD-10-CM | POA: Diagnosis not present
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
## 2022-11-02 ENCOUNTER — Ambulatory Visit (INDEPENDENT_AMBULATORY_CARE_PROVIDER_SITE_OTHER): Payer: Medicare Other | Admitting: Family Medicine

## 2022-11-02 ENCOUNTER — Other Ambulatory Visit: Payer: Self-pay

## 2022-11-02 ENCOUNTER — Encounter: Payer: Self-pay | Admitting: Family Medicine

## 2022-11-02 VITALS — BP 114/74 | HR 87 | Ht 69.0 in | Wt 288.0 lb

## 2022-11-02 DIAGNOSIS — G8929 Other chronic pain: Secondary | ICD-10-CM

## 2022-11-02 DIAGNOSIS — M461 Sacroiliitis, not elsewhere classified: Secondary | ICD-10-CM

## 2022-11-02 DIAGNOSIS — M67461 Ganglion, right knee: Secondary | ICD-10-CM | POA: Diagnosis not present

## 2022-11-02 DIAGNOSIS — M25561 Pain in right knee: Secondary | ICD-10-CM

## 2022-11-02 DIAGNOSIS — M1612 Unilateral primary osteoarthritis, left hip: Secondary | ICD-10-CM | POA: Diagnosis not present

## 2022-11-02 NOTE — Assessment & Plan Note (Signed)
Continues to have left hip pain as well.  An antalgic gait noted.  Discussed with patient though that needs to be trying to lose weight and get BMI under 40.  Would recommend the potential for surgical evaluation.

## 2022-11-02 NOTE — Patient Instructions (Signed)
Drained Ganglion Cyst See you again in 2 months

## 2022-11-02 NOTE — Assessment & Plan Note (Signed)
Aspiration and ganglion cyst noted.  Patient did respond relatively well.  Had improvement in range of motion almost immediately.  Discussed with patient we did not get the full amount out.  Would need potentially surgical intervention but would be out for some time if he did do this.  We discussed with patient about icing regimen and home exercises otherwise.  Discussed potential compression sleeve.  Follow-up again in 6 to 8 weeks

## 2022-11-02 NOTE — Assessment & Plan Note (Signed)
Responds relatively well to osteopathic manipulation.  Attempted osteopathic manipulation again today.  Discussed posture and ergonomics.  Will continue to work on weight loss.  Follow-up again in 6 to 8 weeks

## 2022-11-06 DIAGNOSIS — M5136 Other intervertebral disc degeneration, lumbar region: Secondary | ICD-10-CM | POA: Diagnosis not present

## 2022-11-06 DIAGNOSIS — M9905 Segmental and somatic dysfunction of pelvic region: Secondary | ICD-10-CM | POA: Diagnosis not present

## 2022-11-06 DIAGNOSIS — M4306 Spondylolysis, lumbar region: Secondary | ICD-10-CM | POA: Diagnosis not present

## 2022-11-06 DIAGNOSIS — M9903 Segmental and somatic dysfunction of lumbar region: Secondary | ICD-10-CM | POA: Diagnosis not present

## 2022-11-13 ENCOUNTER — Telehealth: Payer: Self-pay | Admitting: Pulmonary Disease

## 2022-11-13 DIAGNOSIS — M5136 Other intervertebral disc degeneration, lumbar region: Secondary | ICD-10-CM | POA: Diagnosis not present

## 2022-11-13 DIAGNOSIS — M9903 Segmental and somatic dysfunction of lumbar region: Secondary | ICD-10-CM | POA: Diagnosis not present

## 2022-11-13 DIAGNOSIS — M4306 Spondylolysis, lumbar region: Secondary | ICD-10-CM | POA: Diagnosis not present

## 2022-11-13 DIAGNOSIS — M9905 Segmental and somatic dysfunction of pelvic region: Secondary | ICD-10-CM | POA: Diagnosis not present

## 2022-11-13 NOTE — Telephone Encounter (Signed)
Pt has tested positive for Covid Friday  [id number] - [place], [place] - [address]

## 2022-11-13 NOTE — Telephone Encounter (Signed)
Spoke with patient. He is doing ok. He is a little weak, has a little sinus pressure but feeling better. No fever, he had for a day. No SOB or chest pain. Patient advised to increase fluids and quarantine for 5 days from onset. He states that he just wanted [place] to know he had tested positive.

## 2022-11-13 NOTE — Telephone Encounter (Signed)
He started feeling sick on 11/08/22 after going to the store.  He was feeling weak.  Had a dry cough.  Feels some improvement, but still feels tired.  Not having sputum, wheeze, fever, chest tightness.  He checked his pulse ox while on the phone - SpO2 97% on room air and heart rate 82.  Advised him to continue with conservative measures, and to call back if his symptoms get worse.  Advised him to wait until he recovers from current COVID infection before getting influenza vaccine, and that he can defer COVID booster for several months since he had recent COVID infection.

## 2022-11-20 DIAGNOSIS — M9903 Segmental and somatic dysfunction of lumbar region: Secondary | ICD-10-CM | POA: Diagnosis not present

## 2022-11-20 DIAGNOSIS — M5136 Other intervertebral disc degeneration, lumbar region: Secondary | ICD-10-CM | POA: Diagnosis not present

## 2022-11-20 DIAGNOSIS — M4306 Spondylolysis, lumbar region: Secondary | ICD-10-CM | POA: Diagnosis not present

## 2022-11-20 DIAGNOSIS — M9905 Segmental and somatic dysfunction of pelvic region: Secondary | ICD-10-CM | POA: Diagnosis not present

## 2022-11-28 ENCOUNTER — Ambulatory Visit (HOSPITAL_BASED_OUTPATIENT_CLINIC_OR_DEPARTMENT_OTHER): Payer: Medicare Other | Admitting: Pulmonary Disease

## 2022-11-28 ENCOUNTER — Encounter (HOSPITAL_BASED_OUTPATIENT_CLINIC_OR_DEPARTMENT_OTHER): Payer: Self-pay | Admitting: Pulmonary Disease

## 2022-11-28 VITALS — BP 118/84 | HR 82 | Ht 69.0 in | Wt 289.0 lb

## 2022-11-28 DIAGNOSIS — J4550 Severe persistent asthma, uncomplicated: Secondary | ICD-10-CM

## 2022-11-28 DIAGNOSIS — G4733 Obstructive sleep apnea (adult) (pediatric): Secondary | ICD-10-CM

## 2022-11-28 DIAGNOSIS — Z23 Encounter for immunization: Secondary | ICD-10-CM | POA: Diagnosis not present
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
## 2022-11-28 NOTE — Patient Instructions (Signed)
Flu shot today Follow up in 6 months

## 2022-11-29 DIAGNOSIS — F4323 Adjustment disorder with mixed anxiety and depressed mood: Secondary | ICD-10-CM | POA: Diagnosis not present

## 2022-11-30 ENCOUNTER — Other Ambulatory Visit: Payer: Self-pay

## 2022-11-30 ENCOUNTER — Encounter (HOSPITAL_COMMUNITY): Payer: Self-pay | Admitting: Gastroenterology

## 2022-11-30 NOTE — Progress Notes (Signed)
PCP - Dr. Sigmund Hazel  Cardiologist - [facility] 04-04-22 [facility]- Dr. Jillyn Ledger 04-19-22 epic  PPM/ICD -  Device Orders -  Rep Notified -   Chest x-ray -  EKG - 07-13-22 Stress Test - 2021 ECHO - 2021 Cardiac Cath -  CT coronary-2021  Sleep Study -  CPAP -   Fasting Blood Sugar -  Checks Blood Sugar _____ times a day  Blood Thinner Instructions: Aspirin Instructions:    COVID vaccine -  Activity--Able to complete ADL's without CP or SOB Anesthesia review: Asthma  Patient denies shortness of breath, fever, cough and chest pain at PAT appointment   All instructions explained to the patient, with a verbal understanding of the material. Patient agrees to go over the instructions while at home for a better understanding. Patient also instructed to self quarantine after being tested for COVID-19. The opportunity to ask questions was provided.

## 2022-12-04 DIAGNOSIS — M9905 Segmental and somatic dysfunction of pelvic region: Secondary | ICD-10-CM | POA: Diagnosis not present

## 2022-12-04 DIAGNOSIS — M4306 Spondylolysis, lumbar region: Secondary | ICD-10-CM | POA: Diagnosis not present

## 2022-12-04 DIAGNOSIS — M9903 Segmental and somatic dysfunction of lumbar region: Secondary | ICD-10-CM | POA: Diagnosis not present

## 2022-12-04 DIAGNOSIS — M5136 Other intervertebral disc degeneration, lumbar region: Secondary | ICD-10-CM | POA: Diagnosis not present
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
## 2022-12-10 ENCOUNTER — Ambulatory Visit (HOSPITAL_COMMUNITY): Payer: Medicare Other

## 2022-12-10 ENCOUNTER — Ambulatory Visit (HOSPITAL_COMMUNITY)
Admission: RE | Admit: 2022-12-10 | Discharge: 2022-12-10 | Disposition: A | Discharge status: Home / Self Care | Payer: Medicare Other | Source: Ambulatory Visit | Attending: Gastroenterology | Admitting: Gastroenterology

## 2022-12-10 ENCOUNTER — Encounter (HOSPITAL_COMMUNITY)
Admission: RE | Disposition: A | Discharge status: Home / Self Care | Payer: Self-pay | Source: Ambulatory Visit | Attending: Gastroenterology

## 2022-12-10 ENCOUNTER — Encounter (HOSPITAL_COMMUNITY): Payer: Self-pay | Admitting: Gastroenterology

## 2022-12-10 ENCOUNTER — Telehealth: Payer: Self-pay

## 2022-12-10 ENCOUNTER — Other Ambulatory Visit: Payer: Self-pay

## 2022-12-10 DIAGNOSIS — F419 Anxiety disorder, unspecified: Secondary | ICD-10-CM | POA: Insufficient documentation

## 2022-12-10 DIAGNOSIS — Z83719 Family history of colon polyps, unspecified: Secondary | ICD-10-CM | POA: Insufficient documentation

## 2022-12-10 DIAGNOSIS — J45909 Unspecified asthma, uncomplicated: Secondary | ICD-10-CM | POA: Diagnosis not present

## 2022-12-10 DIAGNOSIS — F32A Depression, unspecified: Secondary | ICD-10-CM | POA: Diagnosis not present

## 2022-12-10 DIAGNOSIS — K449 Diaphragmatic hernia without obstruction or gangrene: Secondary | ICD-10-CM | POA: Diagnosis not present

## 2022-12-10 DIAGNOSIS — M1990 Unspecified osteoarthritis, unspecified site: Secondary | ICD-10-CM | POA: Diagnosis not present

## 2022-12-10 DIAGNOSIS — R519 Headache, unspecified: Secondary | ICD-10-CM | POA: Insufficient documentation

## 2022-12-10 DIAGNOSIS — Z6841 Body Mass Index (BMI) 40.0 and over, adult: Secondary | ICD-10-CM | POA: Diagnosis not present

## 2022-12-10 DIAGNOSIS — G4733 Obstructive sleep apnea (adult) (pediatric): Secondary | ICD-10-CM

## 2022-12-10 DIAGNOSIS — R12 Heartburn: Secondary | ICD-10-CM | POA: Diagnosis present

## 2022-12-10 DIAGNOSIS — K209 Esophagitis, unspecified without bleeding: Secondary | ICD-10-CM

## 2022-12-10 DIAGNOSIS — K2289 Other specified disease of esophagus: Secondary | ICD-10-CM | POA: Diagnosis not present

## 2022-12-10 DIAGNOSIS — K2100 Gastro-esophageal reflux disease with esophagitis, without bleeding: Secondary | ICD-10-CM

## 2022-12-10 HISTORY — PX: ESOPHAGOGASTRODUODENOSCOPY (EGD) WITH PROPOFOL: SHX5813

## 2022-12-10 HISTORY — DX: Unspecified osteoarthritis, unspecified site: M19.90

## 2022-12-10 HISTORY — DX: Gastro-esophageal reflux disease without esophagitis: K21.9

## 2022-12-10 SURGERY — ESOPHAGOGASTRODUODENOSCOPY (EGD) WITH PROPOFOL
Anesthesia: Monitor Anesthesia Care

## 2022-12-10 MED ORDER — LACTATED RINGERS IV SOLN: INTRAVENOUS | Status: DC

## 2022-12-10 MED ORDER — PROPOFOL 500 MG/50ML IV EMUL
INTRAVENOUS | Status: DC | PRN
  Administered 2022-12-10: 150 ug/kg/min via INTRAVENOUS

## 2022-12-10 MED ORDER — SODIUM CHLORIDE 0.9 % IV SOLN: INTRAVENOUS | Status: DC

## 2022-12-10 MED ORDER — LIDOCAINE 2% (20 MG/ML) 5 ML SYRINGE
INTRAMUSCULAR | Status: DC | PRN
  Administered 2022-12-10: 100 mg via INTRAVENOUS

## 2022-12-10 MED ORDER — PROPOFOL 1000 MG/100ML IV EMUL
INTRAVENOUS | Status: AC
  Filled 2022-12-10: qty 100

## 2022-12-10 MED ORDER — PROPOFOL 10 MG/ML IV BOLUS
INTRAVENOUS | Status: DC | PRN
Start: 2022-12-10 — End: 2022-12-10
  Administered 2022-12-10: 100 mg via INTRAVENOUS

## 2022-12-10 SURGICAL SUPPLY — 15 items

## 2022-12-10 NOTE — Telephone Encounter (Signed)
3 month recall has been entered as requested

## 2022-12-10 NOTE — Telephone Encounter (Signed)
-----   Message from [facility] sent at 12/10/2022  2:01 PM EDT ----- Regarding: Followup [name], This patient needs a clinic follow-up with me in the next 2 to 3 months. We will discuss transitioning from PPI to PCAB and also can consider referral for TIF or for fundoplication surgically.  Thanks. GM

## 2022-12-10 NOTE — Discharge Instructions (Signed)
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
## 2022-12-10 NOTE — Anesthesia Postprocedure Evaluation (Signed)
Anesthesia Post Note  Patient: [name]  Procedure(s) Performed: ESOPHAGOGASTRODUODENOSCOPY (EGD) WITH PROPOFOL     Patient location during evaluation: PACU Anesthesia Type: MAC Level of consciousness: awake and alert Pain management: pain level controlled Vital Signs Assessment: post-procedure vital signs reviewed and stable Respiratory status: spontaneous breathing, nonlabored ventilation and respiratory function stable Cardiovascular status: blood pressure returned to baseline and stable Postop Assessment: no apparent nausea or vomiting Anesthetic complications: no   No notable events documented.  Last Vitals:  Vitals:   12/10/22 1410 12/10/22 1420  BP: (!) 127/99 132/87  Pulse: 60 60  Resp: 17 15  Temp:    SpO2: 96% 98%    Last Pain:  Vitals:   12/10/22 1420  TempSrc:   PainSc: 0-No pain                 Lannie Fields

## 2022-12-10 NOTE — Transfer of Care (Signed)
Immediate Anesthesia Transfer of Care Note  Patient: [name]  Procedure(s) Performed: ESOPHAGOGASTRODUODENOSCOPY (EGD) WITH PROPOFOL  Patient Location: PACU and Endoscopy Unit  Anesthesia Type:MAC  Level of Consciousness: oriented, drowsy, and patient cooperative  Airway & Oxygen Therapy: Patient Spontanous Breathing and Patient connected to face mask oxygen  Post-op Assessment: Report given to RN and Post -op Vital signs reviewed and stable  Post vital signs: Reviewed and stable  Last Vitals:  Vitals Value Taken Time  BP 123/76 12/10/22 1346  Temp    Pulse 73 12/10/22 1348  Resp 28 12/10/22 1348  SpO2 100 % 12/10/22 1348  Vitals shown include unfiled device data.  Last Pain:  Vitals:   12/10/22 1225  TempSrc: Temporal  PainSc: 0-No pain         Complications: No notable events documented.

## 2022-12-10 NOTE — Anesthesia Preprocedure Evaluation (Addendum)
Anesthesia Evaluation  Patient identified by MRN, date of birth, ID band Patient awake    Reviewed: Allergy & Precautions, NPO status , Patient's Chart, lab work & pertinent test results  History of Anesthesia Complications (+) DIFFICULT AIRWAY, PROLONGED EMERGENCE and history of anesthetic complications (slow to wake up per pt, but did well w/ last EGD in march 2024)  Airway Mallampati: III  TM Distance: >3 FB Neck ROM: Full    Dental  (+) Teeth Intact, Dental Advisory Given   Pulmonary asthma (uses rescue inhaler about once a month) , sleep apnea and Continuous Positive Airway Pressure Ventilation    Pulmonary exam normal breath sounds clear to auscultation       Cardiovascular negative cardio ROS Normal cardiovascular exam Rhythm:Regular Rate:Normal  Echo  1. Left ventricular ejection fraction, by estimation, is 55 to 60%. The  left ventricle has normal function. The left ventricle has no regional  wall motion abnormalities. Left ventricular diastolic parameters are  indeterminate. Elevated left ventricular  end-diastolic pressure.   2. Right ventricular systolic function is normal. The right ventricular  size is normal. Tricuspid regurgitation signal is inadequate for assessing  PA pressure.   3. The mitral valve is normal in structure. No evidence of mitral valve  regurgitation. No evidence of mitral stenosis.   4. The aortic valve is normal in structure. Aortic valve regurgitation is  not visualized. No aortic stenosis is present.   5. Aortic dilatation noted. There is mild dilatation of the aortic root  measuring 37 mm.     Neuro/Psych  Headaches PSYCHIATRIC DISORDERS Anxiety Depression       GI/Hepatic Neg liver ROS,GERD  Controlled and Medicated,,  Endo/Other    Morbid obesityBMI 43  Renal/GU negative Renal ROS  negative genitourinary   Musculoskeletal  (+) Arthritis , Osteoarthritis,    Abdominal  (+) +  obese  Peds  Hematology negative hematology ROS (+)   Anesthesia Other Findings   Reproductive/Obstetrics negative OB ROS                             Anesthesia Physical Anesthesia Plan  ASA: 3  Anesthesia Plan: MAC   Post-op Pain Management:    Induction:   PONV Risk Score and Plan: 2 and Propofol infusion and TIVA  Airway Management Planned: Natural Airway and Simple Face Mask  Additional Equipment: None  Intra-op Plan:   Post-operative Plan:   Informed Consent: I have reviewed the patients History and Physical, chart, labs and discussed the procedure including the risks, benefits and alternatives for the proposed anesthesia with the patient or authorized representative who has indicated his/her understanding and acceptance.       Plan Discussed with: CRNA  Anesthesia Plan Comments: (Airway note from 2013: "Future Recommendations: Recommend- awake intubation Comments: Easy mask airway, DLx1 with mac 3 unable to see beyond epiglottis. DL x2 with Miller3, unable to lift enough to see glottic opening. Dr. Tiffany Kocher DLx3 with Hyacinth Meeker 3 GR 1 view, AOI, +ETCO2, +EBBS."   Glidescope available if needed)        Anesthesia Quick Evaluation

## 2022-12-10 NOTE — Op Note (Signed)
[facility] Patient Name: [name] Procedure Date: 12/10/2022 MRN: [id number] Attending MD: Corliss Parish , MD, [id number] Date of Birth: 11/17/1965 CSN: [id number] Age: 57 Admit Type: Outpatient Procedure:                Upper GI endoscopy Indications:              Heartburn, Follow-up of gastro-esophageal reflux                            disease Providers:                Corliss Parish, MD, Norman Clay, RN, Jacquelyn                            "Jaci" Clelia Croft, RN, Kandice Robinsons, Technician Referring MD:             Sigmund Hazel Medicines:                Monitored Anesthesia Care Complications:            No immediate complications. Estimated Blood Loss:     Estimated blood loss was minimal. Procedure:                Pre-Anesthesia Assessment:                           - Prior to the procedure, a History and Physical                            was performed, and patient medications and                            allergies were reviewed. The patient's tolerance of                            previous anesthesia was also reviewed. The risks                            and benefits of the procedure and the sedation                            options and risks were discussed with the patient.                            All questions were answered, and informed consent                            was obtained. Prior Anticoagulants: The patient has                            taken no anticoagulant or antiplatelet agents. ASA                            Grade Assessment: III - A patient with severe  systemic disease. After reviewing the risks and                            benefits, the patient was deemed in satisfactory                            condition to undergo the procedure.                           After obtaining informed consent, the endoscope was                            passed under direct vision. Throughout the                             procedure, the patient's blood pressure, pulse, and                            oxygen saturations were monitored continuously. The                            GIF-H190 ([id number]) Olympus endoscope was introduced                            through the mouth, and advanced to the second part                            of duodenum. The upper GI endoscopy was                            accomplished without difficulty. The patient                            tolerated the procedure. Findings:      No gross lesions were noted in the majority of the esophagus.      LA Grade A (one or more mucosal breaks less than 5 mm, not extending       between tops of 2 mucosal folds) esophagitis with no bleeding was found       at the gastroesophageal junction.      The Z-line was irregular and was found 39 cm from the incisors.      A 2 cm hiatal hernia was present.      No gross lesions were noted in the entire examined stomach.      No gross lesions were noted in the duodenal bulb, in the first portion       of the duodenum and in the second portion of the duodenum. Impression:               - No gross lesions in the majority of the                            esophagus. LA Grade A esophagitis with no bleeding  found in very distal esophagus (no more than 1 cm                            above the GE junction).                           - Z-line irregular, 39 cm from the incisors.                           - 2 cm hiatal hernia.                           - No gross lesions in the entire stomach.                           - No gross lesions in the duodenal bulb, in the                            first portion of the duodenum and in the second                            portion of the duodenum. Moderate Sedation:      Not Applicable - Patient had care per Anesthesia. Recommendation:           - The patient will be observed post-procedure,                            until all  discharge criteria are met.                           - Discharge patient to home.                           - Patient has a contact number available for                            emergencies. The signs and symptoms of potential                            delayed complications were discussed with the                            patient. Return to normal activities tomorrow.                            Written discharge instructions were provided to the                            patient.                           - Resume previous diet and high fiber diet.                           - Follow an  antireflux regimen.                           - Observe patient's clinical course.                           - Continue present medications.                           - Patient's esophagitis is improved from prior but                            there is still mild grade A esophagitis at the very                            distal esophagus. Not clear that we necessarily                            have to repeat an upper endoscopy as it does not                            look to have clear evidence of underlying Barrett's                            esophagus. Can discuss this further with the                            patient however.                           - Reasonable to consider, if patient wants further                            attempts at coming off of acid reducing medications                            or at least decreasing dosing whether he may be a                            candidate for endoscopic TIF fundoplication versus                            surgical fundoplication and hiatal hernia repair                            concomitantly.                           - The findings and recommendations were discussed                            with the patient.                           - The findings and recommendations were discussed  with the patient's  family. Procedure Code(s):        --- Professional ---                           [id number], Esophagogastroduodenoscopy, flexible,                            transoral; diagnostic, including collection of                            specimen(s) by brushing or washing, when performed                            (separate procedure) Diagnosis Code(s):        --- Professional ---                           K20.90, Esophagitis, unspecified without bleeding                           K22.89, Other specified disease of esophagus                           K44.9, Diaphragmatic hernia without obstruction or                            gangrene                           R12, Heartburn                           K21.9, Gastro-esophageal reflux disease without                            esophagitis CPT copyright 2022 American Medical Association. All rights reserved. The codes documented in this report are preliminary and upon coder review may  be revised to meet current compliance requirements. Corliss Parish, MD 12/10/2022 1:52:46 PM Number of Addenda: 0

## 2022-12-11 ENCOUNTER — Encounter (HOSPITAL_COMMUNITY): Payer: Self-pay | Admitting: Gastroenterology

## 2022-12-11 ENCOUNTER — Ambulatory Visit: Payer: Medicare Other | Admitting: Family Medicine

## 2022-12-11 NOTE — Progress Notes (Signed)
Voicemail full. Unable to leave message

## 2022-12-12 ENCOUNTER — Telehealth: Payer: Self-pay | Admitting: Gastroenterology

## 2022-12-12 NOTE — Telephone Encounter (Addendum)
Patient called requested to speak with a nurse stated he has questions regarding medications( PPI's perhaps) Dr. Meridee Score had spoken with him about.

## 2022-12-12 NOTE — Telephone Encounter (Signed)
Attempted to reach pt by phone no answer and voice mail full

## 2022-12-13 ENCOUNTER — Other Ambulatory Visit: Payer: Self-pay | Admitting: Family Medicine

## 2022-12-13 ENCOUNTER — Ambulatory Visit: Payer: Medicare Other | Admitting: Family Medicine

## 2022-12-13 ENCOUNTER — Encounter: Payer: Self-pay | Admitting: Family Medicine

## 2022-12-13 ENCOUNTER — Ambulatory Visit: Payer: Medicare Other

## 2022-12-13 ENCOUNTER — Other Ambulatory Visit: Payer: Self-pay

## 2022-12-13 VITALS — BP 106/70 | HR 62 | Ht 69.0 in | Wt 289.0 lb

## 2022-12-13 DIAGNOSIS — M25561 Pain in right knee: Secondary | ICD-10-CM | POA: Diagnosis not present

## 2022-12-13 DIAGNOSIS — M7062 Trochanteric bursitis, left hip: Secondary | ICD-10-CM | POA: Diagnosis not present

## 2022-12-13 DIAGNOSIS — M25551 Pain in right hip: Secondary | ICD-10-CM | POA: Diagnosis not present

## 2022-12-13 DIAGNOSIS — M7061 Trochanteric bursitis, right hip: Secondary | ICD-10-CM

## 2022-12-13 DIAGNOSIS — M25552 Pain in left hip: Secondary | ICD-10-CM

## 2022-12-13 DIAGNOSIS — G8929 Other chronic pain: Secondary | ICD-10-CM

## 2022-12-13 DIAGNOSIS — M67461 Ganglion, right knee: Secondary | ICD-10-CM

## 2022-12-13 DIAGNOSIS — M160 Bilateral primary osteoarthritis of hip: Secondary | ICD-10-CM | POA: Diagnosis not present

## 2022-12-13 MED ORDER — GABAPENTIN 100 MG PO CAPS: 200.0000 mg | ORAL_CAPSULE | Freq: Every day | ORAL | 2 refills | Status: DC

## 2022-12-13 NOTE — Telephone Encounter (Signed)
The pt wanted to be put on a cancellation list for office visit.  He has an appt in Jan.  I advised we will call pt if we have any sooner openings.

## 2022-12-13 NOTE — Telephone Encounter (Signed)
Left message on machine to call back  

## 2022-12-13 NOTE — Assessment & Plan Note (Addendum)
Bilateral given today and tolerated the procedure well, discussed icing regimen and home exercises, discussed topical anti-inflammatories, discussed core strengthening.  Differential includes lumbar radiculopathy and will monitor.  Follow-up again in 6 to 8 weeks.  Does have some underlying arthritic changes of the hips but very mild overall.  Continue to work on core strength and weight loss.  Follow-up again in 6 to 8 weeks Refilled gabapentin as well to help him with his sleep and axis there is some lumbar radiculopathy
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
## 2022-12-13 NOTE — Assessment & Plan Note (Signed)
Seem to do significantly well with the aspiration.  Will continue to monitor otherwise.  Does have a history of sacroiliitis and has had further workup on this previously.  Continue to work on core strengthening.  Discussed icing regimen.  Follow-up again in 6 to 8 weeks.

## 2022-12-13 NOTE — Patient Instructions (Signed)
Injection in GT's Do prescribed exercises at least 3x a week  See you again in 2 months

## 2022-12-19 DIAGNOSIS — M4306 Spondylolysis, lumbar region: Secondary | ICD-10-CM | POA: Diagnosis not present

## 2022-12-19 DIAGNOSIS — M9905 Segmental and somatic dysfunction of pelvic region: Secondary | ICD-10-CM | POA: Diagnosis not present

## 2022-12-19 DIAGNOSIS — M5136 Other intervertebral disc degeneration, lumbar region with discogenic back pain only: Secondary | ICD-10-CM | POA: Diagnosis not present

## 2022-12-19 DIAGNOSIS — M9903 Segmental and somatic dysfunction of lumbar region: Secondary | ICD-10-CM | POA: Diagnosis not present

## 2022-12-31 DIAGNOSIS — Z79899 Other long term (current) drug therapy: Secondary | ICD-10-CM | POA: Diagnosis not present

## 2022-12-31 DIAGNOSIS — Z6841 Body Mass Index (BMI) 40.0 and over, adult: Secondary | ICD-10-CM | POA: Diagnosis not present

## 2022-12-31 DIAGNOSIS — R7303 Prediabetes: Secondary | ICD-10-CM | POA: Diagnosis not present

## 2022-12-31 DIAGNOSIS — Z0000 Encounter for general adult medical examination without abnormal findings: Secondary | ICD-10-CM | POA: Diagnosis not present

## 2022-12-31 DIAGNOSIS — G629 Polyneuropathy, unspecified: Secondary | ICD-10-CM | POA: Diagnosis not present

## 2022-12-31 DIAGNOSIS — Z1159 Encounter for screening for other viral diseases: Secondary | ICD-10-CM | POA: Diagnosis not present

## 2023-01-03 DIAGNOSIS — M5136 Other intervertebral disc degeneration, lumbar region with discogenic back pain only: Secondary | ICD-10-CM | POA: Diagnosis not present

## 2023-01-03 DIAGNOSIS — M9905 Segmental and somatic dysfunction of pelvic region: Secondary | ICD-10-CM | POA: Diagnosis not present

## 2023-01-03 DIAGNOSIS — M9903 Segmental and somatic dysfunction of lumbar region: Secondary | ICD-10-CM | POA: Diagnosis not present

## 2023-01-03 DIAGNOSIS — M4306 Spondylolysis, lumbar region: Secondary | ICD-10-CM | POA: Diagnosis not present

## 2023-01-10 ENCOUNTER — Telehealth: Payer: Self-pay | Admitting: Cardiovascular Disease

## 2023-01-10 ENCOUNTER — Other Ambulatory Visit: Payer: Self-pay | Admitting: Family Medicine

## 2023-01-10 DIAGNOSIS — I7122 Aneurysm of the aortic arch, without rupture: Secondary | ICD-10-CM

## 2023-01-10 NOTE — Telephone Encounter (Signed)
Pt states he saw his PCP yesterday and was told he needs a MRV. Please advise. Pt would also like his labs from [facility] reviewed.

## 2023-01-11 ENCOUNTER — Other Ambulatory Visit (HOSPITAL_COMMUNITY): Payer: Self-pay | Admitting: Family Medicine

## 2023-01-11 DIAGNOSIS — I7122 Aneurysm of the aortic arch, without rupture: Secondary | ICD-10-CM

## 2023-01-11 NOTE — Telephone Encounter (Signed)
Called patient back about message. Patient stated his PCP wants him to see his cardiologist and discuss his aorta. 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 Made patient an appointment with Dr. Eden Emms next week to discuss, and see if patient needs MRI or CT. Patient is extremely claustrophobic and would need sedation for any test. Patient is very anxious about his aorta, questioned patient about what test PCP did for this to come up. Patient stated PCP was reviewing CT from 2021. Patient would like Dr. Eden Emms to review PCP notes and lab work. Called Dr. Rondel Baton office and requested notes and lab work. Will send message to Dr. Eden Emms so he is aware.

## 2023-01-14 NOTE — Telephone Encounter (Signed)
Called patient back with Dr. Fabio Bering response.   Per Dr. Eden Emms,  Not sure what issue is He has no aneurysm His cardiac CT showed mildly dilated pulmonary artery and echo was normal with no pulmonary HTN.  I don't think he needs any further scanning    Patient would like [place] to call Dr. Rondel Baton office and find out why she is wanting him to be seen and have an MRI. Called and left message with Dr. Rondel Baton nurse.
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
## 2023-01-15 ENCOUNTER — Ambulatory Visit: Payer: Medicare Other | Attending: Cardiovascular Disease | Admitting: Cardiovascular Disease

## 2023-01-15 ENCOUNTER — Encounter: Payer: Self-pay | Admitting: Cardiovascular Disease

## 2023-01-15 VITALS — BP 110/74 | HR 87 | Resp 16 | Ht 69.0 in | Wt 286.8 lb

## 2023-01-15 DIAGNOSIS — I272 Pulmonary hypertension, unspecified: Secondary | ICD-10-CM | POA: Insufficient documentation

## 2023-01-15 DIAGNOSIS — R079 Chest pain, unspecified: Secondary | ICD-10-CM | POA: Insufficient documentation

## 2023-01-15 DIAGNOSIS — E782 Mixed hyperlipidemia: Secondary | ICD-10-CM | POA: Insufficient documentation

## 2023-01-15 NOTE — Patient Instructions (Signed)
Medication Instructions:  Your physician recommends that you continue on your current medications as directed. Please refer to the Current Medication list given to you today.  *If you need a refill on your cardiac medications before your next appointment, please call your pharmacy*  Lab Work: If you have labs (blood work) drawn today and your tests are completely normal, you will receive your results only by: MyChart Message (if you have MyChart) OR A paper copy in the mail If you have any lab test that is abnormal or we need to change your treatment, we will call you to review the results.  Testing/Procedures: Your physician has requested that you have an echocardiogram. Echocardiography is a painless test that uses sound waves to create images of your heart. It provides your doctor with information about the size and shape of your heart and how well your heart's chambers and valves are working. This procedure takes approximately one hour. There are no restrictions for this procedure. Please do NOT wear cologne, perfume, aftershave, or lotions (deodorant is allowed). Please arrive 15 minutes prior to your appointment time.  Please note: We ask at that you not bring children with you during ultrasound (echo/ vascular) testing. Due to room size and safety concerns, children are not allowed in the ultrasound rooms during exams. Our front office staff cannot provide observation of children in our lobby area while testing is being conducted. An adult accompanying a patient to their appointment will only be allowed in the ultrasound room at the discretion of the ultrasound technician under special circumstances. We apologize for any inconvenience. Follow-Up: At [facility], you and your health needs are our priority.  As part of our continuing mission to provide you with exceptional heart care, we have created designated Provider Care Teams.  These Care Teams include your primary Cardiologist  (physician) and Advanced Practice Providers (APPs -  Physician Assistants and Nurse Practitioners) who all work together to provide you with the care you need, when you need it.  We recommend signing up for the patient portal called "MyChart".  Sign up information is provided on this After Visit Summary.  MyChart is used to connect with patients for Virtual Visits (Telemedicine).  Patients are able to view lab/test results, encounter notes, upcoming appointments, etc.  Non-urgent messages can be sent to your provider as well.   To learn more about what you can do with MyChart, go to [email or web address].    Your next appointment:   As needed   Provider:   Charlton Haws, MD

## 2023-01-16 DIAGNOSIS — M4306 Spondylolysis, lumbar region: Secondary | ICD-10-CM | POA: Diagnosis not present

## 2023-01-16 DIAGNOSIS — M9903 Segmental and somatic dysfunction of lumbar region: Secondary | ICD-10-CM | POA: Diagnosis not present

## 2023-01-16 DIAGNOSIS — M5136 Other intervertebral disc degeneration, lumbar region with discogenic back pain only: Secondary | ICD-10-CM | POA: Diagnosis not present

## 2023-01-16 DIAGNOSIS — M9905 Segmental and somatic dysfunction of pelvic region: Secondary | ICD-10-CM | POA: Diagnosis not present

## 2023-01-22 DIAGNOSIS — M25571 Pain in right ankle and joints of right foot: Secondary | ICD-10-CM | POA: Diagnosis not present

## 2023-01-30 ENCOUNTER — Ambulatory Visit (HOSPITAL_COMMUNITY): Payer: Medicare Other | Attending: Cardiology

## 2023-01-30 DIAGNOSIS — I272 Pulmonary hypertension, unspecified: Secondary | ICD-10-CM | POA: Insufficient documentation

## 2023-01-30 LAB — ECHOCARDIOGRAM COMPLETE
Area-P 1/2: 4.71 cm2
S' Lateral: 3.5 cm

## 2023-01-31 DIAGNOSIS — M9905 Segmental and somatic dysfunction of pelvic region: Secondary | ICD-10-CM | POA: Diagnosis not present

## 2023-01-31 DIAGNOSIS — M5136 Other intervertebral disc degeneration, lumbar region with discogenic back pain only: Secondary | ICD-10-CM | POA: Diagnosis not present

## 2023-01-31 DIAGNOSIS — M4306 Spondylolysis, lumbar region: Secondary | ICD-10-CM | POA: Diagnosis not present

## 2023-01-31 DIAGNOSIS — M9903 Segmental and somatic dysfunction of lumbar region: Secondary | ICD-10-CM | POA: Diagnosis not present

## 2023-02-05 DIAGNOSIS — M25571 Pain in right ankle and joints of right foot: Secondary | ICD-10-CM | POA: Diagnosis not present

## 2023-02-13 DIAGNOSIS — M9905 Segmental and somatic dysfunction of pelvic region: Secondary | ICD-10-CM | POA: Diagnosis not present

## 2023-02-13 DIAGNOSIS — M19071 Primary osteoarthritis, right ankle and foot: Secondary | ICD-10-CM | POA: Diagnosis not present

## 2023-02-13 DIAGNOSIS — M25571 Pain in right ankle and joints of right foot: Secondary | ICD-10-CM | POA: Diagnosis not present

## 2023-02-13 DIAGNOSIS — M9903 Segmental and somatic dysfunction of lumbar region: Secondary | ICD-10-CM | POA: Diagnosis not present

## 2023-02-13 DIAGNOSIS — M4306 Spondylolysis, lumbar region: Secondary | ICD-10-CM | POA: Diagnosis not present

## 2023-02-13 DIAGNOSIS — M5136 Other intervertebral disc degeneration, lumbar region with discogenic back pain only: Secondary | ICD-10-CM | POA: Diagnosis not present
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
## 2023-02-14 ENCOUNTER — Ambulatory Visit: Payer: Medicare Other | Admitting: Family Medicine

## 2023-02-19 ENCOUNTER — Ambulatory Visit: Payer: Medicare Other | Admitting: Family Medicine

## 2023-02-19 ENCOUNTER — Encounter: Payer: Self-pay | Admitting: Family Medicine

## 2023-02-19 ENCOUNTER — Other Ambulatory Visit: Payer: Self-pay

## 2023-02-19 VITALS — BP 110/64 | HR 98 | Ht 69.0 in | Wt 288.0 lb

## 2023-02-19 DIAGNOSIS — M67461 Ganglion, right knee: Secondary | ICD-10-CM

## 2023-02-19 DIAGNOSIS — G894 Chronic pain syndrome: Secondary | ICD-10-CM

## 2023-02-19 DIAGNOSIS — M25561 Pain in right knee: Secondary | ICD-10-CM

## 2023-02-19 MED ORDER — GABAPENTIN 100 MG PO CAPS: 200.0000 mg | ORAL_CAPSULE | Freq: Every day | ORAL | 0 refills | Status: DC

## 2023-02-19 NOTE — Assessment & Plan Note (Signed)
Encouraged weight loss 

## 2023-02-19 NOTE — Patient Instructions (Signed)
Keep watching Baker's cyst Continue gabapentin  See me in 6-8 weeks

## 2023-02-19 NOTE — Assessment & Plan Note (Signed)
Patient was initially having a ganglion cyst.  Starting to have reaccumulation again.  Discussed with patient about what to do next.  Patient has elected to continue to monitor.  Depending on with how this does we can consider the possibility of advanced imaging but would instead consider just surgical intervention with patient having difficulty with MRIs and claustrophobia.  I feel confident enough we are not missing anything else drastically.  Continue to increase activity.  Continue to work on weight loss and would love for patient to have a BMI under 40.  Follow-up with me again in 6 to 8 weeks.  Total time reviewing patient's chart and discussing with patient 31 minutes

## 2023-02-19 NOTE — Assessment & Plan Note (Signed)
Doing somewhat better with the gabapentin.  Helping some of the back and the sacroiliitis.
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
## 2023-02-25 ENCOUNTER — Other Ambulatory Visit (HOSPITAL_COMMUNITY): Payer: Medicare Other

## 2023-02-28 DIAGNOSIS — J351 Hypertrophy of tonsils: Secondary | ICD-10-CM | POA: Diagnosis not present

## 2023-02-28 DIAGNOSIS — J029 Acute pharyngitis, unspecified: Secondary | ICD-10-CM | POA: Diagnosis not present

## 2023-02-28 DIAGNOSIS — K2100 Gastro-esophageal reflux disease with esophagitis, without bleeding: Secondary | ICD-10-CM | POA: Diagnosis not present

## 2023-03-04 ENCOUNTER — Telehealth (HOSPITAL_BASED_OUTPATIENT_CLINIC_OR_DEPARTMENT_OTHER): Payer: Self-pay | Admitting: Pulmonary Disease

## 2023-03-04 NOTE — Telephone Encounter (Signed)
Patient came by in person to fill out a release of information request for Dr. Craige Cotta.   Form has been faxed to medical records with success result. Also has been scanned into the patients registration documents section.

## 2023-03-12 ENCOUNTER — Ambulatory Visit
Admission: RE | Admit: 2023-03-12 | Discharge: 2023-03-12 | Disposition: A | Discharge status: Home / Self Care | Payer: Medicare Other | Source: Ambulatory Visit | Attending: Physician Assistant | Admitting: Physician Assistant

## 2023-03-12 ENCOUNTER — Other Ambulatory Visit (HOSPITAL_COMMUNITY): Payer: Self-pay | Admitting: Physician Assistant

## 2023-03-12 DIAGNOSIS — M9903 Segmental and somatic dysfunction of lumbar region: Secondary | ICD-10-CM | POA: Diagnosis not present

## 2023-03-12 DIAGNOSIS — M9905 Segmental and somatic dysfunction of pelvic region: Secondary | ICD-10-CM | POA: Diagnosis not present

## 2023-03-12 DIAGNOSIS — R070 Pain in throat: Secondary | ICD-10-CM

## 2023-03-12 DIAGNOSIS — J358 Other chronic diseases of tonsils and adenoids: Secondary | ICD-10-CM

## 2023-03-12 DIAGNOSIS — K219 Gastro-esophageal reflux disease without esophagitis: Secondary | ICD-10-CM | POA: Diagnosis not present

## 2023-03-12 DIAGNOSIS — M5136 Other intervertebral disc degeneration, lumbar region with discogenic back pain only: Secondary | ICD-10-CM | POA: Diagnosis not present

## 2023-03-12 DIAGNOSIS — M4306 Spondylolysis, lumbar region: Secondary | ICD-10-CM | POA: Diagnosis not present

## 2023-03-12 DIAGNOSIS — M542 Cervicalgia: Secondary | ICD-10-CM | POA: Diagnosis not present

## 2023-03-12 MED ORDER — IOHEXOL 300 MG/ML  SOLN
75.0000 mL | Freq: Once | INTRAMUSCULAR | Status: AC | PRN
  Administered 2023-03-12: 75 mL via INTRAVENOUS

## 2023-03-12 MED ORDER — SODIUM CHLORIDE 0.9 % IV SOLN: INTRAVENOUS | Status: DC

## 2023-03-19 ENCOUNTER — Ambulatory Visit (INDEPENDENT_AMBULATORY_CARE_PROVIDER_SITE_OTHER): Payer: Medicare Other | Admitting: Gastroenterology

## 2023-03-19 ENCOUNTER — Encounter: Payer: Self-pay | Admitting: Gastroenterology

## 2023-03-19 ENCOUNTER — Other Ambulatory Visit: Payer: Self-pay | Admitting: Family Medicine

## 2023-03-19 VITALS — BP 118/78 | HR 88 | Ht 68.0 in | Wt 295.5 lb

## 2023-03-19 DIAGNOSIS — K2100 Gastro-esophageal reflux disease with esophagitis, without bleeding: Secondary | ICD-10-CM

## 2023-03-19 DIAGNOSIS — Z800 Family history of malignant neoplasm of digestive organs: Secondary | ICD-10-CM | POA: Diagnosis not present

## 2023-03-19 DIAGNOSIS — K449 Diaphragmatic hernia without obstruction or gangrene: Secondary | ICD-10-CM

## 2023-03-19 DIAGNOSIS — R12 Heartburn: Secondary | ICD-10-CM | POA: Insufficient documentation

## 2023-03-19 MED ORDER — DEXLANSOPRAZOLE 30 MG PO CPDR: 30.0000 mg | DELAYED_RELEASE_CAPSULE | Freq: Two times a day (BID) | ORAL | 6 refills | Status: AC
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
## 2023-03-19 NOTE — Patient Instructions (Signed)
 GERD Lifestyle modifications -Eat meals >3 hours before bedtime -Do not lay down or lie flat immediately after eating for at least 2-hours -Do not overeat (decrease portion size and/or eat more frequent smaller meals) -Eat slowly -Wear Loose-fitting clothes -Raise Head of Bed (Head & Chest > Feet with bed blocks) -Avoid foods that trigger the symptoms (Onions, Chocolate, Caffeine, Spicy, and Fatty) -Work on Losing [employer] -Stop Tobacco Use -Avoid Alcohol -Increase Exercise Activity -Maintain Heartburn Diary/Log  We have sent the following medications to your pharmacy for you to pick up at your convenience: Dexilant    Due to recent changes in healthcare laws, you may see the results of your imaging and laboratory studies on MyChart before your provider has had a chance to review them.  We understand that in some cases there may be results that are confusing or concerning to you. Not all laboratory results come back in the same time frame and the provider may be waiting for multiple results in order to interpret others.  Please give us  48 hours in order for your provider to thoroughly review all the results before contacting the office for clarification of your results.   _______________________________________________________  If your blood pressure at your visit was 140/90 or greater, please contact your primary care physician to follow up on this.  _______________________________________________________  If you are age 65 or older, your body mass index should be between 23-30. Your Body mass index is 44.93 kg/m. If this is out of the aforementioned range listed, please consider follow up with your Primary Care Provider.  If you are age 80 or younger, your body mass index should be between 19-25. Your Body mass index is 44.93 kg/m. If this is out of the aformentioned range listed, please consider follow up with your Primary Care Provider.    ________________________________________________________  The [facility] providers would like to encourage you to use MYCHART to communicate with providers for non-urgent requests or questions.  Due to long hold times on the telephone, sending your provider a message by [facility] may be a faster and more efficient way to get a response.  Please allow 48 business hours for a response.  Please remember that this is for non-urgent requests.  _______________________________________________________  Thank you for choosing me and [facility].  Dr. Wilhelmenia

## 2023-03-20 ENCOUNTER — Encounter: Payer: Self-pay | Admitting: Gastroenterology

## 2023-03-20 DIAGNOSIS — Z800 Family history of malignant neoplasm of digestive organs: Secondary | ICD-10-CM | POA: Insufficient documentation

## 2023-03-20 DIAGNOSIS — K449 Diaphragmatic hernia without obstruction or gangrene: Secondary | ICD-10-CM | POA: Insufficient documentation

## 2023-03-23 IMAGING — DX DG CHEST 2V
3 series · 3 of 3 positions shown · non-contrast
Comparison: 10/30/2017

CLINICAL DATA: Shortness of breath.

EXAM:
CHEST - 2 VIEW

[chest pa (1 of 2)]
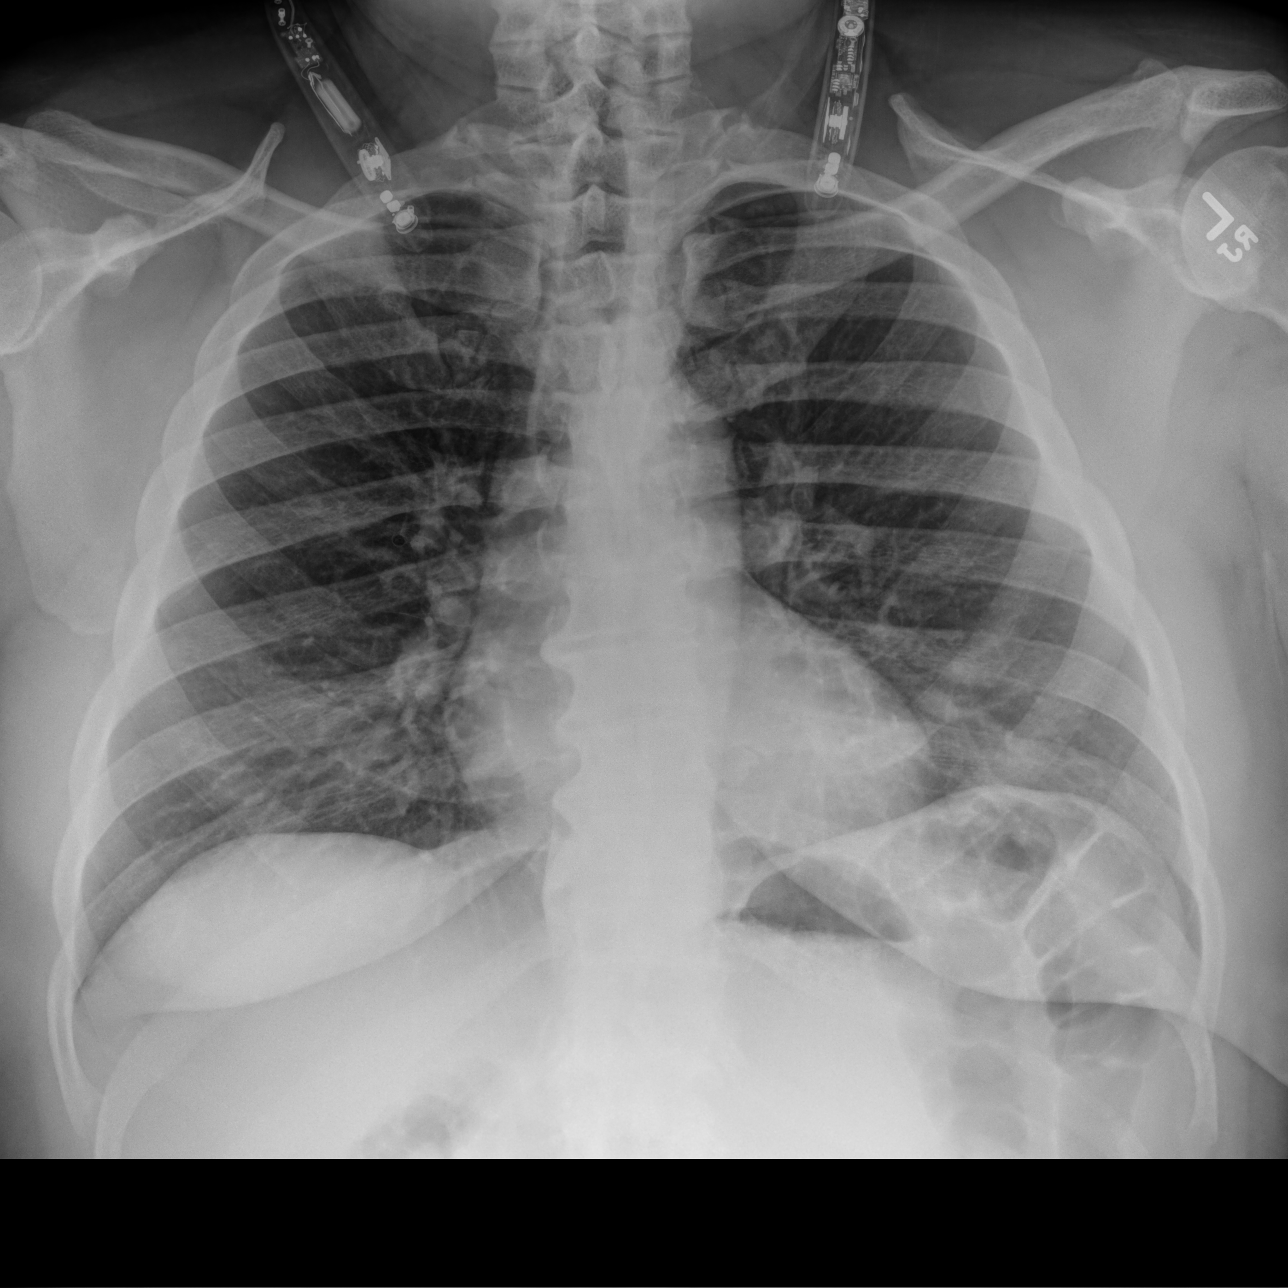

[chest pa (2 of 2)]
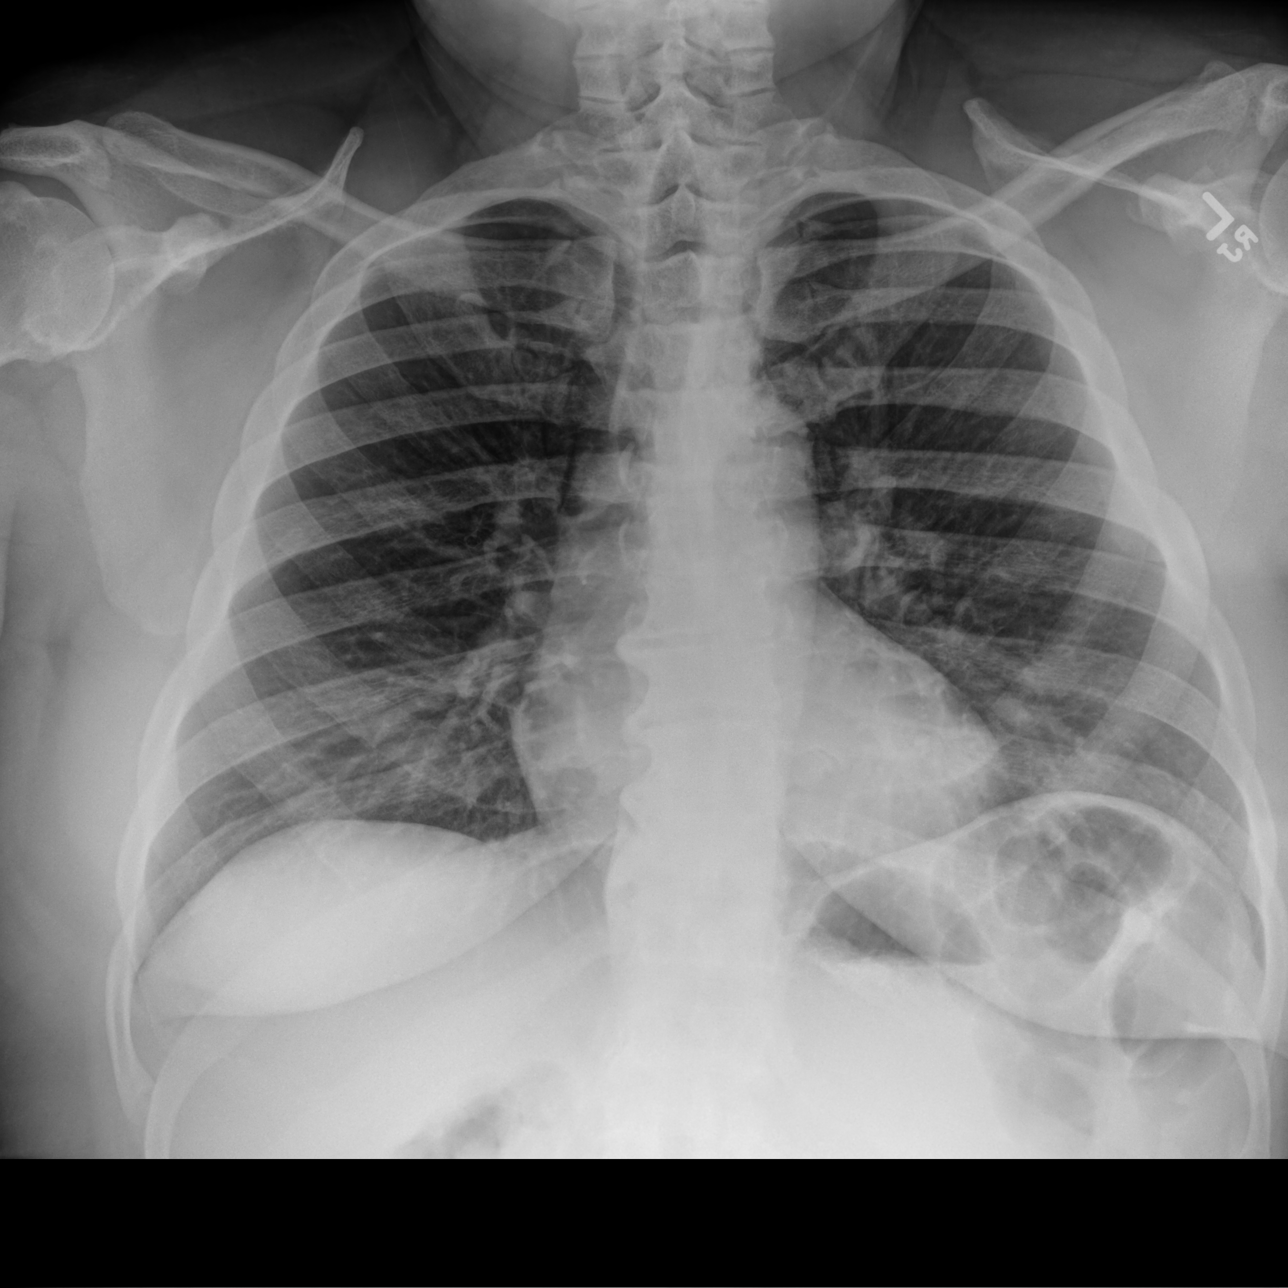

[chest lat]
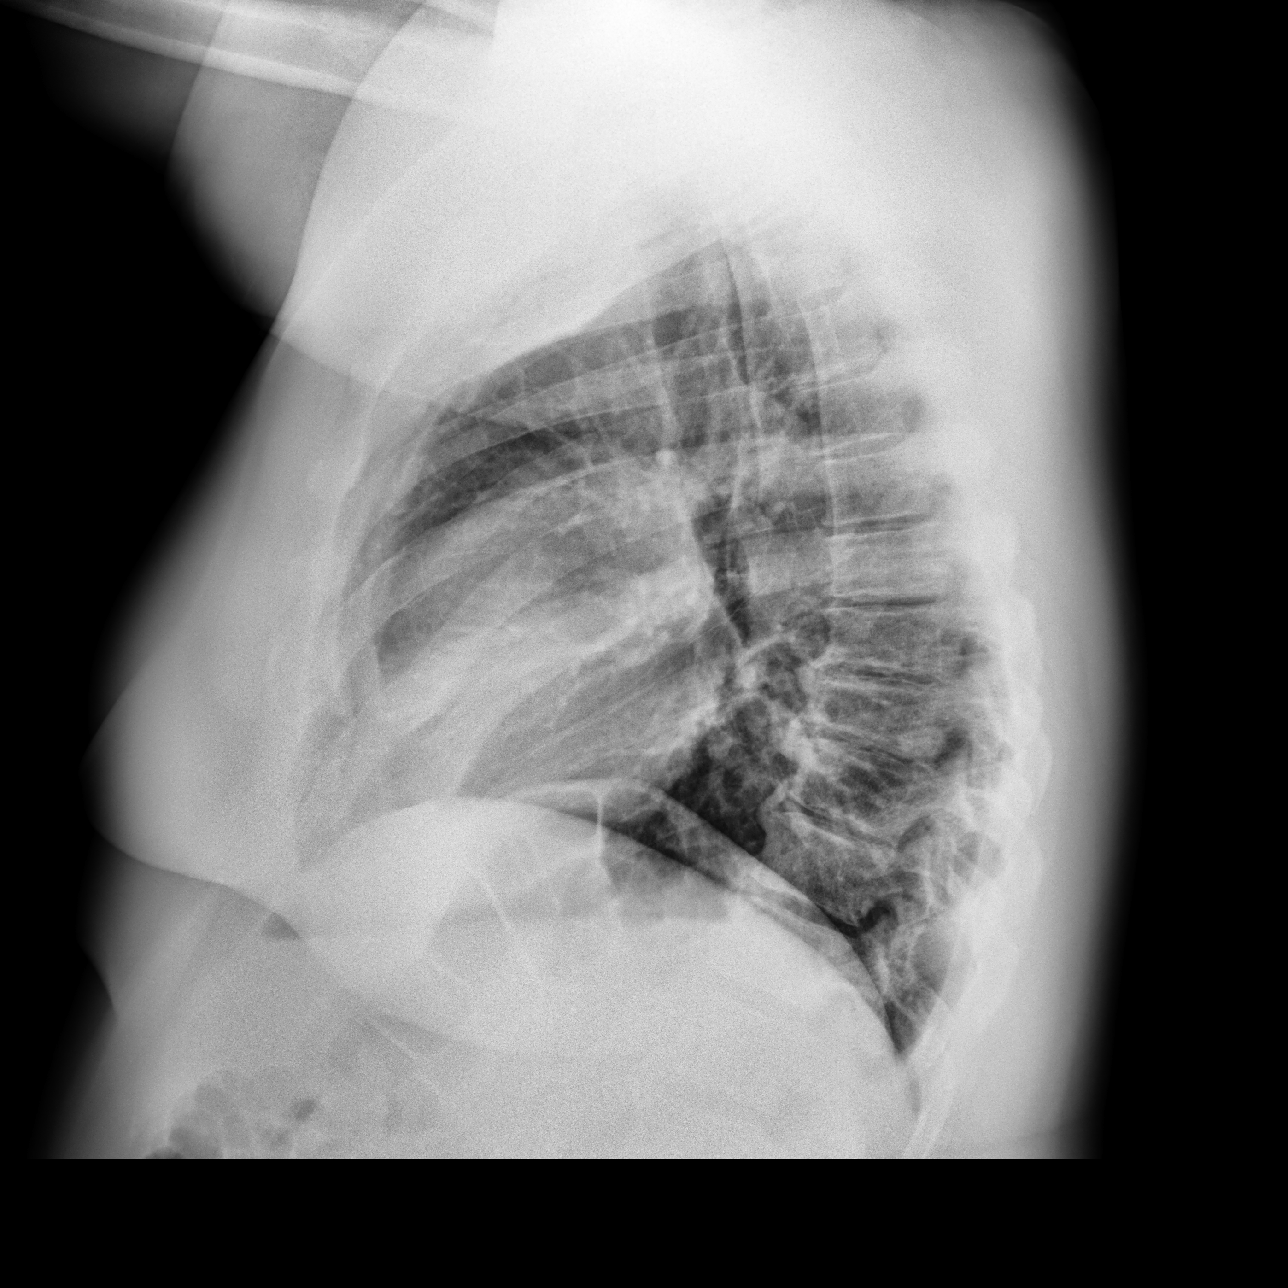

[3 of 3 positions shown; findings below may reference images not displayed]

FINDINGS: Both lungs are clear. Heart and mediastinum are within normal
limits. Multilevel degenerative changes in the thoracic spine. No
large pleural effusions. Trachea is midline.
IMPRESSION: No active cardiopulmonary disease.

## 2023-03-26 DIAGNOSIS — M9903 Segmental and somatic dysfunction of lumbar region: Secondary | ICD-10-CM | POA: Diagnosis not present

## 2023-03-26 DIAGNOSIS — M5136 Other intervertebral disc degeneration, lumbar region with discogenic back pain only: Secondary | ICD-10-CM | POA: Diagnosis not present

## 2023-03-26 DIAGNOSIS — M9905 Segmental and somatic dysfunction of pelvic region: Secondary | ICD-10-CM | POA: Diagnosis not present

## 2023-03-26 DIAGNOSIS — M4306 Spondylolysis, lumbar region: Secondary | ICD-10-CM | POA: Diagnosis not present

## 2023-04-01 ENCOUNTER — Telehealth: Payer: Self-pay

## 2023-04-01 NOTE — Telephone Encounter (Signed)
-----   Message from [facility] sent at 03/20/2023  5:31 AM EST ----- Regarding: Colonoscopy recall [place], Can you place this patient's recall colonoscopy under my name for 2027 from Dr. Christella Hartigan? Thanks. GM

## 2023-04-01 NOTE — Telephone Encounter (Signed)
done

## 2023-04-04 DIAGNOSIS — Z7184 Encounter for health counseling related to travel: Secondary | ICD-10-CM | POA: Diagnosis not present

## 2023-04-04 DIAGNOSIS — Z6841 Body Mass Index (BMI) 40.0 and over, adult: Secondary | ICD-10-CM | POA: Diagnosis not present

## 2023-04-04 DIAGNOSIS — Z713 Dietary counseling and surveillance: Secondary | ICD-10-CM | POA: Diagnosis not present

## 2023-04-04 DIAGNOSIS — F5101 Primary insomnia: Secondary | ICD-10-CM | POA: Diagnosis not present

## 2023-04-04 DIAGNOSIS — G4733 Obstructive sleep apnea (adult) (pediatric): Secondary | ICD-10-CM | POA: Diagnosis not present

## 2023-04-04 DIAGNOSIS — J4540 Moderate persistent asthma, uncomplicated: Secondary | ICD-10-CM | POA: Diagnosis not present
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
## 2023-04-09 ENCOUNTER — Ambulatory Visit (INDEPENDENT_AMBULATORY_CARE_PROVIDER_SITE_OTHER): Payer: Medicare Other | Admitting: Family Medicine

## 2023-04-09 ENCOUNTER — Other Ambulatory Visit: Payer: Self-pay

## 2023-04-09 ENCOUNTER — Encounter: Payer: Self-pay | Admitting: Family Medicine

## 2023-04-09 VITALS — BP 130/60 | HR 77 | Ht 68.0 in | Wt 300.4 lb

## 2023-04-09 DIAGNOSIS — M461 Sacroiliitis, not elsewhere classified: Secondary | ICD-10-CM | POA: Diagnosis not present

## 2023-04-09 DIAGNOSIS — M9902 Segmental and somatic dysfunction of thoracic region: Secondary | ICD-10-CM | POA: Diagnosis not present

## 2023-04-09 DIAGNOSIS — M67461 Ganglion, right knee: Secondary | ICD-10-CM | POA: Diagnosis not present

## 2023-04-09 DIAGNOSIS — M9904 Segmental and somatic dysfunction of sacral region: Secondary | ICD-10-CM | POA: Diagnosis not present

## 2023-04-09 DIAGNOSIS — M9903 Segmental and somatic dysfunction of lumbar region: Secondary | ICD-10-CM

## 2023-04-09 NOTE — Assessment & Plan Note (Signed)
Patient needed aspiration again today.  Tolerated the procedure well.  Hopeful that this will take longer to reaccumulate.  Discussed with patient and potentially consider a compression sleeve.  Has responded well to this previously.  Will be traveling.  Encouraged weight loss.  Follow-up again in 6 to 8 weeks otherwise.  I think she has some

## 2023-04-09 NOTE — Patient Instructions (Addendum)
Drained R knee today.  Last time drained was November 02 2022. Return in 3 months.

## 2023-04-09 NOTE — Assessment & Plan Note (Signed)
History of pain and swelling previously.  Has responded well to manipulation.  Encouraged him to continue to work on.  Patient has unfortunately continued to have weight gain since we have seen him.  Patient will continue to try to increase activity as tolerated.  Follow-up with me again in 6 to 8 weeks otherwise.

## 2023-04-25 DIAGNOSIS — M4306 Spondylolysis, lumbar region: Secondary | ICD-10-CM | POA: Diagnosis not present

## 2023-04-25 DIAGNOSIS — M9905 Segmental and somatic dysfunction of pelvic region: Secondary | ICD-10-CM | POA: Diagnosis not present

## 2023-04-25 DIAGNOSIS — M5136 Other intervertebral disc degeneration, lumbar region with discogenic back pain only: Secondary | ICD-10-CM | POA: Diagnosis not present

## 2023-04-25 DIAGNOSIS — M9903 Segmental and somatic dysfunction of lumbar region: Secondary | ICD-10-CM | POA: Diagnosis not present

## 2023-05-07 DIAGNOSIS — M4306 Spondylolysis, lumbar region: Secondary | ICD-10-CM | POA: Diagnosis not present

## 2023-05-07 DIAGNOSIS — M5136 Other intervertebral disc degeneration, lumbar region with discogenic back pain only: Secondary | ICD-10-CM | POA: Diagnosis not present

## 2023-05-07 DIAGNOSIS — M9905 Segmental and somatic dysfunction of pelvic region: Secondary | ICD-10-CM | POA: Diagnosis not present

## 2023-05-07 DIAGNOSIS — M9903 Segmental and somatic dysfunction of lumbar region: Secondary | ICD-10-CM | POA: Diagnosis not present

## 2023-05-09 ENCOUNTER — Ambulatory Visit (HOSPITAL_COMMUNITY): Admission: RE | Admit: 2023-05-09 | Payer: Medicare Other | Source: Ambulatory Visit

## 2023-05-09 ENCOUNTER — Encounter (HOSPITAL_COMMUNITY): Payer: Self-pay

## 2023-05-09 ENCOUNTER — Ambulatory Visit (HOSPITAL_COMMUNITY): Payer: Medicare Other

## 2023-05-09 ENCOUNTER — Encounter (HOSPITAL_COMMUNITY): Admission: RE | Payer: Self-pay | Source: Ambulatory Visit

## 2023-05-09 SURGERY — MRI WITH ANESTHESIA
Anesthesia: General

## 2023-05-16 DIAGNOSIS — G4733 Obstructive sleep apnea (adult) (pediatric): Secondary | ICD-10-CM | POA: Diagnosis not present

## 2023-05-16 DIAGNOSIS — E669 Obesity, unspecified: Secondary | ICD-10-CM | POA: Diagnosis not present

## 2023-05-16 DIAGNOSIS — G4730 Sleep apnea, unspecified: Secondary | ICD-10-CM | POA: Diagnosis not present
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
## 2023-05-21 ENCOUNTER — Ambulatory Visit: Payer: Medicare Other | Admitting: Family Medicine

## 2023-05-21 ENCOUNTER — Encounter: Payer: Self-pay | Admitting: Family Medicine

## 2023-05-21 VITALS — BP 102/78 | HR 78 | Ht 68.0 in | Wt 287.0 lb

## 2023-05-21 DIAGNOSIS — M9902 Segmental and somatic dysfunction of thoracic region: Secondary | ICD-10-CM

## 2023-05-21 DIAGNOSIS — M9904 Segmental and somatic dysfunction of sacral region: Secondary | ICD-10-CM

## 2023-05-21 DIAGNOSIS — M9903 Segmental and somatic dysfunction of lumbar region: Secondary | ICD-10-CM | POA: Diagnosis not present

## 2023-05-21 DIAGNOSIS — M461 Sacroiliitis, not elsewhere classified: Secondary | ICD-10-CM | POA: Diagnosis not present

## 2023-05-21 NOTE — Patient Instructions (Signed)
 Great to see you Overall doing good Plan next vacation See me in 2months

## 2023-05-21 NOTE — Assessment & Plan Note (Signed)
 Has significant sclerotic changes noted especially of the right sacroiliac joint.  Continue to work on posture and core strength.  Patient is down 13 pounds from previous exam.  Is 2 pounds a week.  Continue to be good otherwise.  Discussed which activities to do and which ones to avoid.  Increase activity slowly over the course of next several weeks.  Discussed icing regimen.  Follow-up with me again in 6

## 2023-05-28 DIAGNOSIS — M5136 Other intervertebral disc degeneration, lumbar region with discogenic back pain only: Secondary | ICD-10-CM | POA: Diagnosis not present

## 2023-05-28 DIAGNOSIS — M9903 Segmental and somatic dysfunction of lumbar region: Secondary | ICD-10-CM | POA: Diagnosis not present

## 2023-05-28 DIAGNOSIS — M4306 Spondylolysis, lumbar region: Secondary | ICD-10-CM | POA: Diagnosis not present

## 2023-05-28 DIAGNOSIS — M9905 Segmental and somatic dysfunction of pelvic region: Secondary | ICD-10-CM | POA: Diagnosis not present

## 2023-05-30 DIAGNOSIS — J358 Other chronic diseases of tonsils and adenoids: Secondary | ICD-10-CM | POA: Diagnosis not present

## 2023-05-30 DIAGNOSIS — R070 Pain in throat: Secondary | ICD-10-CM | POA: Diagnosis not present

## 2023-06-05 DIAGNOSIS — R070 Pain in throat: Secondary | ICD-10-CM | POA: Diagnosis not present

## 2023-06-11 DIAGNOSIS — M9905 Segmental and somatic dysfunction of pelvic region: Secondary | ICD-10-CM | POA: Diagnosis not present

## 2023-06-11 DIAGNOSIS — M5136 Other intervertebral disc degeneration, lumbar region with discogenic back pain only: Secondary | ICD-10-CM | POA: Diagnosis not present

## 2023-06-11 DIAGNOSIS — M4306 Spondylolysis, lumbar region: Secondary | ICD-10-CM | POA: Diagnosis not present

## 2023-06-11 DIAGNOSIS — M9903 Segmental and somatic dysfunction of lumbar region: Secondary | ICD-10-CM | POA: Diagnosis not present

## 2023-06-20 DIAGNOSIS — J4540 Moderate persistent asthma, uncomplicated: Secondary | ICD-10-CM | POA: Diagnosis not present

## 2023-06-20 DIAGNOSIS — G4730 Sleep apnea, unspecified: Secondary | ICD-10-CM | POA: Diagnosis not present

## 2023-06-20 DIAGNOSIS — E669 Obesity, unspecified: Secondary | ICD-10-CM | POA: Diagnosis not present

## 2023-06-20 DIAGNOSIS — G4733 Obstructive sleep apnea (adult) (pediatric): Secondary | ICD-10-CM | POA: Diagnosis not present

## 2023-06-25 DIAGNOSIS — M9905 Segmental and somatic dysfunction of pelvic region: Secondary | ICD-10-CM | POA: Diagnosis not present

## 2023-06-25 DIAGNOSIS — M9903 Segmental and somatic dysfunction of lumbar region: Secondary | ICD-10-CM | POA: Diagnosis not present

## 2023-06-25 DIAGNOSIS — M5136 Other intervertebral disc degeneration, lumbar region with discogenic back pain only: Secondary | ICD-10-CM | POA: Diagnosis not present

## 2023-06-25 DIAGNOSIS — M4306 Spondylolysis, lumbar region: Secondary | ICD-10-CM | POA: Diagnosis not present

## 2023-07-04 ENCOUNTER — Telehealth: Payer: Self-pay | Admitting: Family Medicine

## 2023-07-04 ENCOUNTER — Other Ambulatory Visit: Payer: Self-pay

## 2023-07-04 MED ORDER — GABAPENTIN 100 MG PO CAPS: 200.0000 mg | ORAL_CAPSULE | Freq: Every day | ORAL | 0 refills | Status: DC

## 2023-07-04 NOTE — Telephone Encounter (Signed)
 Med refilled.

## 2023-07-04 NOTE — Telephone Encounter (Signed)
 Pt requesting refill of Gabapentin  to [facility]  [address]. Next appt 07/24/2023.

## 2023-07-10 DIAGNOSIS — M5136 Other intervertebral disc degeneration, lumbar region with discogenic back pain only: Secondary | ICD-10-CM | POA: Diagnosis not present

## 2023-07-10 DIAGNOSIS — M4306 Spondylolysis, lumbar region: Secondary | ICD-10-CM | POA: Diagnosis not present

## 2023-07-10 DIAGNOSIS — M9903 Segmental and somatic dysfunction of lumbar region: Secondary | ICD-10-CM | POA: Diagnosis not present

## 2023-07-10 DIAGNOSIS — M9905 Segmental and somatic dysfunction of pelvic region: Secondary | ICD-10-CM | POA: Diagnosis not present

## 2023-07-11 DIAGNOSIS — M25552 Pain in left hip: Secondary | ICD-10-CM | POA: Diagnosis not present

## 2023-07-11 DIAGNOSIS — M25551 Pain in right hip: Secondary | ICD-10-CM | POA: Diagnosis not present

## 2023-07-18 DIAGNOSIS — Z6841 Body Mass Index (BMI) 40.0 and over, adult: Secondary | ICD-10-CM | POA: Diagnosis not present

## 2023-07-18 DIAGNOSIS — Z01810 Encounter for preprocedural cardiovascular examination: Secondary | ICD-10-CM | POA: Diagnosis not present

## 2023-07-18 DIAGNOSIS — G4733 Obstructive sleep apnea (adult) (pediatric): Secondary | ICD-10-CM | POA: Diagnosis not present

## 2023-07-18 DIAGNOSIS — M1612 Unilateral primary osteoarthritis, left hip: Secondary | ICD-10-CM | POA: Diagnosis not present

## 2023-07-18 DIAGNOSIS — Z01812 Encounter for preprocedural laboratory examination: Secondary | ICD-10-CM | POA: Diagnosis not present

## 2023-07-18 DIAGNOSIS — M12852 Other specific arthropathies, not elsewhere classified, left hip: Secondary | ICD-10-CM | POA: Diagnosis not present

## 2023-07-18 DIAGNOSIS — J4540 Moderate persistent asthma, uncomplicated: Secondary | ICD-10-CM | POA: Diagnosis not present

## 2023-07-19 DIAGNOSIS — J4540 Moderate persistent asthma, uncomplicated: Secondary | ICD-10-CM | POA: Diagnosis not present

## 2023-07-19 DIAGNOSIS — Z01811 Encounter for preprocedural respiratory examination: Secondary | ICD-10-CM | POA: Diagnosis not present

## 2023-07-19 DIAGNOSIS — G4733 Obstructive sleep apnea (adult) (pediatric): Secondary | ICD-10-CM | POA: Diagnosis not present
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
## 2023-07-24 ENCOUNTER — Ambulatory Visit (INDEPENDENT_AMBULATORY_CARE_PROVIDER_SITE_OTHER)

## 2023-07-24 ENCOUNTER — Ambulatory Visit: Admitting: Family Medicine

## 2023-07-24 ENCOUNTER — Encounter: Payer: Self-pay | Admitting: Family Medicine

## 2023-07-24 VITALS — BP 112/76 | HR 73 | Ht 68.0 in | Wt 272.0 lb

## 2023-07-24 DIAGNOSIS — M79672 Pain in left foot: Secondary | ICD-10-CM | POA: Diagnosis not present

## 2023-07-24 DIAGNOSIS — M461 Sacroiliitis, not elsewhere classified: Secondary | ICD-10-CM

## 2023-07-24 DIAGNOSIS — M9903 Segmental and somatic dysfunction of lumbar region: Secondary | ICD-10-CM | POA: Diagnosis not present

## 2023-07-24 DIAGNOSIS — M9904 Segmental and somatic dysfunction of sacral region: Secondary | ICD-10-CM

## 2023-07-24 DIAGNOSIS — M9908 Segmental and somatic dysfunction of rib cage: Secondary | ICD-10-CM | POA: Diagnosis not present

## 2023-07-24 DIAGNOSIS — M9902 Segmental and somatic dysfunction of thoracic region: Secondary | ICD-10-CM | POA: Diagnosis not present

## 2023-07-24 DIAGNOSIS — M1612 Unilateral primary osteoarthritis, left hip: Secondary | ICD-10-CM

## 2023-07-24 DIAGNOSIS — M9901 Segmental and somatic dysfunction of cervical region: Secondary | ICD-10-CM

## 2023-07-24 NOTE — Assessment & Plan Note (Signed)
 We are waiting to get BMI under 40 so patient can have a replacement.  Patient's BMI is at 41 at the moment.

## 2023-07-24 NOTE — Patient Instructions (Signed)
 [employer]en[employer] orthotics Hoka bondhi or brooks beast Hoka recovery sandals in house Wt Readings from Last 3 Encounters:  07/24/23 272 lb (123.4 kg)  05/21/23 287 lb (130.2 kg)  04/09/23 (!) 300 lb 6.4 oz (136.3 kg)   See you again in 8 weeks

## 2023-07-24 NOTE — Assessment & Plan Note (Signed)
 Patient is doing very well with losing weight.  Continue to increase activity slowly.  Discussed icing regimen and home exercises, discussed which activities to do with which ones to avoid.  Increase activity slowly.  Follow-up again in 6 to 8 weeks

## 2023-07-24 NOTE — Assessment & Plan Note (Signed)
 Continue with home exercises, icing regimen, which activities to do and which ones to avoid.  Increase activity slowly.  Discussed icing regimen.  Follow-up again in 6 to 8 weeks otherwise.

## 2023-07-29 ENCOUNTER — Telehealth: Payer: Self-pay | Admitting: Cardiovascular Disease

## 2023-07-29 NOTE — Telephone Encounter (Signed)
   Pre-operative Risk Assessment    Patient Name: [name]  DOB: [birth year] MRN: [id number]      Request for Surgical Clearance    Procedure:  Left Total Hip Replacement  Date of Surgery:  Clearance TBD                                 Surgeon:  Dr Neil Balls Surgeon's Group or Practice Name:  [facility] Phone number:  [phone] Fax number:  [phone]   Type of Clearance Requested:   - Medical    Type of Anesthesia:  Spinal   Additional requests/questions:    SignedBerniece Brisk   07/29/2023, 8:59 AM

## 2023-07-29 NOTE — Telephone Encounter (Signed)
   Name: [name]  DOB: 27-Jan-1966  MRN: [id number]  Primary Cardiologist: Janelle Mediate, MD  Chart reviewed as part of pre-operative protocol coverage. The patient has an upcoming visit scheduled with Callie Goodrich, PA on 08/22/2023 at which time clearance can be addressed in case there are any issues that would impact surgical recommendations.  Left total hip replacement is not scheduled until TBD as below. I added preop FYI to appointment note so that provider is aware to address at time of outpatient visit.  Per office protocol the cardiology provider should forward their finalized clearance decision and recommendations regarding antiplatelet therapy to the requesting party below.    I will route this message as FYI to requesting party and remove this message from the preop box as separate preop APP input not needed at this time.   Please call with any questions.  Ava Boatman, NP  07/29/2023, 11:10 AM

## 2023-08-07 DIAGNOSIS — M4306 Spondylolysis, lumbar region: Secondary | ICD-10-CM | POA: Diagnosis not present

## 2023-08-07 DIAGNOSIS — M9903 Segmental and somatic dysfunction of lumbar region: Secondary | ICD-10-CM | POA: Diagnosis not present

## 2023-08-07 DIAGNOSIS — M9905 Segmental and somatic dysfunction of pelvic region: Secondary | ICD-10-CM | POA: Diagnosis not present

## 2023-08-07 DIAGNOSIS — M5136 Other intervertebral disc degeneration, lumbar region with discogenic back pain only: Secondary | ICD-10-CM | POA: Diagnosis not present
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
## 2023-08-22 ENCOUNTER — Ambulatory Visit: Attending: Student | Admitting: Physician Assistant

## 2023-08-22 ENCOUNTER — Encounter: Payer: Self-pay | Admitting: Physician Assistant

## 2023-08-22 VITALS — BP 102/74 | HR 72 | Ht 69.0 in | Wt 263.8 lb

## 2023-08-22 DIAGNOSIS — R079 Chest pain, unspecified: Secondary | ICD-10-CM | POA: Diagnosis not present

## 2023-08-22 DIAGNOSIS — I77810 Thoracic aortic ectasia: Secondary | ICD-10-CM | POA: Insufficient documentation

## 2023-08-22 DIAGNOSIS — M25552 Pain in left hip: Secondary | ICD-10-CM | POA: Diagnosis not present

## 2023-08-22 DIAGNOSIS — I272 Pulmonary hypertension, unspecified: Secondary | ICD-10-CM

## 2023-08-22 DIAGNOSIS — Z01818 Encounter for other preprocedural examination: Secondary | ICD-10-CM | POA: Insufficient documentation

## 2023-08-22 DIAGNOSIS — R002 Palpitations: Secondary | ICD-10-CM | POA: Diagnosis not present

## 2023-08-22 NOTE — Patient Instructions (Addendum)
 Medication Instructions:  NONE *If you need a refill on your cardiac medications before your next appointment, please call your pharmacy*  Lab Work: FASTING LIPIDS TODAY  If you have labs (blood work) drawn today and your tests are completely normal, you will receive your results only by: MyChart Message (if you have MyChart) OR A paper copy in the mail If you have any lab test that is abnormal or we need to change your treatment, we will call you to review the results.  Testing/Procedures:  Your physician has requested that you have an echocardiogram. Echocardiography is a painless test that uses sound waves to create images of your heart. It provides your doctor with information about the size and shape of your heart and how well your heart's chambers and valves are working. This procedure takes approximately one hour. There are no restrictions for this procedure.  PLEASE SCHEDULE ECHO TO BE DONE IN NOVEMBER 2025   Please do NOT wear cologne, perfume, aftershave, or lotions (deodorant is allowed). Please arrive 15 minutes prior to your appointment time.  Please note: We ask at that you not bring children with you during ultrasound (echo/ vascular) testing. Due to room size and safety concerns, children are not allowed in the ultrasound rooms during exams. Our front office staff cannot provide observation of children in our lobby area while testing is being conducted. An adult accompanying a patient to their appointment will only be allowed in the ultrasound room at the discretion of the ultrasound technician under special circumstances. We apologize for any inconvenience.   Follow-Up: At [facility], you and your health needs are our priority.  As part of our continuing mission to provide you with exceptional heart care, our providers are all part of one team.  This team includes your primary Cardiologist (physician) and Advanced Practice Providers or APPs (Physician Assistants and  Nurse Practitioners) who all work together to provide you with the care you need, when you need it.  Your next appointment:   1 year(s)  Provider:   Janelle Mediate, MD or One of our Advanced Practice Providers (APPs): Melita Springer, PA-C  Friddie Jetty, NP Evaline Hill, NP  Theotis Flake, PA-C Lawana Pray, NP  Willis Harter, PA-C Lovette Rud, PA-C  [place], [place] Charles Connor, NP  Marlana Silvan, NP Marcie Sever, PA-C  Laquita Plant, PA-C    Dayna Dunn, PA-C  Marlyse Single, PA-C Palmer Bobo, NP Katlyn West, NP Callie Goodrich, PA-C  Evan Williams, PA-C Sheng Haley, PA-C  Xika Zhao, NP Kathleen Johnson, PA-C

## 2023-08-23 ENCOUNTER — Ambulatory Visit: Payer: Self-pay | Admitting: Physician Assistant

## 2023-08-23 ENCOUNTER — Encounter: Payer: Self-pay | Admitting: Physician Assistant

## 2023-08-23 LAB — LIPID PANEL
Chol/HDL Ratio: 5.3 ratio — ABNORMAL HIGH (ref 0.0–5.0)
Cholesterol, Total: 202 mg/dL — ABNORMAL HIGH (ref 100–199)
HDL: 38 mg/dL — ABNORMAL LOW (ref >39)
LDL Chol Calc (NIH): 147 mg/dL — ABNORMAL HIGH (ref 0–99)
Triglycerides: 95 mg/dL (ref 0–149)
VLDL Cholesterol Cal: 17 mg/dL (ref 5–40)

## 2023-08-23 NOTE — Telephone Encounter (Signed)
 Called and spoke to pt. DOB and full name verified. Discussed result comment and pt would like to discuss with wife on starting a statin. Pt requested this info in a voice message so he can relay information to spouse.   VM left on mobile number (OK per pt) Pt to call back with answer to start statin or not.  ----- Message from Metta Actis sent at 08/23/2023  4:21 PM EDT ----- Please notify patient: Lipid panel was reviewed and shows that his bad cholesterol (LDL) was elevated to 147, which is slightly higher than previous LDL 135 in 2022. His LDL goal is < 100. We discussed starting statin during office visit. If he is still willing to start statin, please order Crestor 20 mg daily at night. Recommend follow up labs in 12/2023 for fasting lipid panel to recheck LDL and CMP to recheck liver enzymes (AST, ALT), which can be affected by statins. Please order FLP and CMP for 12/2023. Please contact office if he develop any severe muscle aches, which is a common side effect of statins.

## 2023-08-23 NOTE — Telephone Encounter (Signed)
-----   Message from Metta Actis sent at 08/23/2023  4:21 PM EDT ----- Please notify patient: Lipid panel was reviewed and shows that his bad cholesterol (LDL) was elevated to 147, which is slightly higher than previous LDL 135 in 2022. His LDL goal is < 100. We  discussed starting statin during office visit. If he is still willing to start statin, please order Crestor 20 mg daily at night. Recommend follow up labs in 12/2023 for fasting lipid panel to  recheck LDL  and CMP to recheck liver enzymes (AST, ALT), which can be affected by statins. Please order FLP and CMP for 12/2023. Please contact office if he develop any severe muscle aches, which is  a common side effect of statins.  ----- Message ----- From: Interface, [facility] Lab Results In Sent: 08/23/2023   1:35 AM EDT To: Metta Actis, PA-C

## 2023-08-29 DIAGNOSIS — M25652 Stiffness of left hip, not elsewhere classified: Secondary | ICD-10-CM | POA: Diagnosis not present

## 2023-08-29 DIAGNOSIS — M1612 Unilateral primary osteoarthritis, left hip: Secondary | ICD-10-CM | POA: Diagnosis not present

## 2023-08-29 DIAGNOSIS — R531 Weakness: Secondary | ICD-10-CM | POA: Diagnosis not present

## 2023-08-30 DIAGNOSIS — J4540 Moderate persistent asthma, uncomplicated: Secondary | ICD-10-CM | POA: Diagnosis not present

## 2023-08-30 DIAGNOSIS — J310 Chronic rhinitis: Secondary | ICD-10-CM | POA: Diagnosis not present

## 2023-08-30 DIAGNOSIS — G4733 Obstructive sleep apnea (adult) (pediatric): Secondary | ICD-10-CM | POA: Diagnosis not present

## 2023-09-12 DIAGNOSIS — M1612 Unilateral primary osteoarthritis, left hip: Secondary | ICD-10-CM | POA: Diagnosis not present
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
## 2023-09-18 ENCOUNTER — Encounter: Payer: Self-pay | Admitting: Family Medicine

## 2023-09-18 ENCOUNTER — Ambulatory Visit: Admitting: Family Medicine

## 2023-09-18 VITALS — BP 104/64 | HR 54 | Ht 69.0 in | Wt 255.0 lb

## 2023-09-18 DIAGNOSIS — M9903 Segmental and somatic dysfunction of lumbar region: Secondary | ICD-10-CM | POA: Diagnosis not present

## 2023-09-18 DIAGNOSIS — M9904 Segmental and somatic dysfunction of sacral region: Secondary | ICD-10-CM

## 2023-09-18 DIAGNOSIS — M9908 Segmental and somatic dysfunction of rib cage: Secondary | ICD-10-CM

## 2023-09-18 DIAGNOSIS — M9901 Segmental and somatic dysfunction of cervical region: Secondary | ICD-10-CM | POA: Diagnosis not present

## 2023-09-18 DIAGNOSIS — M9902 Segmental and somatic dysfunction of thoracic region: Secondary | ICD-10-CM | POA: Diagnosis not present

## 2023-09-18 DIAGNOSIS — M461 Sacroiliitis, not elsewhere classified: Secondary | ICD-10-CM | POA: Diagnosis not present

## 2023-09-18 NOTE — Assessment & Plan Note (Signed)
 Has done wonderful job getting the BMI under 40 and is now at 37.

## 2023-09-18 NOTE — Patient Instructions (Signed)
 Good to see you! Proud of everything you've done See you again beginning of October

## 2023-09-18 NOTE — Assessment & Plan Note (Signed)
 History of a sacroiliitis, is going to have hip replacement in the near future.  Responded extremely well to the manipulation.  Discussed which activities to do and which ones to avoid.  Increase activity slowly.  Follow-up again in 8 weeks after his hip replacement.

## 2023-09-25 ENCOUNTER — Other Ambulatory Visit: Payer: Self-pay | Admitting: Family Medicine

## 2023-10-02 DIAGNOSIS — Z96642 Presence of left artificial hip joint: Secondary | ICD-10-CM | POA: Diagnosis not present

## 2023-10-02 DIAGNOSIS — M1612 Unilateral primary osteoarthritis, left hip: Secondary | ICD-10-CM | POA: Diagnosis not present

## 2023-10-04 DIAGNOSIS — M25552 Pain in left hip: Secondary | ICD-10-CM | POA: Diagnosis not present

## 2023-10-04 DIAGNOSIS — Z96642 Presence of left artificial hip joint: Secondary | ICD-10-CM | POA: Diagnosis not present

## 2023-10-04 DIAGNOSIS — R262 Difficulty in walking, not elsewhere classified: Secondary | ICD-10-CM | POA: Diagnosis not present

## 2023-10-08 DIAGNOSIS — Z96642 Presence of left artificial hip joint: Secondary | ICD-10-CM | POA: Diagnosis not present

## 2023-10-08 DIAGNOSIS — R262 Difficulty in walking, not elsewhere classified: Secondary | ICD-10-CM | POA: Diagnosis not present

## 2023-10-08 DIAGNOSIS — M25552 Pain in left hip: Secondary | ICD-10-CM | POA: Diagnosis not present

## 2023-10-10 DIAGNOSIS — M25552 Pain in left hip: Secondary | ICD-10-CM | POA: Diagnosis not present

## 2023-10-10 DIAGNOSIS — Z96642 Presence of left artificial hip joint: Secondary | ICD-10-CM | POA: Diagnosis not present

## 2023-10-10 DIAGNOSIS — R262 Difficulty in walking, not elsewhere classified: Secondary | ICD-10-CM | POA: Diagnosis not present

## 2023-10-14 DIAGNOSIS — Z471 Aftercare following joint replacement surgery: Secondary | ICD-10-CM | POA: Diagnosis not present

## 2023-10-14 DIAGNOSIS — M1612 Unilateral primary osteoarthritis, left hip: Secondary | ICD-10-CM | POA: Diagnosis not present

## 2023-10-17 DIAGNOSIS — M25552 Pain in left hip: Secondary | ICD-10-CM | POA: Diagnosis not present

## 2023-10-17 DIAGNOSIS — Z96642 Presence of left artificial hip joint: Secondary | ICD-10-CM | POA: Diagnosis not present

## 2023-10-17 DIAGNOSIS — R262 Difficulty in walking, not elsewhere classified: Secondary | ICD-10-CM | POA: Diagnosis not present

## 2023-10-22 DIAGNOSIS — Z96642 Presence of left artificial hip joint: Secondary | ICD-10-CM | POA: Diagnosis not present

## 2023-10-22 DIAGNOSIS — R262 Difficulty in walking, not elsewhere classified: Secondary | ICD-10-CM | POA: Diagnosis not present

## 2023-10-22 DIAGNOSIS — M25552 Pain in left hip: Secondary | ICD-10-CM | POA: Diagnosis not present

## 2023-10-24 DIAGNOSIS — M25552 Pain in left hip: Secondary | ICD-10-CM | POA: Diagnosis not present

## 2023-10-24 DIAGNOSIS — R262 Difficulty in walking, not elsewhere classified: Secondary | ICD-10-CM | POA: Diagnosis not present

## 2023-10-24 DIAGNOSIS — Z96642 Presence of left artificial hip joint: Secondary | ICD-10-CM | POA: Diagnosis not present

## 2023-10-29 DIAGNOSIS — R262 Difficulty in walking, not elsewhere classified: Secondary | ICD-10-CM | POA: Diagnosis not present

## 2023-10-29 DIAGNOSIS — M25552 Pain in left hip: Secondary | ICD-10-CM | POA: Diagnosis not present

## 2023-10-29 DIAGNOSIS — Z96642 Presence of left artificial hip joint: Secondary | ICD-10-CM | POA: Diagnosis not present

## 2023-10-30 ENCOUNTER — Telehealth: Payer: Self-pay | Admitting: Family Medicine

## 2023-10-30 ENCOUNTER — Other Ambulatory Visit: Payer: Self-pay

## 2023-10-30 MED ORDER — METHOCARBAMOL 500 MG PO TABS: 500.0000 mg | ORAL_TABLET | Freq: Every day | ORAL | 0 refills | Status: DC

## 2023-10-30 NOTE — Telephone Encounter (Signed)
 Attempted to call patient. Could not leave VM.

## 2023-10-30 NOTE — Telephone Encounter (Signed)
 Patient called and states that he reached out to his pharmacy to get a refill on his muscle relaxer and they told him to call his doctor. Patient would like someone to call him to let him know if this has been sent. Please advise.

## 2023-10-31 DIAGNOSIS — R262 Difficulty in walking, not elsewhere classified: Secondary | ICD-10-CM | POA: Diagnosis not present

## 2023-10-31 DIAGNOSIS — Z96642 Presence of left artificial hip joint: Secondary | ICD-10-CM | POA: Diagnosis not present

## 2023-10-31 DIAGNOSIS — M25552 Pain in left hip: Secondary | ICD-10-CM | POA: Diagnosis not present

## 2023-11-01 DIAGNOSIS — G4733 Obstructive sleep apnea (adult) (pediatric): Secondary | ICD-10-CM | POA: Diagnosis not present

## 2023-11-01 DIAGNOSIS — J4540 Moderate persistent asthma, uncomplicated: Secondary | ICD-10-CM | POA: Diagnosis not present

## 2023-11-01 DIAGNOSIS — J310 Chronic rhinitis: Secondary | ICD-10-CM | POA: Diagnosis not present

## 2023-11-04 DIAGNOSIS — M25552 Pain in left hip: Secondary | ICD-10-CM | POA: Diagnosis not present

## 2023-11-04 DIAGNOSIS — Z96642 Presence of left artificial hip joint: Secondary | ICD-10-CM | POA: Diagnosis not present

## 2023-11-04 DIAGNOSIS — R262 Difficulty in walking, not elsewhere classified: Secondary | ICD-10-CM | POA: Diagnosis not present

## 2023-11-06 DIAGNOSIS — M25552 Pain in left hip: Secondary | ICD-10-CM | POA: Diagnosis not present

## 2023-11-06 DIAGNOSIS — R262 Difficulty in walking, not elsewhere classified: Secondary | ICD-10-CM | POA: Diagnosis not present

## 2023-11-06 DIAGNOSIS — Z96642 Presence of left artificial hip joint: Secondary | ICD-10-CM | POA: Diagnosis not present

## 2023-11-12 DIAGNOSIS — M1612 Unilateral primary osteoarthritis, left hip: Secondary | ICD-10-CM | POA: Diagnosis not present

## 2023-11-13 DIAGNOSIS — R262 Difficulty in walking, not elsewhere classified: Secondary | ICD-10-CM | POA: Diagnosis not present

## 2023-11-13 DIAGNOSIS — Z96642 Presence of left artificial hip joint: Secondary | ICD-10-CM | POA: Diagnosis not present

## 2023-11-13 DIAGNOSIS — M25552 Pain in left hip: Secondary | ICD-10-CM | POA: Diagnosis not present

## 2023-11-15 DIAGNOSIS — Z96642 Presence of left artificial hip joint: Secondary | ICD-10-CM | POA: Diagnosis not present

## 2023-11-15 DIAGNOSIS — R262 Difficulty in walking, not elsewhere classified: Secondary | ICD-10-CM | POA: Diagnosis not present

## 2023-11-15 DIAGNOSIS — M25552 Pain in left hip: Secondary | ICD-10-CM | POA: Diagnosis not present

## 2023-11-19 DIAGNOSIS — M25552 Pain in left hip: Secondary | ICD-10-CM | POA: Diagnosis not present

## 2023-11-19 DIAGNOSIS — Z96642 Presence of left artificial hip joint: Secondary | ICD-10-CM | POA: Diagnosis not present

## 2023-11-19 DIAGNOSIS — R262 Difficulty in walking, not elsewhere classified: Secondary | ICD-10-CM | POA: Diagnosis not present

## 2023-11-21 DIAGNOSIS — R262 Difficulty in walking, not elsewhere classified: Secondary | ICD-10-CM | POA: Diagnosis not present

## 2023-11-21 DIAGNOSIS — M25552 Pain in left hip: Secondary | ICD-10-CM | POA: Diagnosis not present

## 2023-11-21 DIAGNOSIS — Z96642 Presence of left artificial hip joint: Secondary | ICD-10-CM | POA: Diagnosis not present

## 2023-11-26 DIAGNOSIS — R262 Difficulty in walking, not elsewhere classified: Secondary | ICD-10-CM | POA: Diagnosis not present

## 2023-11-26 DIAGNOSIS — Z96642 Presence of left artificial hip joint: Secondary | ICD-10-CM | POA: Diagnosis not present

## 2023-11-26 DIAGNOSIS — M25552 Pain in left hip: Secondary | ICD-10-CM | POA: Diagnosis not present

## 2023-11-28 DIAGNOSIS — Z96642 Presence of left artificial hip joint: Secondary | ICD-10-CM | POA: Diagnosis not present

## 2023-11-28 DIAGNOSIS — M25552 Pain in left hip: Secondary | ICD-10-CM | POA: Diagnosis not present

## 2023-11-28 DIAGNOSIS — R262 Difficulty in walking, not elsewhere classified: Secondary | ICD-10-CM | POA: Diagnosis not present

## 2023-12-02 DIAGNOSIS — Z23 Encounter for immunization: Secondary | ICD-10-CM | POA: Diagnosis not present

## 2023-12-02 DIAGNOSIS — Z6836 Body mass index (BMI) 36.0-36.9, adult: Secondary | ICD-10-CM | POA: Diagnosis not present

## 2023-12-02 DIAGNOSIS — J4540 Moderate persistent asthma, uncomplicated: Secondary | ICD-10-CM | POA: Diagnosis not present

## 2023-12-02 DIAGNOSIS — F5101 Primary insomnia: Secondary | ICD-10-CM | POA: Diagnosis not present

## 2023-12-02 DIAGNOSIS — G4733 Obstructive sleep apnea (adult) (pediatric): Secondary | ICD-10-CM | POA: Diagnosis not present

## 2023-12-02 DIAGNOSIS — Z79899 Other long term (current) drug therapy: Secondary | ICD-10-CM | POA: Diagnosis not present

## 2023-12-03 DIAGNOSIS — R262 Difficulty in walking, not elsewhere classified: Secondary | ICD-10-CM | POA: Diagnosis not present

## 2023-12-03 DIAGNOSIS — Z96642 Presence of left artificial hip joint: Secondary | ICD-10-CM | POA: Diagnosis not present

## 2023-12-03 DIAGNOSIS — M25552 Pain in left hip: Secondary | ICD-10-CM | POA: Diagnosis not present

## 2023-12-05 DIAGNOSIS — R262 Difficulty in walking, not elsewhere classified: Secondary | ICD-10-CM | POA: Diagnosis not present

## 2023-12-05 DIAGNOSIS — M25552 Pain in left hip: Secondary | ICD-10-CM | POA: Diagnosis not present

## 2023-12-05 DIAGNOSIS — Z96642 Presence of left artificial hip joint: Secondary | ICD-10-CM | POA: Diagnosis not present

## 2023-12-10 DIAGNOSIS — Z96642 Presence of left artificial hip joint: Secondary | ICD-10-CM | POA: Diagnosis not present

## 2023-12-10 DIAGNOSIS — R262 Difficulty in walking, not elsewhere classified: Secondary | ICD-10-CM | POA: Diagnosis not present

## 2023-12-10 DIAGNOSIS — M25552 Pain in left hip: Secondary | ICD-10-CM | POA: Diagnosis not present
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
## 2023-12-11 ENCOUNTER — Ambulatory Visit: Admitting: Family Medicine

## 2023-12-11 ENCOUNTER — Other Ambulatory Visit: Payer: Self-pay

## 2023-12-11 ENCOUNTER — Encounter: Payer: Self-pay | Admitting: Family Medicine

## 2023-12-11 VITALS — BP 100/70 | HR 95 | Ht 69.0 in | Wt 244.0 lb

## 2023-12-11 DIAGNOSIS — G8929 Other chronic pain: Secondary | ICD-10-CM | POA: Diagnosis not present

## 2023-12-11 DIAGNOSIS — M7121 Synovial cyst of popliteal space [Baker], right knee: Secondary | ICD-10-CM | POA: Insufficient documentation

## 2023-12-11 DIAGNOSIS — M25561 Pain in right knee: Secondary | ICD-10-CM

## 2023-12-11 MED ORDER — METHOCARBAMOL 500 MG PO TABS: 500.0000 mg | ORAL_TABLET | Freq: Every day | ORAL | 0 refills | Status: DC

## 2023-12-11 NOTE — Patient Instructions (Signed)
 Drained cyst See me again in 3 months

## 2023-12-11 NOTE — Assessment & Plan Note (Signed)
 Aspiration done today, patient will monitor for any signs of inflammation or any infectious etiology.  Seem to be more ganglion like material and could have reaccumulation occur again.  Patient is getting better from his hip replacement and do think that there was an exacerbation secondary to him and how he had to walk with it.  Follow-up with me again in 6 to 8 weeks otherwise.

## 2023-12-12 DIAGNOSIS — Z96642 Presence of left artificial hip joint: Secondary | ICD-10-CM | POA: Diagnosis not present

## 2023-12-12 DIAGNOSIS — R262 Difficulty in walking, not elsewhere classified: Secondary | ICD-10-CM | POA: Diagnosis not present

## 2023-12-12 DIAGNOSIS — M25552 Pain in left hip: Secondary | ICD-10-CM | POA: Diagnosis not present

## 2023-12-17 DIAGNOSIS — M25552 Pain in left hip: Secondary | ICD-10-CM | POA: Diagnosis not present

## 2023-12-17 DIAGNOSIS — R262 Difficulty in walking, not elsewhere classified: Secondary | ICD-10-CM | POA: Diagnosis not present

## 2023-12-17 DIAGNOSIS — Z96642 Presence of left artificial hip joint: Secondary | ICD-10-CM | POA: Diagnosis not present

## 2023-12-19 DIAGNOSIS — R262 Difficulty in walking, not elsewhere classified: Secondary | ICD-10-CM | POA: Diagnosis not present

## 2023-12-19 DIAGNOSIS — M25552 Pain in left hip: Secondary | ICD-10-CM | POA: Diagnosis not present

## 2023-12-19 DIAGNOSIS — Z96642 Presence of left artificial hip joint: Secondary | ICD-10-CM | POA: Diagnosis not present

## 2023-12-24 DIAGNOSIS — R262 Difficulty in walking, not elsewhere classified: Secondary | ICD-10-CM | POA: Diagnosis not present

## 2023-12-24 DIAGNOSIS — Z96642 Presence of left artificial hip joint: Secondary | ICD-10-CM | POA: Diagnosis not present

## 2023-12-24 DIAGNOSIS — M25552 Pain in left hip: Secondary | ICD-10-CM | POA: Diagnosis not present

## 2023-12-26 DIAGNOSIS — Z96642 Presence of left artificial hip joint: Secondary | ICD-10-CM | POA: Diagnosis not present

## 2023-12-26 DIAGNOSIS — R262 Difficulty in walking, not elsewhere classified: Secondary | ICD-10-CM | POA: Diagnosis not present

## 2023-12-26 DIAGNOSIS — M25552 Pain in left hip: Secondary | ICD-10-CM | POA: Diagnosis not present

## 2023-12-31 DIAGNOSIS — R262 Difficulty in walking, not elsewhere classified: Secondary | ICD-10-CM | POA: Diagnosis not present

## 2023-12-31 DIAGNOSIS — M25552 Pain in left hip: Secondary | ICD-10-CM | POA: Diagnosis not present

## 2023-12-31 DIAGNOSIS — Z96642 Presence of left artificial hip joint: Secondary | ICD-10-CM | POA: Diagnosis not present

## 2024-01-02 DIAGNOSIS — M25552 Pain in left hip: Secondary | ICD-10-CM | POA: Diagnosis not present

## 2024-01-02 DIAGNOSIS — M1611 Unilateral primary osteoarthritis, right hip: Secondary | ICD-10-CM | POA: Diagnosis not present

## 2024-01-02 DIAGNOSIS — R262 Difficulty in walking, not elsewhere classified: Secondary | ICD-10-CM | POA: Diagnosis not present

## 2024-01-02 DIAGNOSIS — Z96642 Presence of left artificial hip joint: Secondary | ICD-10-CM | POA: Diagnosis not present

## 2024-01-08 ENCOUNTER — Encounter: Payer: Self-pay | Admitting: *Deleted

## 2024-01-09 DIAGNOSIS — M25552 Pain in left hip: Secondary | ICD-10-CM | POA: Diagnosis not present

## 2024-01-09 DIAGNOSIS — Z96642 Presence of left artificial hip joint: Secondary | ICD-10-CM | POA: Diagnosis not present

## 2024-01-09 DIAGNOSIS — R262 Difficulty in walking, not elsewhere classified: Secondary | ICD-10-CM | POA: Diagnosis not present

## 2024-01-10 DIAGNOSIS — F411 Generalized anxiety disorder: Secondary | ICD-10-CM | POA: Diagnosis not present
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
## 2024-01-14 ENCOUNTER — Encounter: Payer: Self-pay | Admitting: Family Medicine

## 2024-01-14 ENCOUNTER — Ambulatory Visit: Admitting: Family Medicine

## 2024-01-14 VITALS — BP 118/72 | HR 82 | Ht 69.0 in | Wt 245.0 lb

## 2024-01-14 DIAGNOSIS — M9902 Segmental and somatic dysfunction of thoracic region: Secondary | ICD-10-CM | POA: Diagnosis not present

## 2024-01-14 DIAGNOSIS — M9903 Segmental and somatic dysfunction of lumbar region: Secondary | ICD-10-CM

## 2024-01-14 DIAGNOSIS — M461 Sacroiliitis, not elsewhere classified: Secondary | ICD-10-CM | POA: Diagnosis not present

## 2024-01-14 DIAGNOSIS — M9904 Segmental and somatic dysfunction of sacral region: Secondary | ICD-10-CM

## 2024-01-14 NOTE — Assessment & Plan Note (Signed)
 Sacroiliac joints..  Encourage patient continue to monitor.  Patient is doing relatively well and needs 10 pounds since last 4 months.  Her exercise which I think will be more beneficial as well.  Follow-up with me again in 6 to 8 weeks

## 2024-01-14 NOTE — Patient Instructions (Signed)
 Flonase  each nostril daily for 2 weeks Keep increasing activity and start walking See you again in 2 months

## 2024-01-15 ENCOUNTER — Ambulatory Visit (HOSPITAL_COMMUNITY)
Admission: RE | Admit: 2024-01-15 | Discharge: 2024-01-15 | Disposition: A | Discharge status: Home / Self Care | Source: Ambulatory Visit | Attending: Cardiology | Admitting: Cardiology

## 2024-01-15 DIAGNOSIS — F5101 Primary insomnia: Secondary | ICD-10-CM | POA: Diagnosis not present

## 2024-01-15 DIAGNOSIS — I77810 Thoracic aortic ectasia: Secondary | ICD-10-CM | POA: Diagnosis not present

## 2024-01-15 DIAGNOSIS — J4540 Moderate persistent asthma, uncomplicated: Secondary | ICD-10-CM | POA: Diagnosis not present

## 2024-01-15 DIAGNOSIS — I340 Nonrheumatic mitral (valve) insufficiency: Secondary | ICD-10-CM

## 2024-01-15 DIAGNOSIS — Z6837 Body mass index (BMI) 37.0-37.9, adult: Secondary | ICD-10-CM | POA: Diagnosis not present

## 2024-01-15 DIAGNOSIS — Z0000 Encounter for general adult medical examination without abnormal findings: Secondary | ICD-10-CM | POA: Diagnosis not present

## 2024-01-15 DIAGNOSIS — Z79899 Other long term (current) drug therapy: Secondary | ICD-10-CM | POA: Diagnosis not present

## 2024-01-15 DIAGNOSIS — G4733 Obstructive sleep apnea (adult) (pediatric): Secondary | ICD-10-CM | POA: Insufficient documentation

## 2024-01-15 LAB — ECHOCARDIOGRAM COMPLETE
Area-P 1/2: 4.4 cm2
S' Lateral: 2.7 cm

## 2024-01-16 ENCOUNTER — Other Ambulatory Visit: Payer: Self-pay | Admitting: Family Medicine

## 2024-01-17 NOTE — Telephone Encounter (Signed)
 Called pt to discuss test results. Unable to contact pt. 1st attempt

## 2024-01-20 NOTE — Telephone Encounter (Signed)
 Called and spoke with patient regarding his recent ECHOCARDIOGRAM results. Patient has some additional questions about the size of the aortic root dilation going from 37mm to 41mm and although I tried to explain it to him that it is mild, per result from Mrs. [place], [place], he still would like to speak to a provider more. Please contact patient at [phone]. Thank you.

## 2024-02-11 DIAGNOSIS — Z96642 Presence of left artificial hip joint: Secondary | ICD-10-CM | POA: Diagnosis not present

## 2024-02-21 DIAGNOSIS — F411 Generalized anxiety disorder: Secondary | ICD-10-CM | POA: Diagnosis not present
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
## 2024-03-17 ENCOUNTER — Ambulatory Visit: Admitting: Family Medicine

## 2024-03-17 ENCOUNTER — Encounter: Payer: Self-pay | Admitting: Family Medicine

## 2024-03-17 VITALS — BP 104/70 | HR 87 | Ht 69.0 in | Wt 255.0 lb

## 2024-03-17 DIAGNOSIS — M9902 Segmental and somatic dysfunction of thoracic region: Secondary | ICD-10-CM

## 2024-03-17 DIAGNOSIS — M9903 Segmental and somatic dysfunction of lumbar region: Secondary | ICD-10-CM | POA: Diagnosis not present

## 2024-03-17 DIAGNOSIS — M9908 Segmental and somatic dysfunction of rib cage: Secondary | ICD-10-CM

## 2024-03-17 DIAGNOSIS — M9904 Segmental and somatic dysfunction of sacral region: Secondary | ICD-10-CM | POA: Diagnosis not present

## 2024-03-17 DIAGNOSIS — M9901 Segmental and somatic dysfunction of cervical region: Secondary | ICD-10-CM | POA: Diagnosis not present

## 2024-03-17 DIAGNOSIS — M5134 Other intervertebral disc degeneration, thoracic region: Secondary | ICD-10-CM | POA: Diagnosis not present

## 2024-03-17 NOTE — Patient Instructions (Signed)
 Hands in peripheral vision Voltaren gel See me in 2 weeks

## 2024-03-17 NOTE — Assessment & Plan Note (Signed)
 Known tightness with arthritic changes.  Discussed icing regimen and home exercises, discussed which activities to do and which ones to avoid.  Increase activity slowly.  Discussed icing regimen.  Continue to work on raytheon which patient has gained 10 pounds.  I am optimistic that patient should do well.  Will start to be more active again.  Follow-up again in 6 to 12 weeks

## 2024-05-13 ENCOUNTER — Ambulatory Visit: Admitting: Family Medicine
